# Patient Record
Sex: Female | Born: 1965
Health system: Southern US, Community
[De-identification: ages and names within clinical notes are randomized; demographics above are authoritative.]

## PROBLEM LIST (undated history)

## (undated) DIAGNOSIS — E785 Hyperlipidemia, unspecified: Secondary | ICD-10-CM

## (undated) DIAGNOSIS — M1990 Unspecified osteoarthritis, unspecified site: Secondary | ICD-10-CM

## (undated) DIAGNOSIS — E039 Hypothyroidism, unspecified: Secondary | ICD-10-CM

## (undated) DIAGNOSIS — L409 Psoriasis, unspecified: Secondary | ICD-10-CM

## (undated) DIAGNOSIS — N920 Excessive and frequent menstruation with regular cycle: Secondary | ICD-10-CM

## (undated) DIAGNOSIS — K219 Gastro-esophageal reflux disease without esophagitis: Secondary | ICD-10-CM

## (undated) DIAGNOSIS — K589 Irritable bowel syndrome without diarrhea: Secondary | ICD-10-CM

## (undated) DIAGNOSIS — F329 Major depressive disorder, single episode, unspecified: Secondary | ICD-10-CM

## (undated) DIAGNOSIS — G43909 Migraine, unspecified, not intractable, without status migrainosus: Secondary | ICD-10-CM

## (undated) DIAGNOSIS — E119 Type 2 diabetes mellitus without complications: Secondary | ICD-10-CM

## (undated) DIAGNOSIS — F419 Anxiety disorder, unspecified: Secondary | ICD-10-CM

## (undated) DIAGNOSIS — I10 Essential (primary) hypertension: Secondary | ICD-10-CM

## (undated) DIAGNOSIS — F32A Depression, unspecified: Secondary | ICD-10-CM

## (undated) HISTORY — PX: JOINT REPLACEMENT: SHX530

## (undated) HISTORY — PX: KNEE ARTHROSCOPY: SHX127

## (undated) HISTORY — PX: KNEE ARTHROPLASTY: SHX992

## (undated) HISTORY — DX: Hyperlipidemia, unspecified: E78.5

## (undated) HISTORY — DX: Psoriasis, unspecified: L40.9

## (undated) HISTORY — DX: Essential (primary) hypertension: I10

---
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
## 1998-01-08 ENCOUNTER — Encounter: Admission: RE | Admit: 1998-01-08 | Discharge: 1998-01-08 | Payer: Self-pay | Admitting: Hematology and Oncology

## 1998-02-24 ENCOUNTER — Encounter: Admission: RE | Admit: 1998-02-24 | Discharge: 1998-02-24 | Payer: Self-pay | Admitting: Obstetrics & Gynecology

## 1998-06-16 ENCOUNTER — Encounter: Admission: RE | Admit: 1998-06-16 | Discharge: 1998-06-16 | Payer: Self-pay | Admitting: Hematology and Oncology

## 1998-06-26 ENCOUNTER — Encounter: Admission: RE | Admit: 1998-06-26 | Discharge: 1998-06-26 | Payer: Self-pay | Admitting: Internal Medicine

## 1998-08-25 ENCOUNTER — Encounter: Admission: RE | Admit: 1998-08-25 | Discharge: 1998-08-25 | Payer: Self-pay | Admitting: Internal Medicine

## 1998-11-04 ENCOUNTER — Encounter: Admission: RE | Admit: 1998-11-04 | Discharge: 1998-11-04 | Payer: Self-pay | Admitting: Internal Medicine

## 1998-11-18 ENCOUNTER — Encounter: Admission: RE | Admit: 1998-11-18 | Discharge: 1998-11-18 | Payer: Self-pay | Admitting: Internal Medicine

## 1999-01-27 ENCOUNTER — Encounter: Admission: RE | Admit: 1999-01-27 | Discharge: 1999-01-27 | Payer: Self-pay | Admitting: Obstetrics

## 1999-07-13 ENCOUNTER — Encounter: Admission: RE | Admit: 1999-07-13 | Discharge: 1999-07-13 | Payer: Self-pay | Admitting: Internal Medicine

## 1999-08-19 ENCOUNTER — Encounter: Admission: RE | Admit: 1999-08-19 | Discharge: 1999-08-19 | Payer: Self-pay | Admitting: Internal Medicine

## 1999-08-19 ENCOUNTER — Ambulatory Visit (HOSPITAL_COMMUNITY): Admission: RE | Admit: 1999-08-19 | Discharge: 1999-08-19 | Payer: Self-pay | Admitting: Internal Medicine

## 1999-08-19 ENCOUNTER — Encounter: Payer: Self-pay | Admitting: Internal Medicine

## 1999-11-12 ENCOUNTER — Encounter: Admission: RE | Admit: 1999-11-12 | Discharge: 1999-11-12 | Payer: Self-pay | Admitting: Internal Medicine

## 2000-06-19 ENCOUNTER — Encounter: Admission: RE | Admit: 2000-06-19 | Discharge: 2000-06-19 | Payer: Self-pay | Admitting: Internal Medicine

## 2000-06-26 ENCOUNTER — Inpatient Hospital Stay (HOSPITAL_COMMUNITY): Admission: AD | Admit: 2000-06-26 | Discharge: 2000-06-26 | Payer: Self-pay | Admitting: Obstetrics

## 2001-02-01 ENCOUNTER — Encounter: Admission: RE | Admit: 2001-02-01 | Discharge: 2001-03-13 | Payer: Self-pay | Admitting: Orthopedic Surgery

## 2001-11-26 ENCOUNTER — Other Ambulatory Visit: Admission: RE | Admit: 2001-11-26 | Discharge: 2001-11-26 | Payer: Self-pay | Admitting: Obstetrics and Gynecology

## 2002-12-09 ENCOUNTER — Other Ambulatory Visit: Admission: RE | Admit: 2002-12-09 | Discharge: 2002-12-09 | Payer: Self-pay | Admitting: Obstetrics and Gynecology

## 2002-12-27 ENCOUNTER — Encounter: Admission: RE | Admit: 2002-12-27 | Discharge: 2002-12-27 | Payer: Self-pay | Admitting: Gastroenterology

## 2002-12-27 ENCOUNTER — Encounter: Payer: Self-pay | Admitting: Gastroenterology

## 2005-04-18 ENCOUNTER — Other Ambulatory Visit: Admission: RE | Admit: 2005-04-18 | Discharge: 2005-04-18 | Payer: Self-pay | Admitting: Obstetrics and Gynecology

## 2006-03-24 ENCOUNTER — Ambulatory Visit (HOSPITAL_COMMUNITY): Admission: RE | Admit: 2006-03-24 | Discharge: 2006-03-24 | Payer: Self-pay | Admitting: Gastroenterology

## 2006-05-01 ENCOUNTER — Ambulatory Visit (HOSPITAL_COMMUNITY): Admission: RE | Admit: 2006-05-01 | Discharge: 2006-05-01 | Payer: Self-pay | Admitting: Gastroenterology

## 2006-05-22 ENCOUNTER — Other Ambulatory Visit: Admission: RE | Admit: 2006-05-22 | Discharge: 2006-05-22 | Payer: Self-pay | Admitting: Obstetrics and Gynecology

## 2008-03-03 ENCOUNTER — Encounter: Admission: RE | Admit: 2008-03-03 | Discharge: 2008-03-03 | Payer: Self-pay | Admitting: Internal Medicine

## 2008-08-08 ENCOUNTER — Encounter: Admission: RE | Admit: 2008-08-08 | Discharge: 2008-08-08 | Payer: Self-pay | Admitting: Internal Medicine

## 2009-11-11 ENCOUNTER — Emergency Department (HOSPITAL_COMMUNITY): Admission: EM | Admit: 2009-11-11 | Discharge: 2009-11-11 | Payer: Self-pay | Admitting: Emergency Medicine

## 2010-12-23 LAB — POCT I-STAT, CHEM 8
Calcium, Ion: 1 mmol/L — ABNORMAL LOW (ref 1.12–1.32)
Creatinine, Ser: 0.8 mg/dL (ref 0.4–1.2)
Glucose, Bld: 112 mg/dL — ABNORMAL HIGH (ref 70–99)
HCT: 38 % (ref 36.0–46.0)
Hemoglobin: 12.9 g/dL (ref 12.0–15.0)
TCO2: 24 mmol/L (ref 0–100)

## 2010-12-23 LAB — COMPREHENSIVE METABOLIC PANEL
AST: 36 U/L (ref 0–37)
Albumin: 3.8 g/dL (ref 3.5–5.2)
Alkaline Phosphatase: 79 U/L (ref 39–117)
BUN: 11 mg/dL (ref 6–23)
CO2: 25 mEq/L (ref 19–32)
Chloride: 103 mEq/L (ref 96–112)
Creatinine, Ser: 0.74 mg/dL (ref 0.4–1.2)
GFR calc Af Amer: >60 mL/min (ref >60)
GFR calc non Af Amer: >60 mL/min (ref >60)
Potassium: 3.8 mEq/L (ref 3.5–5.1)
Total Bilirubin: 0.5 mg/dL (ref 0.3–1.2)

## 2010-12-23 LAB — CBC
HCT: 38.1 % (ref 36.0–46.0)
MCV: 92.8 fL (ref 78.0–100.0)
Platelets: 286 10*3/uL (ref 150–400)
RBC: 4.1 MIL/uL (ref 3.87–5.11)
WBC: 6.6 10*3/uL (ref 4.0–10.5)

## 2010-12-23 LAB — POCT CARDIAC MARKERS
CKMB, poc: <1 ng/mL — ABNORMAL LOW (ref 1.0–8.0)
CKMB, poc: <1 ng/mL — ABNORMAL LOW (ref 1.0–8.0)
Myoglobin, poc: 74.5 ng/mL (ref 12–200)
Troponin i, poc: <0.05 ng/mL (ref 0.00–0.09)

## 2010-12-23 LAB — DIFFERENTIAL
Basophils Absolute: 0 10*3/uL (ref 0.0–0.1)
Basophils Relative: 0 % (ref 0–1)
Eosinophils Relative: 2 % (ref 0–5)
Lymphocytes Relative: 27 % (ref 12–46)
Monocytes Absolute: 0.4 10*3/uL (ref 0.1–1.0)

## 2010-12-23 LAB — LIPASE, BLOOD: Lipase: 33 U/L (ref 11–59)

## 2011-02-18 NOTE — Op Note (Signed)
NAMEMANETTE, DOTO            ACCOUNT NO.:  [id number]   MEDICAL RECORD NO.:  [id number]          PATIENT TYPE:  AMB   LOCATION:  ENDO                         FACILITY:  [facility]   PHYSICIAN:  Anselmo Rod, M.D.  DATE OF BIRTH:  1966-01-22   DATE OF PROCEDURE:  05/01/2006  DATE OF DISCHARGE:                                 OPERATIVE REPORT   OPERATIVE REPORT:   PROCEDURE PERFORMED:  Screening colonoscopy.   ENDOSCOPIST:  Anselmo Rod, MD   INSTRUMENTS USED:  Olympus video colonoscope.   INDICATION FOR PROCEDURE:  This is a 45 year old white female with a history  of rectal bleeding, rule out colonic polyps, masses, etc.   PREPROCEDURE PREPARATION:  Informed consent was retrieved from the patient.  The patient fasted for 4 hours prior to procedure after being prepped with  32 pills of OsmoPrep the night of and the morning of the procedure.  Risks  and benefits of the procedure, including a 10% missed rate of cancer and  polyp, were discussed with the patient, as well.   BRIEF HISTORY AND PHYSICAL:  VITAL SIGNS:  The patient has stable vital  signs.  NECK:  Supple.  CHEST:  Clear to auscultation.  S1, S2 regular.  ABDOMEN:  Soft with normal bowel sounds.   DESCRIPTION OF PROCEDURE:  The patient was placed in the left lateral  decubitus position, sedated with 150 mcg of fentanyl and 12.5 mg of Versed  in slow, incremental doses intravenously.  Once the patient was adequately  sedated and maintained on low-flow oxygen and continuous cardiac monitoring,  the Olympus video colonoscope was advanced from the rectum to the cecum.  There was some residual stool in the colon.  Multiple washes were done.  The  appendiceal orifice and ileocecal valve were clearly visualized and  photographed.  No masses, polyps, erosions, ulcerations or diverticula were  seen.  There was no evidence of hemorrhoids seen on retroflexion in the  rectum.  There was no evidence of an anal fissure.  No  definite source of  bleeding could be identified.  The terminal ileum appeared healthy and  without lesion.   IMPRESSION:  1.Normal colonoscopy of the terminal ileum.  No masses, polyps,  erosions, ulcerations or diverticula seen.  2.The patient had some abdominal discomfort with insufflation of air into  the colon indicating a component of viceral hypersensitivity, most  consistent with IBS.   RECOMMENDATIONS:  1.Continue on high-fiber diet, with liberal fluid intake.  2.The patient to follow up in the next 2 weeks for repeat guaiac testing.  Further recommendation will be made thereafter.      Anselmo Rod, M.D.  Electronically Signed     JNM/MEDQ  D:  05/01/2006  T:  05/02/2006  Job:  [id number]   cc:   Gabriel Earing, M.D.

## 2013-02-18 ENCOUNTER — Other Ambulatory Visit: Payer: Self-pay | Admitting: Orthopedic Surgery

## 2013-02-19 ENCOUNTER — Encounter (HOSPITAL_COMMUNITY)
Admission: RE | Admit: 2013-02-19 | Discharge: 2013-02-19 | Disposition: A | Discharge status: Home / Self Care | Payer: 59 | Source: Ambulatory Visit | Attending: Orthopedic Surgery | Admitting: Orthopedic Surgery

## 2013-02-19 ENCOUNTER — Ambulatory Visit (HOSPITAL_COMMUNITY)
Admission: RE | Admit: 2013-02-19 | Discharge: 2013-02-19 | Disposition: A | Discharge status: Home / Self Care | Payer: 59 | Source: Ambulatory Visit | Attending: Orthopedic Surgery | Admitting: Orthopedic Surgery

## 2013-02-19 ENCOUNTER — Encounter (HOSPITAL_COMMUNITY): Payer: Self-pay

## 2013-02-19 ENCOUNTER — Encounter (HOSPITAL_COMMUNITY): Payer: Self-pay | Admitting: Pharmacy Technician

## 2013-02-19 DIAGNOSIS — Z01818 Encounter for other preprocedural examination: Secondary | ICD-10-CM | POA: Insufficient documentation

## 2013-02-19 HISTORY — DX: Gastro-esophageal reflux disease without esophagitis: K21.9

## 2013-02-19 HISTORY — DX: Anxiety disorder, unspecified: F41.9

## 2013-02-19 HISTORY — DX: Irritable bowel syndrome, unspecified: K58.9

## 2013-02-19 HISTORY — DX: Unspecified osteoarthritis, unspecified site: M19.90

## 2013-02-19 HISTORY — DX: Excessive and frequent menstruation with regular cycle: N92.0

## 2013-02-19 LAB — URINALYSIS, ROUTINE W REFLEX MICROSCOPIC
Glucose, UA: NEGATIVE mg/dL
Leukocytes, UA: NEGATIVE
Protein, ur: NEGATIVE mg/dL
Specific Gravity, Urine: 1.022 (ref 1.005–1.030)

## 2013-02-19 LAB — SURGICAL PCR SCREEN
MRSA, PCR: NEGATIVE
Staphylococcus aureus: POSITIVE — AB

## 2013-02-19 LAB — TYPE AND SCREEN: ABO/RH(D): O NEG

## 2013-02-19 LAB — BASIC METABOLIC PANEL
BUN: 14 mg/dL (ref 6–23)
Calcium: 9.6 mg/dL (ref 8.4–10.5)
Chloride: 100 mEq/L (ref 96–112)
Creatinine, Ser: 0.71 mg/dL (ref 0.50–1.10)
GFR calc Af Amer: >90 mL/min (ref >90)

## 2013-02-19 LAB — CBC
HCT: 41.8 % (ref 36.0–46.0)
MCHC: 35.4 g/dL (ref 30.0–36.0)
Platelets: 283 10*3/uL (ref 150–400)
RDW: 13.2 % (ref 11.5–15.5)

## 2013-02-19 NOTE — Pre-Procedure Instructions (Addendum)
Maria Hancock  02/19/2013   Your procedure is scheduled on:  Tuesday, May 27th  Report to [facility] at 9:00 AM.  Call this number if you have problems the morning of surgery: [phone]   Remember:   Do not eat food or drink liquids after midnight.   Take these medicines the morning of surgery with A SIP OF WATER: - Zoloft.  May take Flexeril and Oxycodone if needed.   Do not wear jewelry, make-up or nail polish.  Do not wear lotions, powders, or perfumes. You may wear deodorant.  Do not shave 48 hours prior to surgery.   Do not bring valuables to the hospital.  Contacts, dentures or bridgework may not be worn into surgery.  Leave suitcase in the car. After surgery it may be brought to your room.  For patients admitted to the hospital, checkout time is 11:00 AM the day of discharge.   Special Instructions: Shower using CHG 2 nights before surgery and the night before surgery.  If you shower the day of surgery use CHG.  Use special wash - you have one bottle of CHG for all showers.  You should use approximately 1/3 of the bottle for each shower.   Please read over the following fact sheets that you were given: Pain Booklet, Coughing and Deep Breathing, Blood Transfusion Information and Surgical Site Infection Prevention

## 2013-02-20 LAB — URINE CULTURE: Colony Count: NO GROWTH

## 2013-02-24 NOTE — H&P (Signed)
TOTAL KNEE ADMISSION H&P  Patient is being admitted for left total knee arthroplasty.  Subjective:  Chief Complaint:left knee pain.  HPI: Maria Hancock, 47 y.o. female, has a history of pain and functional disability in the left knee due to arthritis and has failed non-surgical conservative treatments for greater than 12 weeks to includeNSAID's and/or analgesics, corticosteriod injections, flexibility and strengthening excercises, use of assistive devices, weight reduction as appropriate and activity modification.  Onset of symptoms was gradual, starting 8 years ago with gradually worsening course since that time. The patient noted prior procedures on the knee to include  arthroscopy and ACL reconstruction on the left knee(s).  Patient currently rates pain in the left knee(s) at 9 out of 10 with activity. Patient has night pain, worsening of pain with activity and weight bearing, pain that interferes with activities of daily living, pain with passive range of motion, crepitus and joint swelling.  Patient has evidence of subchondral cysts, subchondral sclerosis and joint space narrowing by imaging studies. 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 There is no active infection.  There are no active problems to display for this patient.  Past Medical History  Diagnosis Date  . Anxiety   . GERD (gastroesophageal reflux disease)     with Mobic  . IBS (irritable bowel syndrome)   . Arthritis   . Menorrhagia     Past Surgical History  Procedure Laterality Date  . Joint replacement Left     HJardware placed.  Arhtroscopy x2  . Tubal ligation  1988    No prescriptions prior to admission   No Known Allergies  History  Substance Use Topics  . Smoking status: Former Smoker -- 15 years  . Smokeless tobacco: Not on file     Comment: quit in 1997  . Alcohol Use: No    No family history on file.   Review of Systems   Constitutional: Negative.   HENT: Negative.   Eyes: Negative.   Respiratory: Negative.   Cardiovascular: Negative.   Gastrointestinal: Negative.   Genitourinary: Negative.   Musculoskeletal: Positive for joint pain.  Skin: Negative.   Neurological: Negative.   Endo/Heme/Allergies: Negative.   Psychiatric/Behavioral: Negative.     Objective:  Physical Exam  Constitutional: She appears well-developed.  HENT:  Head: Normocephalic.  Eyes: Pupils are equal, round, and reactive to light.  Neck: Normal range of motion.  Cardiovascular: Normal rate.   Respiratory: Effort normal.  GI: Soft.  Neurological: She is alert.  Skin: Skin is warm.  Psychiatric: She has a normal mood and affect.  left knee shows intact skin with well healed incisions stable collaterals - dp 2/4 - df/pf ok - valgus alignment - rom 0 - 95  Vital signs in last 24 hours:    Labs:   There is no height or weight on file to calculate BMI.   Imaging Review Plain radiographs demonstrate severe degenerative joint disease of the left knee(s). The overall alignment ismild valgus. The bone quality appears to be good for age and reported activity level.  Assessment/Plan:  End stage arthritis, left knee   The patient history, physical examination, clinical judgment of the provider and imaging studies are consistent with end stage degenerative joint disease of the left knee(s) and total knee arthroplasty is deemed medically necessary. The treatment options including medical management, injection therapy arthroscopy and arthroplasty were discussed at length. The risks and benefits of total knee arthroplasty were  presented and reviewed. The risks due to aseptic loosening, infection, stiffness, patella tracking problems, thromboembolic complications and other imponderables were discussed. The patient acknowledged the explanation, agreed to proceed with the plan and consent was signed. Patient is being admitted for  inpatient treatment for surgery, pain control, PT, OT, prophylactic antibiotics, VTE prophylaxis, progressive ambulation and ADL's and discharge planning. The patient is planning to be discharged home with home health services

## 2013-02-25 MED ORDER — DEXTROSE 5 % IV SOLN
3.0000 g | INTRAVENOUS | Status: AC
  Administered 2013-02-26: 3 g via INTRAVENOUS
  Filled 2013-02-25: qty 3000

## 2013-02-26 ENCOUNTER — Encounter (HOSPITAL_COMMUNITY)
Admission: RE | Disposition: A | Discharge status: Home / Self Care | Payer: Self-pay | Source: Ambulatory Visit | Attending: Orthopedic Surgery

## 2013-02-26 ENCOUNTER — Inpatient Hospital Stay (HOSPITAL_COMMUNITY)
Admission: RE | Admit: 2013-02-26 | Discharge: 2013-03-02 | DRG: 470 | Disposition: A | Discharge status: Home / Self Care | Payer: 59 | Source: Ambulatory Visit | Attending: Orthopedic Surgery | Admitting: Orthopedic Surgery

## 2013-02-26 ENCOUNTER — Inpatient Hospital Stay (HOSPITAL_COMMUNITY): Payer: 59 | Admitting: Anesthesiology

## 2013-02-26 ENCOUNTER — Encounter (HOSPITAL_COMMUNITY): Payer: Self-pay | Admitting: *Deleted

## 2013-02-26 ENCOUNTER — Encounter (HOSPITAL_COMMUNITY): Payer: Self-pay | Admitting: Anesthesiology

## 2013-02-26 DIAGNOSIS — K219 Gastro-esophageal reflux disease without esophagitis: Secondary | ICD-10-CM | POA: Diagnosis present

## 2013-02-26 DIAGNOSIS — M1712 Unilateral primary osteoarthritis, left knee: Secondary | ICD-10-CM | POA: Diagnosis present

## 2013-02-26 DIAGNOSIS — T85695A Other mechanical complication of other nervous system device, implant or graft, initial encounter: Secondary | ICD-10-CM | POA: Diagnosis present

## 2013-02-26 DIAGNOSIS — M1710 Unilateral primary osteoarthritis, unspecified knee: Principal | ICD-10-CM | POA: Diagnosis present

## 2013-02-26 DIAGNOSIS — F411 Generalized anxiety disorder: Secondary | ICD-10-CM | POA: Diagnosis present

## 2013-02-26 DIAGNOSIS — IMO0002 Reserved for concepts with insufficient information to code with codable children: Secondary | ICD-10-CM

## 2013-02-26 DIAGNOSIS — N920 Excessive and frequent menstruation with regular cycle: Secondary | ICD-10-CM | POA: Diagnosis present

## 2013-02-26 DIAGNOSIS — Y831 Surgical operation with implant of artificial internal device as the cause of abnormal reaction of the patient, or of later complication, without mention of misadventure at the time of the procedure: Secondary | ICD-10-CM | POA: Diagnosis present

## 2013-02-26 DIAGNOSIS — K589 Irritable bowel syndrome without diarrhea: Secondary | ICD-10-CM | POA: Diagnosis present

## 2013-02-26 DIAGNOSIS — Z87891 Personal history of nicotine dependence: Secondary | ICD-10-CM

## 2013-02-26 DIAGNOSIS — Y929 Unspecified place or not applicable: Secondary | ICD-10-CM

## 2013-02-26 HISTORY — PX: REPLACEMENT TOTAL KNEE: SUR1224

## 2013-02-26 HISTORY — PX: TOTAL KNEE ARTHROPLASTY: SHX125

## 2013-02-26 HISTORY — PX: HARDWARE REMOVAL: SHX979

## 2013-02-26 LAB — HCG, SERUM, QUALITATIVE: Preg, Serum: NEGATIVE

## 2013-02-26 SURGERY — ARTHROPLASTY, KNEE, TOTAL
Anesthesia: General | Site: Knee | Laterality: Left | Wound class: Clean

## 2013-02-26 MED ORDER — SODIUM CHLORIDE 0.9 % IR SOLN
Status: DC | PRN
  Administered 2013-02-26: 3000 mL

## 2013-02-26 MED ORDER — WARFARIN SODIUM 7.5 MG PO TABS
7.5000 mg | ORAL_TABLET | Freq: Once | ORAL | Status: AC
  Administered 2013-02-26: 7.5 mg via ORAL
  Filled 2013-02-26: qty 1

## 2013-02-26 MED ORDER — ONDANSETRON HCL 4 MG/2ML IJ SOLN
4.0000 mg | Freq: Four times a day (QID) | INTRAMUSCULAR | Status: DC | PRN
  Administered 2013-02-26: 4 mg via INTRAVENOUS
  Filled 2013-02-26: qty 2

## 2013-02-26 MED ORDER — METOCLOPRAMIDE HCL 5 MG/ML IJ SOLN: 5.0000 mg | Freq: Three times a day (TID) | INTRAMUSCULAR | Status: DC | PRN

## 2013-02-26 MED ORDER — BUPIVACAINE-EPINEPHRINE PF 0.25-1:200000 % IJ SOLN
INTRAMUSCULAR | Status: AC
  Filled 2013-02-26: qty 30

## 2013-02-26 MED ORDER — 0.9 % SODIUM CHLORIDE (POUR BTL) OPTIME
TOPICAL | Status: DC | PRN
  Administered 2013-02-26: 1000 mL

## 2013-02-26 MED ORDER — ZOLPIDEM TARTRATE 5 MG PO TABS: 5.0000 mg | ORAL_TABLET | Freq: Every evening | ORAL | Status: DC | PRN

## 2013-02-26 MED ORDER — POLYETHYLENE GLYCOL 3350 17 G PO PACK: 17.0000 g | PACK | Freq: Every day | ORAL | Status: DC | PRN

## 2013-02-26 MED ORDER — DIPHENHYDRAMINE HCL 12.5 MG/5ML PO ELIX: 12.5000 mg | ORAL_SOLUTION | ORAL | Status: DC | PRN

## 2013-02-26 MED ORDER — CLONIDINE HCL (ANALGESIA) 100 MCG/ML EP SOLN
EPIDURAL | Status: DC | PRN
  Administered 2013-02-26: 100 ug via INTRA_ARTICULAR

## 2013-02-26 MED ORDER — DEXTROSE 5 % IV SOLN
500.0000 mg | Freq: Four times a day (QID) | INTRAVENOUS | Status: DC | PRN
  Filled 2013-02-26: qty 5

## 2013-02-26 MED ORDER — ONDANSETRON HCL 4 MG/2ML IJ SOLN: 4.0000 mg | Freq: Four times a day (QID) | INTRAMUSCULAR | Status: DC | PRN

## 2013-02-26 MED ORDER — PHENOL 1.4 % MT LIQD: 1.0000 | OROMUCOSAL | Status: DC | PRN

## 2013-02-26 MED ORDER — OXYCODONE HCL 5 MG PO TABS
ORAL_TABLET | ORAL | Status: AC
  Administered 2013-02-26: 5 mg
  Filled 2013-02-26: qty 1

## 2013-02-26 MED ORDER — METHOCARBAMOL 500 MG PO TABS
500.0000 mg | ORAL_TABLET | Freq: Four times a day (QID) | ORAL | Status: DC | PRN
  Administered 2013-02-26 – 2013-03-02 (×12): 500 mg via ORAL
  Filled 2013-02-26 (×11): qty 1

## 2013-02-26 MED ORDER — HYDROMORPHONE HCL PF 1 MG/ML IJ SOLN
0.2500 mg | INTRAMUSCULAR | Status: DC | PRN
  Administered 2013-02-26: 0.5 mg via INTRAVENOUS

## 2013-02-26 MED ORDER — WARFARIN VIDEO: Freq: Once | Status: DC

## 2013-02-26 MED ORDER — WARFARIN - PHARMACIST DOSING INPATIENT: Freq: Every day | Status: DC

## 2013-02-26 MED ORDER — CLONIDINE HCL (ANALGESIA) 100 MCG/ML EP SOLN
150.0000 ug | Freq: Once | EPIDURAL | Status: DC
  Filled 2013-02-26: qty 1.5

## 2013-02-26 MED ORDER — ARTIFICIAL TEARS OP OINT
TOPICAL_OINTMENT | OPHTHALMIC | Status: DC | PRN
  Administered 2013-02-26: 1 via OPHTHALMIC

## 2013-02-26 MED ORDER — ACETAMINOPHEN 325 MG PO TABS: 650.0000 mg | ORAL_TABLET | Freq: Four times a day (QID) | ORAL | Status: DC | PRN

## 2013-02-26 MED ORDER — HYDROMORPHONE HCL PF 1 MG/ML IJ SOLN
INTRAMUSCULAR | Status: AC
  Filled 2013-02-26: qty 1

## 2013-02-26 MED ORDER — SENNOSIDES-DOCUSATE SODIUM 8.6-50 MG PO TABS: 1.0000 | ORAL_TABLET | Freq: Every evening | ORAL | Status: DC | PRN

## 2013-02-26 MED ORDER — NALOXONE HCL 0.4 MG/ML IJ SOLN: 0.4000 mg | INTRAMUSCULAR | Status: DC | PRN

## 2013-02-26 MED ORDER — BUPIVACAINE HCL (PF) 0.25 % IJ SOLN
INTRAMUSCULAR | Status: AC
  Filled 2013-02-26: qty 30

## 2013-02-26 MED ORDER — SODIUM CHLORIDE 0.9 % IJ SOLN: 9.0000 mL | INTRAMUSCULAR | Status: DC | PRN

## 2013-02-26 MED ORDER — COUMADIN BOOK
Freq: Once | Status: AC
  Administered 2013-02-26: 19:00:00
  Filled 2013-02-26: qty 1

## 2013-02-26 MED ORDER — MIDAZOLAM HCL 5 MG/5ML IJ SOLN
INTRAMUSCULAR | Status: DC | PRN
  Administered 2013-02-26: 2 mg via INTRAVENOUS

## 2013-02-26 MED ORDER — ACETAMINOPHEN 650 MG RE SUPP: 650.0000 mg | Freq: Four times a day (QID) | RECTAL | Status: DC | PRN

## 2013-02-26 MED ORDER — MORPHINE SULFATE (PF) 1 MG/ML IV SOLN
INTRAVENOUS | Status: DC
  Administered 2013-02-26: via INTRAVENOUS
  Administered 2013-02-26: 1.5 mg via INTRAVENOUS
  Administered 2013-02-26: 12 mg via INTRAVENOUS
  Administered 2013-02-27: 10.5 mg via INTRAVENOUS
  Administered 2013-02-27: 05:00:00 via INTRAVENOUS
  Administered 2013-02-27: 16.5 mg via INTRAVENOUS
  Filled 2013-02-26 (×2): qty 25

## 2013-02-26 MED ORDER — ONDANSETRON HCL 4 MG/2ML IJ SOLN
INTRAMUSCULAR | Status: DC | PRN
  Administered 2013-02-26: 4 mg via INTRAVENOUS

## 2013-02-26 MED ORDER — ONDANSETRON HCL 4 MG/2ML IJ SOLN: 4.0000 mg | Freq: Once | INTRAMUSCULAR | Status: DC | PRN

## 2013-02-26 MED ORDER — LACTATED RINGERS IV SOLN
INTRAVENOUS | Status: DC
  Administered 2013-02-26 (×2): via INTRAVENOUS

## 2013-02-26 MED ORDER — OXYCODONE HCL 5 MG PO TABS: 5.0000 mg | ORAL_TABLET | Freq: Once | ORAL | Status: DC

## 2013-02-26 MED ORDER — LACTATED RINGERS IV SOLN
INTRAVENOUS | Status: DC | PRN
  Administered 2013-02-26 (×3): via INTRAVENOUS

## 2013-02-26 MED ORDER — METOCLOPRAMIDE HCL 10 MG PO TABS: 5.0000 mg | ORAL_TABLET | Freq: Three times a day (TID) | ORAL | Status: DC | PRN

## 2013-02-26 MED ORDER — GLYCOPYRROLATE 0.2 MG/ML IJ SOLN
INTRAMUSCULAR | Status: DC | PRN
  Administered 2013-02-26: 0.6 mg via INTRAVENOUS

## 2013-02-26 MED ORDER — MORPHINE SULFATE 4 MG/ML IJ SOLN
INTRAMUSCULAR | Status: AC
  Filled 2013-02-26: qty 2

## 2013-02-26 MED ORDER — LIDOCAINE HCL 4 % MT SOLN
OROMUCOSAL | Status: DC | PRN
  Administered 2013-02-26: 4 mL via TOPICAL

## 2013-02-26 MED ORDER — FENTANYL CITRATE 0.05 MG/ML IJ SOLN
INTRAMUSCULAR | Status: DC | PRN
  Administered 2013-02-26: 100 ug via INTRAVENOUS
  Administered 2013-02-26: 50 ug via INTRAVENOUS
  Administered 2013-02-26 (×3): 100 ug via INTRAVENOUS
  Administered 2013-02-26: 50 ug via INTRAVENOUS

## 2013-02-26 MED ORDER — SERTRALINE HCL 100 MG PO TABS
100.0000 mg | ORAL_TABLET | Freq: Every day | ORAL | Status: DC
  Administered 2013-02-26 – 2013-03-02 (×5): 100 mg via ORAL
  Filled 2013-02-26 (×5): qty 1

## 2013-02-26 MED ORDER — LABETALOL HCL 5 MG/ML IV SOLN
INTRAVENOUS | Status: DC | PRN
  Administered 2013-02-26 (×4): 5 mg via INTRAVENOUS

## 2013-02-26 MED ORDER — DIPHENHYDRAMINE HCL 12.5 MG/5ML PO ELIX: 12.5000 mg | ORAL_SOLUTION | Freq: Four times a day (QID) | ORAL | Status: DC | PRN

## 2013-02-26 MED ORDER — CEFAZOLIN SODIUM-DEXTROSE 2-3 GM-% IV SOLR
2.0000 g | Freq: Four times a day (QID) | INTRAVENOUS | Status: AC
  Administered 2013-02-26 (×2): 2 g via INTRAVENOUS
  Filled 2013-02-26 (×2): qty 50

## 2013-02-26 MED ORDER — MORPHINE SULFATE 4 MG/ML IJ SOLN
INTRAMUSCULAR | Status: DC | PRN
  Administered 2013-02-26: 8 mg via INTRAVENOUS

## 2013-02-26 MED ORDER — MENTHOL 3 MG MT LOZG: 1.0000 | LOZENGE | OROMUCOSAL | Status: DC | PRN

## 2013-02-26 MED ORDER — NEOSTIGMINE METHYLSULFATE 1 MG/ML IJ SOLN
INTRAMUSCULAR | Status: DC | PRN
  Administered 2013-02-26: 4 mg via INTRAVENOUS

## 2013-02-26 MED ORDER — ONDANSETRON HCL 4 MG PO TABS: 4.0000 mg | ORAL_TABLET | Freq: Four times a day (QID) | ORAL | Status: DC | PRN

## 2013-02-26 MED ORDER — OXYCODONE HCL 5 MG PO TABS
5.0000 mg | ORAL_TABLET | ORAL | Status: DC | PRN
  Administered 2013-02-27 – 2013-03-02 (×21): 10 mg via ORAL
  Filled 2013-02-26 (×21): qty 2

## 2013-02-26 MED ORDER — FLEET ENEMA 7-19 GM/118ML RE ENEM: 1.0000 | ENEMA | Freq: Once | RECTAL | Status: AC | PRN

## 2013-02-26 MED ORDER — BUPIVACAINE HCL (PF) 0.25 % IJ SOLN
INTRAMUSCULAR | Status: DC | PRN
  Administered 2013-02-26: 30 mL via INTRA_ARTICULAR

## 2013-02-26 MED ORDER — LIDOCAINE HCL (CARDIAC) 20 MG/ML IV SOLN
INTRAVENOUS | Status: DC | PRN
  Administered 2013-02-26 (×2): 50 mg via INTRAVENOUS

## 2013-02-26 MED ORDER — BISACODYL 10 MG RE SUPP: 10.0000 mg | Freq: Every day | RECTAL | Status: DC | PRN

## 2013-02-26 MED ORDER — CHLORHEXIDINE GLUCONATE 4 % EX LIQD: 60.0000 mL | Freq: Once | CUTANEOUS | Status: DC

## 2013-02-26 MED ORDER — DOCUSATE SODIUM 100 MG PO CAPS
100.0000 mg | ORAL_CAPSULE | Freq: Two times a day (BID) | ORAL | Status: DC
  Administered 2013-02-26 – 2013-03-02 (×8): 100 mg via ORAL
  Filled 2013-02-26 (×9): qty 1

## 2013-02-26 MED ORDER — MORPHINE SULFATE (PF) 1 MG/ML IV SOLN
INTRAVENOUS | Status: AC
  Administered 2013-02-26: 15:00:00
  Filled 2013-02-26: qty 25

## 2013-02-26 MED ORDER — METHOCARBAMOL 500 MG PO TABS
ORAL_TABLET | ORAL | Status: AC
  Filled 2013-02-26: qty 1

## 2013-02-26 MED ORDER — ROCURONIUM BROMIDE 100 MG/10ML IV SOLN
INTRAVENOUS | Status: DC | PRN
  Administered 2013-02-26 (×2): 10 mg via INTRAVENOUS
  Administered 2013-02-26: 50 mg via INTRAVENOUS
  Administered 2013-02-26: 10 mg via INTRAVENOUS

## 2013-02-26 MED ORDER — PROPOFOL 10 MG/ML IV BOLUS
INTRAVENOUS | Status: DC | PRN
  Administered 2013-02-26: 200 mg via INTRAVENOUS

## 2013-02-26 MED ORDER — DIPHENHYDRAMINE HCL 50 MG/ML IJ SOLN: 12.5000 mg | Freq: Four times a day (QID) | INTRAMUSCULAR | Status: DC | PRN

## 2013-02-26 SURGICAL SUPPLY — 90 items
BANDAGE ELASTIC 4 VELCRO ST LF (GAUZE/BANDAGES/DRESSINGS) ×2 IMPLANT
BANDAGE ELASTIC 6 VELCRO ST LF (GAUZE/BANDAGES/DRESSINGS) ×2 IMPLANT
BANDAGE ESMARK 6X9 LF (GAUZE/BANDAGES/DRESSINGS) ×1 IMPLANT
BANDAGE GAUZE ELAST BULKY 4 IN (GAUZE/BANDAGES/DRESSINGS) ×2 IMPLANT
BLADE SAG 18X100X1.27 (BLADE) ×2 IMPLANT
BLADE SAW SGTL 13.0X1.19X90.0M (BLADE) ×2 IMPLANT
BNDG CMPR 9X6 STRL LF SNTH (GAUZE/BANDAGES/DRESSINGS) ×1
BNDG CMPR MED 10X6 ELC LF (GAUZE/BANDAGES/DRESSINGS) ×3
BNDG COHESIVE 4X5 TAN STRL (GAUZE/BANDAGES/DRESSINGS) IMPLANT
BNDG COHESIVE 6X5 TAN STRL LF (GAUZE/BANDAGES/DRESSINGS) ×2 IMPLANT
BNDG ELASTIC 6X10 VLCR STRL LF (GAUZE/BANDAGES/DRESSINGS) ×6 IMPLANT
BNDG ESMARK 6X9 LF (GAUZE/BANDAGES/DRESSINGS) ×2
BOWL SMART MIX CTS (DISPOSABLE) ×2 IMPLANT
CEMENT BONE SIMPLEX SPEEDSET (Cement) ×4 IMPLANT
CLOTH BEACON ORANGE TIMEOUT ST (SAFETY) ×2 IMPLANT
COVER SURGICAL LIGHT HANDLE (MISCELLANEOUS) ×2 IMPLANT
CUFF TOURNIQUET SINGLE 34IN LL (TOURNIQUET CUFF) IMPLANT
CUFF TOURNIQUET SINGLE 44IN (TOURNIQUET CUFF) ×2 IMPLANT
DRAPE C-ARM 42X72 X-RAY (DRAPES) IMPLANT
DRAPE EXTREMITY T 121X128X90 (DRAPE) IMPLANT
DRAPE INCISE IOBAN 66X45 STRL (DRAPES) ×2 IMPLANT
DRAPE ORTHO SPLIT 77X108 STRL (DRAPES) ×6
DRAPE PROXIMA HALF (DRAPES) ×6 IMPLANT
DRAPE SURG ORHT 6 SPLT 77X108 (DRAPES) ×3 IMPLANT
DRAPE U-SHAPE 47X51 STRL (DRAPES) ×2 IMPLANT
DRAPE X-RAY CASS 24X20 (DRAPES) IMPLANT
DRSG EMULSION OIL 3X3 NADH (GAUZE/BANDAGES/DRESSINGS) IMPLANT
DRSG PAD ABDOMINAL 8X10 ST (GAUZE/BANDAGES/DRESSINGS) ×2 IMPLANT
DURAPREP 26ML APPLICATOR (WOUND CARE) ×4 IMPLANT
ELECT REM PT RETURN 9FT ADLT (ELECTROSURGICAL) ×2
ELECTRODE REM PT RTRN 9FT ADLT (ELECTROSURGICAL) ×1 IMPLANT
EVACUATOR 1/8 PVC DRAIN (DRAIN) IMPLANT
FACESHIELD LNG OPTICON STERILE (SAFETY) ×2 IMPLANT
GAUZE XEROFORM 1X8 LF (GAUZE/BANDAGES/DRESSINGS) IMPLANT
GAUZE XEROFORM 5X9 LF (GAUZE/BANDAGES/DRESSINGS) ×2 IMPLANT
GLOVE BIO SURGEON STRL SZ7 (GLOVE) ×2 IMPLANT
GLOVE BIOGEL PI IND STRL 7.5 (GLOVE) ×1 IMPLANT
GLOVE BIOGEL PI IND STRL 8 (GLOVE) ×1 IMPLANT
GLOVE BIOGEL PI INDICATOR 7.5 (GLOVE) ×1
GLOVE BIOGEL PI INDICATOR 8 (GLOVE) ×1
GLOVE ECLIPSE 7.0 STRL STRAW (GLOVE) ×4 IMPLANT
GLOVE SURG ORTHO 8.0 STRL STRW (GLOVE) ×4 IMPLANT
GLOVE SURG SS PI 8.0 STRL IVOR (GLOVE) ×2 IMPLANT
GOWN PREVENTION PLUS LG XLONG (DISPOSABLE) ×2 IMPLANT
GOWN PREVENTION PLUS XLARGE (GOWN DISPOSABLE) ×2 IMPLANT
GOWN STRL NON-REIN LRG LVL3 (GOWN DISPOSABLE) ×4 IMPLANT
HANDPIECE INTERPULSE COAX TIP (DISPOSABLE) ×2
HOOD PEEL AWAY FACE SHEILD DIS (HOOD) ×8 IMPLANT
IMMOBILIZER KNEE 20 (SOFTGOODS)
IMMOBILIZER KNEE 20 THIGH 36 (SOFTGOODS) IMPLANT
IMMOBILIZER KNEE 22 UNIV (SOFTGOODS) ×4 IMPLANT
IMMOBILIZER KNEE 24 THIGH 36 (MISCELLANEOUS) IMPLANT
IMMOBILIZER KNEE 24 UNIV (MISCELLANEOUS)
KIT BASIN OR (CUSTOM PROCEDURE TRAY) ×2 IMPLANT
KIT ROOM TURNOVER OR (KITS) ×2 IMPLANT
MANIFOLD NEPTUNE II (INSTRUMENTS) ×2 IMPLANT
MARKER SPHERE PSV REFLC THRD 5 (MARKER) IMPLANT
NEEDLE 18GX1X1/2 (RX/OR ONLY) (NEEDLE) ×2 IMPLANT
NEEDLE SPNL 18GX3.5 QUINCKE PK (NEEDLE) ×2 IMPLANT
NS IRRIG 1000ML POUR BTL (IV SOLUTION) ×2 IMPLANT
PACK GENERAL/GYN (CUSTOM PROCEDURE TRAY) IMPLANT
PACK TOTAL JOINT (CUSTOM PROCEDURE TRAY) ×2 IMPLANT
PAD ARMBOARD 7.5X6 YLW CONV (MISCELLANEOUS) ×4 IMPLANT
PAD CAST 4YDX4 CTTN HI CHSV (CAST SUPPLIES) ×1 IMPLANT
PADDING CAST COTTON 4X4 STRL (CAST SUPPLIES) ×2
PADDING CAST COTTON 6X4 STRL (CAST SUPPLIES) ×2 IMPLANT
PIN SCHANZ 4MM 130MM (PIN) IMPLANT
RUBBERBAND STERILE (MISCELLANEOUS) ×2 IMPLANT
SET HNDPC FAN SPRY TIP SCT (DISPOSABLE) ×1 IMPLANT
SPONGE GAUZE 4X4 12PLY (GAUZE/BANDAGES/DRESSINGS) ×2 IMPLANT
SPONGE LAP 18X18 X RAY DECT (DISPOSABLE) IMPLANT
STAPLER VISISTAT 35W (STAPLE) ×2 IMPLANT
STEM CEMENTED TRIATHLON (Stem) ×2 IMPLANT
STOCKINETTE IMPERVIOUS 9X36 MD (GAUZE/BANDAGES/DRESSINGS) IMPLANT
SUCTION FRAZIER TIP 10 FR DISP (SUCTIONS) ×2 IMPLANT
SUT ETHILON 3 0 PS 1 (SUTURE) IMPLANT
SUT ETHILON 4 0 FS 1 (SUTURE) IMPLANT
SUT VIC AB 0 CT1 27 (SUTURE)
SUT VIC AB 0 CT1 27XBRD ANBCTR (SUTURE) IMPLANT
SUT VIC AB 0 CTB1 27 (SUTURE) ×6 IMPLANT
SUT VIC AB 1 CT1 27 (SUTURE) ×10
SUT VIC AB 1 CT1 27XBRD ANBCTR (SUTURE) ×5 IMPLANT
SUT VIC AB 2-0 CT1 27 (SUTURE) ×4
SUT VIC AB 2-0 CT1 TAPERPNT 27 (SUTURE) ×2 IMPLANT
SYR 30ML SLIP (SYRINGE) ×2 IMPLANT
SYR TB 1ML LUER SLIP (SYRINGE) ×2 IMPLANT
TOWEL OR 17X24 6PK STRL BLUE (TOWEL DISPOSABLE) ×2 IMPLANT
TOWEL OR 17X26 10 PK STRL BLUE (TOWEL DISPOSABLE) ×4 IMPLANT
TRAY FOLEY CATH 14FR (SET/KITS/TRAYS/PACK) ×2 IMPLANT
WATER STERILE IRR 1000ML POUR (IV SOLUTION) ×4 IMPLANT

## 2013-02-26 NOTE — Anesthesia Preprocedure Evaluation (Signed)
Anesthesia Evaluation  Patient identified by MRN, date of birth, ID band Patient awake    Reviewed: Allergy & Precautions, H&P , NPO status , Patient's Chart, lab work & pertinent test results  Airway Mallampati: I TM Distance: >3 FB Neck ROM: full    Dental   Pulmonary former smoker,          Cardiovascular Rhythm:regular Rate:Normal     Neuro/Psych    GI/Hepatic GERD-  ,  Endo/Other    Renal/GU      Musculoskeletal  (+) Arthritis -, Osteoarthritis,    Abdominal   Peds  Hematology   Anesthesia Other Findings   Reproductive/Obstetrics                           Anesthesia Physical Anesthesia Plan  ASA: I  Anesthesia Plan: General   Post-op Pain Management:    Induction: Intravenous  Airway Management Planned: Oral ETT  Additional Equipment:   Intra-op Plan:   Post-operative Plan: Extubation in OR  Informed Consent: I have reviewed the patients History and Physical, chart, labs and discussed the procedure including the risks, benefits and alternatives for the proposed anesthesia with the patient or authorized representative who has indicated his/her understanding and acceptance.     Plan Discussed with: CRNA, Anesthesiologist and Surgeon  Anesthesia Plan Comments:         Anesthesia Quick Evaluation

## 2013-02-26 NOTE — Brief Op Note (Cosign Needed)
02/26/2013  2:20 PM  PATIENT:  Maria Hancock  47 y.o. female  PRE-OPERATIVE DIAGNOSIS:  Left Knee Osteoarthritis, Retained Hardware  POST-OPERATIVE DIAGNOSIS:  Left Knee Osteoarthritis, Retained Hardware  PROCEDURE:  Procedure(s) with comments: TOTAL KNEE ARTHROPLASTY (Left) - Left Total Knee Arthroplasty  HARDWARE REMOVAL (Left) - Left Knee Removal of Hardware  SURGEON:  Surgeon(s) and Role:    * Cammy Copa, MD - Primary  PHYSICIAN ASSISTANT: Maud Deed [facility]  ASSISTANTS: none   ANESTHESIA:   general  EBL:  Total I/O In: 2000 [I.V.:2000] Out: 300 [Urine:100; Blood:200]  BLOOD ADMINISTERED:none  DRAINS: none   LOCAL MEDICATIONS USED:  MARCAINE    and  clonadine and morphine  SPECIMEN:  No Specimen  DISPOSITION OF SPECIMEN:  N/A  COUNTS:  YES  TOURNIQUET:   Total Tourniquet Time Documented: Thigh (Left) - 123 minutes Total: Thigh (Left) - 123 minutes   DICTATION: .Note written in EPIC  PLAN OF CARE: Admit to inpatient   PATIENT DISPOSITION:  PACU - hemodynamically stable.   Delay start of Pharmacological VTE agent (>24hrs) due to surgical blood loss or risk of bleeding: no

## 2013-02-26 NOTE — Interval H&P Note (Signed)
History and Physical Interval Note:  02/26/2013 10:50 AM  Maria Hancock  has presented today for surgery, with the diagnosis of Left Knee Osteoarthritis, Retained Hardware  The various methods of treatment have been discussed with the patient and family. After consideration of risks, benefits and other options for treatment, the patient has consented to  Procedure(s) with comments: TOTAL KNEE ARTHROPLASTY (Left) - Left Total Knee Arthroplasty  HARDWARE REMOVAL (Left) - Left Knee Removal of Hardware as a surgical intervention .  The patient's history has been reviewed, patient examined, no change in status, stable for surgery.  I have reviewed the patient's chart and labs.  Questions were answered to the patient's satisfaction.     [name]

## 2013-02-26 NOTE — Progress Notes (Signed)
ANTICOAGULATION CONSULT NOTE - Initial Consult  Pharmacy Consult for coumadin Indication: VTE prophylaxis  No Known Allergies  Patient Measurements:   Heparin Dosing Weight:   Vital Signs: Temp: 97.7 F (36.5 C) (05/27 1611) BP: 115/65 mmHg (05/27 1611) Pulse Rate: 73 (05/27 1611)  Labs: No results found for this basename: HGB, HCT, PLT, APTT, LABPROT, INR, HEPARINUNFRC, CREATININE, CKTOTAL, CKMB, TROPONINI,  in the last 72 hours  CrCl is unknown because there is no height on file for the current visit.   Medical History: Past Medical History  Diagnosis Date  . Anxiety   . IBS (irritable bowel syndrome)   . Menorrhagia   . GERD (gastroesophageal reflux disease)     with Mobic  . Arthritis     "knees, back, hands" (02/26/2013)    Medications:  Scheduled:  .  ceFAZolin (ANCEF) IV  2 g Intravenous Q6H  . docusate sodium  100 mg Oral BID  . HYDROmorphone      . methocarbamol      . morphine   Intravenous Q4H  . sertraline  100 mg Oral Daily  . warfarin   Does not apply Once  . [START ON 02/27/2013] Warfarin - Pharmacist Dosing Inpatient   Does not apply q1800    Assessment: 47 yr old female presented for left total knee arthroplasty.  Goal of Therapy:  INR 2-3    Plan:  1) Coumadin 7.5 mg  2) Daily INR  3) Ordered coumadin booklet and video for pt.  Eugene Garnet 02/26/2013,10:26 PM

## 2013-02-26 NOTE — Progress Notes (Signed)
Full dose morphine PCA verified by Haze Boyden RN

## 2013-02-26 NOTE — Transfer of Care (Signed)
Immediate Anesthesia Transfer of Care Note  Patient: Maria Hancock  Procedure(s) Performed: Procedure(s) with comments: TOTAL KNEE ARTHROPLASTY (Left) - Left Total Knee Arthroplasty  HARDWARE REMOVAL (Left) - Left Knee Removal of Hardware  Patient Location: PACU  Anesthesia Type:General  Level of Consciousness: awake, alert , oriented and sedated  Airway & Oxygen Therapy: Patient Spontanous Breathing and Patient connected to face mask oxygen  Post-op Assessment: Report given to PACU RN, Post -op Vital signs reviewed and stable and Patient moving all extremities  Post vital signs: Reviewed and stable  Complications: No apparent anesthesia complications

## 2013-02-26 NOTE — Progress Notes (Signed)
Orthopedic Tech Progress Note Patient Details:  Maria Hancock [birth year] [id number]  Ortho Devices Ortho Device/Splint Location: footsie roll Ortho Device/Splint Interventions: Casandra Doffing 02/26/2013, 3:44 PM

## 2013-02-26 NOTE — Anesthesia Postprocedure Evaluation (Signed)
  Anesthesia Post-op Note  Patient: Maria Hancock  Procedure(s) Performed: Procedure(s) with comments: TOTAL KNEE ARTHROPLASTY (Left) - Left Total Knee Arthroplasty  HARDWARE REMOVAL (Left) - Left Knee Removal of Hardware  Patient Location: PACU  Anesthesia Type:General  Level of Consciousness: awake, alert , oriented and patient cooperative  Airway and Oxygen Therapy: Patient Spontanous Breathing  Post-op Pain: mild  Post-op Assessment: Post-op Vital signs reviewed, Patient's Cardiovascular Status Stable, Respiratory Function Stable, Patent Airway, No signs of Nausea or vomiting and Pain level controlled  Post-op Vital Signs: stable  Complications: No apparent anesthesia complications

## 2013-02-26 NOTE — Brief Op Note (Signed)
02/26/2013  2:26 PM  PATIENT:  Marena Witts Perillo  47 y.o. female  PRE-OPERATIVE DIAGNOSIS:  Left Knee Osteoarthritis, Retained Hardware  POST-OPERATIVE DIAGNOSIS:  Left Knee Osteoarthritis, Retained Hardware  PROCEDURE:  Procedure(s): TOTAL KNEE ARTHROPLASTY HARDWARE REMOVAL  SURGEON:  Surgeon(s): Cammy Copa, MD  ASSISTANT: S vernon pa  ANESTHESIA:   general  EBL: 100 ml    Total I/O In: 2000 [I.V.:2000] Out: 300 [Urine:100; Blood:200]  BLOOD ADMINISTERED: none  DRAINS: none   LOCAL MEDICATIONS USED:  none  SPECIMEN:  No Specimen  COUNTS:  YES  TOURNIQUET:   Total Tourniquet Time Documented: Thigh (Left) - 123 minutes Total: Thigh (Left) - 123 minutes   DICTATION: .Other Dictation: Dictation Number [id number]  PLAN OF CARE: Admit to inpatient   PATIENT DISPOSITION:  PACU - hemodynamically stable

## 2013-02-26 NOTE — Preoperative (Signed)
Beta Blockers   Reason not to administer Beta Blockers:Not Applicable 

## 2013-02-26 NOTE — Progress Notes (Signed)
Orthopedic Tech Progress Note Patient Details:  Maria Hancock Feb 15, 1966 [id number]  CPM Left Knee CPM Left Knee: On Left Knee Flexion (Degrees): 40 Left Knee Extension (Degrees): 0   Maria Hancock 02/26/2013, 3:43 PM

## 2013-02-27 ENCOUNTER — Encounter (HOSPITAL_COMMUNITY): Payer: Self-pay | Admitting: Orthopedic Surgery

## 2013-02-27 LAB — CBC
MCH: 31.6 pg (ref 26.0–34.0)
MCV: 91.8 fL (ref 78.0–100.0)
Platelets: 248 10*3/uL (ref 150–400)
RBC: 3.8 MIL/uL — ABNORMAL LOW (ref 3.87–5.11)
RDW: 13.6 % (ref 11.5–15.5)

## 2013-02-27 LAB — BASIC METABOLIC PANEL
Calcium: 8.7 mg/dL (ref 8.4–10.5)
Creatinine, Ser: 0.67 mg/dL (ref 0.50–1.10)
GFR calc Af Amer: >90 mL/min (ref >90)
GFR calc non Af Amer: >90 mL/min (ref >90)
Sodium: 134 mEq/L — ABNORMAL LOW (ref 135–145)

## 2013-02-27 LAB — PROTIME-INR: Prothrombin Time: 13.7 seconds (ref 11.6–15.2)

## 2013-02-27 MED ORDER — WARFARIN SODIUM 7.5 MG PO TABS
7.5000 mg | ORAL_TABLET | Freq: Once | ORAL | Status: AC
  Administered 2013-02-27: 7.5 mg via ORAL
  Filled 2013-02-27: qty 1

## 2013-02-27 MED ORDER — OXYCODONE HCL ER 10 MG PO T12A
10.0000 mg | EXTENDED_RELEASE_TABLET | Freq: Two times a day (BID) | ORAL | Status: DC
  Administered 2013-02-27 – 2013-03-02 (×7): 10 mg via ORAL
  Filled 2013-02-27 (×7): qty 1

## 2013-02-27 NOTE — Care Management Note (Signed)
CARE MANAGEMENT NOTE 02/27/2013  Patient:  Maria Hancock, Maria Hancock   Account Number:  [id number]  Date Initiated:  02/27/2013  Documentation initiated by:  Vance Peper  Subjective/Objective Assessment:   47 yr old female s/p left total knee arthroplasty.     Action/Plan:   Patient preoperatively setup with [facility]. No changes. Patient has roling walker, 3in1. CPM to be delivered.Has family support at discharge.   Anticipated DC Date:  02/28/2013   Anticipated DC Plan:  HOME W HOME HEALTH SERVICES      DC Planning Services  CM consult      PAC Choice  DURABLE MEDICAL EQUIPMENT  HOME HEALTH   Choice offered to / List presented to:  C-1 Patient   DME arranged  CPM      DME agency  [employer]     HH arranged  HH-1 RN  HH-2 PT      HH agency  [facility]   Status of service:  Completed, signed off Medicare Important Message given?   (If response is "NO", the following Medicare IM given date fields will be blank) Date Medicare IM given:   Date Additional Medicare IM given:    Discharge Disposition:  HOME W HOME HEALTH SERVICES  Per UR Regulation:    If discussed at Long Length of Stay Meetings, dates discussed:    Comments:

## 2013-02-27 NOTE — Evaluation (Signed)
Physical Therapy Evaluation Patient Details Name: Maria Hancock MRN: [id number] DOB: 01-22-1966 Today's Date: 02/27/2013 Time: 4098-1191 PT Time Calculation (min): 31 min  PT Assessment / Plan / Recommendation Clinical Impression  Pt is a 47 y.o. female s/p L TKA POD#1. Pt presents with decreased mobility, decreased indepdence with gt/transfers, decreased strength/ROM and is limited by pain. Will benefit from skilled PT to maximize functional mobility and ensure safe transition home with family and HHPT.    PT Assessment  Patient needs continued PT services    Follow Up Recommendations  Home health PT;Supervision/Assistance - 24 hour    Does the patient have the potential to tolerate intense rehabilitation      Barriers to Discharge None      Equipment Recommendations  None recommended by PT    Recommendations for Other Services     Frequency 7X/week    Precautions / Restrictions Precautions Precautions: Knee;Fall Precaution Booklet Issued: Yes (comment) Required Braces or Orthoses: Knee Immobilizer - Left Knee Immobilizer - Left: On except when in CPM;On when out of bed or walking Restrictions Weight Bearing Restrictions: Yes LLE Weight Bearing: Weight bearing as tolerated   Pertinent Vitals/Pain 10/10 pt called RN for pain meds; repositioned in chair in footsie roll       Mobility  Bed Mobility Bed Mobility: Supine to Sit;Sitting - Scoot to Edge of Bed Supine to Sit: 4: Min assist;With rails;HOB elevated Sitting - Scoot to [place] of Bed: 4: Min assist Details for Bed Mobility Assistance: (A) to advance L LE to EOB and to lower to surface with back of knee supported; requires vc's for hand placment and increased time due to pain  Transfers Transfers: Sit to Stand;Stand to Sit Stand to Sit: 4: Min assist Details for Transfer Assistance: verbal cues for hand placement and safety; requires (A) to control descent to shift weight forward to stand; (A) to steady due to  pain and inability to WB through LLE due to pain  Ambulation/Gait Ambulation/Gait Assistance: 4: Min guard Ambulation Distance (Feet): 12 Feet Assistive device: Rolling walker Ambulation/Gait Assistance Details: verbal cues for gt sequencing and RW safety; pt attempts to hop to for gt vs. WB on L LE due to pain; demo decreased ability to WB through L LE Gait Pattern: Step-to pattern;Decreased stance time - left;Decreased step length - right Gait velocity: decreased due to pain  Stairs: No Wheelchair Mobility Wheelchair Mobility: No    Exercises Total Joint Exercises Ankle Circles/Pumps: AROM;Both;10 reps;Supine Quad Sets: AROM;Left;10 reps;Seated Hip ABduction/ADduction: AAROM;Left;10 reps;Seated Long Arc Quad: AAROM;Left;10 reps;Seated Knee Flexion: AAROM;Left;10 reps;Seated Goniometric ROM: 4 to 45 degrees in sitting AROM limited by pain    PT Diagnosis: Difficulty walking;Acute pain  PT Problem List: Decreased strength;Decreased range of motion;Decreased activity tolerance;Decreased balance;Decreased mobility;Decreased knowledge of use of DME;Decreased safety awareness;Pain PT Treatment Interventions: DME instruction;Gait training;Functional mobility training;Stair training;Therapeutic activities;Therapeutic exercise;Balance training;Neuromuscular re-education;Patient/family education   PT Goals Acute Rehab PT Goals PT Goal Formulation: With patient Time For Goal Achievement: 03/03/13 Potential to Achieve Goals: Good Pt will go Supine/Side to Sit: with modified independence PT Goal: Supine/Side to Sit - Progress: Goal set today Pt will go Sit to Supine/Side: with modified independence PT Goal: Sit to Supine/Side - Progress: Goal set today Pt will go Sit to Stand: with modified independence PT Goal: Sit to Stand - Progress: Goal set today Pt will go Stand to Sit: with modified independence PT Goal: Stand to Sit - Progress: Goal set today Pt will Ambulate: >150  feet;with  modified independence;with rolling walker PT Goal: Ambulate - Progress: Goal set today Pt will Go Up / Down Stairs: 3-5 stairs;with rail(s);with rolling walker PT Goal: Up/Down Stairs - Progress: Goal set today Pt will Perform Home Exercise Program: with supervision, verbal cues required/provided PT Goal: Perform Home Exercise Program - Progress: Goal set today  Visit Information  Last PT Received On: 02/27/13 Assistance Needed: +1 PT/OT Co-Evaluation/Treatment: Yes    Subjective Data  Subjective: Pt lying supine; agreeable to therapy Patient Stated Goal: home with husband and son   Prior Functioning  Home Living Lives With: Significant other;Son Available Help at Discharge: Family;Available 24 hours/day Type of Home: House Home Access: Stairs to enter [employer] of Steps: 2 Entrance Stairs-Rails: Can reach both Home Layout: Two level;Able to live on main level with bedroom/bathroom;Other (Comment) (doesnt go to 2nd level) Bathroom Shower/Tub: Engineer, manufacturing systems: Standard Bathroom Accessibility: Yes How Accessible: Accessible via walker Home Adaptive Equipment: Walker - rolling;Straight cane;Bedside commode/3-in-1 Prior Function Level of Independence: Independent Able to Take Stairs?: Yes Driving: Yes Vocation: Full time employment Communication Communication: No difficulties Dominant Hand: Right    Cognition  Cognition Arousal/Alertness: Awake/alert Behavior During Therapy: WFL for tasks assessed/performed Overall Cognitive Status: Within Functional Limits for tasks assessed    Extremity/Trunk Assessment Right Lower Extremity Assessment RLE ROM/Strength/Tone: WFL for tasks assessed RLE Sensation: WFL - Light Touch Left Lower Extremity Assessment LLE ROM/Strength/Tone: Deficits;Unable to fully assess LLE ROM/Strength/Tone Deficits: decreased due to pain and surgery; unable to perform SLR or LAQ; DF/PF WFL  LLE Sensation: Deficits LLE  Sensation Deficits: diminshed to light touch on foot vs. R LE  Trunk Assessment Trunk Assessment: Kyphotic   Balance Balance Balance Assessed: No  End of Session PT - End of Session Equipment Utilized During Treatment: Gait belt;Left knee immobilizer Activity Tolerance: Patient limited by pain Patient left: in chair;with call bell/phone within reach;with family/visitor present Nurse Communication: Mobility status CPM Left Knee CPM Left Knee: [address]  GP     Donnamarie Poag Solomon, [place] [phone] 02/27/2013, 11:05 AM

## 2013-02-27 NOTE — Progress Notes (Signed)
UR COMPLETED  

## 2013-02-27 NOTE — Op Note (Signed)
NAMEFREYA, Hancock            ACCOUNT NO.:  [id number]  MEDICAL RECORD NO.:  [id number]  LOCATION:  5N12C                        FACILITY:  [facility]  PHYSICIAN:  Burnard Bunting, M.D.    DATE OF BIRTH:  Mar 10, 1966  DATE OF PROCEDURE:  02/26/2013 DATE OF DISCHARGE:                              OPERATIVE REPORT   PREOPERATIVE DIAGNOSIS:  Left knee arthritis.  POSTOPERATIVE DIAGNOSIS:  Left knee arthritis.  PROCEDURE:  Left total knee replacement with hardware removal utilizing Smith [employer]hew components 4 femur, 5 tibia, 9 mm polyethylene insert, posterior cruciate sacrificing, 32 mm 3 PEG patella.  SURGEON:  Burnard Bunting, M.D.  ASSISTANT:  Maria Hancock, P.A.  ANESTHESIA:  General endotracheal.  ESTIMATED BLOOD LOSS:  100 mL.  DRAINS:  None.  TOURNIQUET TIME:  120 minutes at 325 mmHg.  INDICATION:  Maria Hancock is a patient with left knee arthritis end- stage refractory to nonoperative management.  He presents now for operative management after explanation of risks and benefits.  PROCEDURE IN DETAIL:  The patient was brought to the operating room where general endotracheal anesthesia was induced.  Preop antibiotics administered.  Time-out was called.  Left leg was prescrubbed with alcohol and Betadine, allowed to air dry, prepped with DuraPrep solution and draped in a sterile manner.  Maria Hancock was used to cover the operative field.  Time-out was called.  Left leg was then elevated and exsanguinated with Esmarch wrap.  Tourniquet was inflated.  Anterior approach to the knee was made.  Skin and subcutaneous tissues were sharply divided.  Median parapatellar approach was made to the knee. Precise location marked with #1 Vicryl suture, hardware removed distal medial tibia because of the anticipated use of a stem in this patient with increased body mass index.  Lateral patellofemoral ligament released, soft tissue removed anterior distal femur.  Minimal soft tissue  dissection performed on the medial side because of the preop varus alignment.  Intramedullary alignment used to cut the tibia perpendicular mechanical axis.  This was 4 mm off the most affected lateral side, 12 mm cut was then made after sequential measurements of extension gap off the distal femur.  At this time, box cut and chamfer cuts were made.  Tibia was keel punched to accept a 50 mm stem.  Patella was then prepared by cutting from 26-15 and applying a 3 PEG patella. With trial components in position, the patient achieved about 3 degrees of hyperextension, full flexion, excellent stability to varus-valgus stress at 0, 30, and 90 degrees with excellent patellar tracking.  I was going to try an 11 mm spacer, however, it could not even be seated and thus the 9 mm spacer was chosen.  At this time, trial components were removed.  Thorough irrigation with 3 L of pulsatile solution performed. True components cemented into position with same stability parameters maintained.  At this time, tourniquet was released.  Bleeding points were encountered and controlled with electrocautery.  Excess cement removed.  Incision was then closed over bolster using interrupted inverted #1 Vicryl suture, 0-Vicryl suture, 2-0 Vicryl suture, and skin staples.  Solution of Marcaine, morphine, clonidine injected to the knee.  Bulky dressing and knee immobilizer  were applied.  Maria Hancock's assistance was required at all times during the case for opening, closing, retraction, limb positioning.  Her assistance was a medical necessity.     Burnard Bunting, M.D.     GSD/MEDQ  D:  02/26/2013  T:  02/27/2013  Job:  [id number]

## 2013-02-27 NOTE — Progress Notes (Signed)
Subjective: Pt stable - pain controlled   Objective: Vital signs in last 24 hours: Temp:  [97 F (36.1 C)-99.1 F (37.3 C)] 98.6 F (37 C) (05/27 2235) Pulse Rate:  [73-109] 104 (05/27 2235) Resp:  [12-22] 15 (05/28 0524) BP: (92-130)/(49-79) 120/75 mmHg (05/27 2235) SpO2:  [92 %-100 %] 98 % (05/28 0524)  Intake/Output from previous day: 05/27 0701 - 05/28 0700 In: 3340 [P.O.:290; I.V.:3000; IV Piggyback:50] Out: 500 [Urine:200; Blood:300] Intake/Output this shift: Total I/O In: 50 [IV Piggyback:50] Out: -   Exam:  Intact pulses distally Dorsiflexion/Plantar flexion intact Compartment soft  Labs:  Recent Labs  02/27/13 0510  HGB 12.0    Recent Labs  02/27/13 0510  WBC 9.4  RBC 3.80*  HCT 34.9*  PLT 248   No results found for this basename: NA, K, CL, CO2, BUN, CREATININE, GLUCOSE, CALCIUM,  in the last 72 hours  Recent Labs  02/27/13 0510  INR 1.06    Assessment/Plan: Plan PT and CPM today - oral pain meds - oob to chair   Marquett Bertoli SCOTT 02/27/2013, 6:25 AM

## 2013-02-27 NOTE — Progress Notes (Signed)
ANTICOAGULATION CONSULT NOTE - Follow Up Consult  Pharmacy Consult for Warfarin Indication: VTE prophylaxis s/p L-TKR on 5/27  No Known Allergies  Patient Measurements: Height: 5' 4.96" (165 cm) Weight: 276 lb 3.8 oz (125.3 kg) IBW/kg (Calculated) : 56.91  Vital Signs: Temp: 99 F (37.2 C) (05/28 0626) BP: 130/74 mmHg (05/28 0626) Pulse Rate: 84 (05/28 0626)  Labs:  Recent Labs  02/27/13 0510  HGB 12.0  HCT 34.9*  PLT 248  LABPROT 13.7  INR 1.06  CREATININE 0.67    Estimated Creatinine Clearance: 119.4 ml/min (by C-G formula based on Cr of 0.67).   Assessment: 47 y.o. F who continues on warfarin + SCDs for VTE prophylaxis s/p L-TKR on 5/27. INR this morning is SUBtherapeutic (INR 1.06, goal of 2-3). Hgb/Hct/Plt slight drop from baseline labs -- no overt s/sx of bleeding noted. The patient was educated on warfarin today.   Goal of Therapy:  INR 2-3   Plan:  1. Warfarin 7.5 mg x 1 dose at 1800 today] 2. Will continue to monitor for any signs/symptoms of bleeding and will follow up with PT/INR in the a.m.   Georgina Pillion, PharmD, BCPS Clinical Pharmacist Pager: [phone] 02/27/2013 1:58 PM

## 2013-02-27 NOTE — Progress Notes (Signed)
Physical Therapy Treatment Patient Details Name: Maria Hancock MRN: [id number] DOB: [birth year] Today's Date: 02/27/2013 Time: 4098-1191 PT Time Calculation (min): 28 min  PT Assessment / Plan / Recommendation Comments on Treatment Session  pt progressing slowly. limited greatly by pain. will cont to f/u with pt to maximize functional mobility and ensure safe transition home with family and HHPT     Follow Up Recommendations  Home health PT;Supervision/Assistance - 24 hour     Does the patient have the potential to tolerate intense rehabilitation     Barriers to Discharge        Equipment Recommendations  None recommended by PT    Recommendations for Other Services    Frequency 7X/week   Plan Discharge plan remains appropriate;Frequency remains appropriate    Precautions / Restrictions Precautions Precautions: Knee;Fall Precaution Booklet Issued: Yes (comment) Required Braces or Orthoses: Knee Immobilizer - Left Knee Immobilizer - Left: On except when in CPM;On when out of bed or walking Restrictions Weight Bearing Restrictions: Yes LLE Weight Bearing: Weight bearing as tolerated   Pertinent Vitals/Pain 8/10; pt in CPM set to 50degrees flex; tolerable.     Mobility  Bed Mobility Bed Mobility: Sit to Supine Details for Bed Mobility Assistance: (A) to advance L LE into bed  Transfers Transfers: Sit to Stand;Stand to Sit Sit to Stand: From chair/3-in-1;4: Min assist;With armrests Stand to Sit: 4: Min guard;To elevated surface;To bed Details for Transfer Assistance: verbal cues for hand placement and safety; requires (A) to steady; pt unable to fully WB through L LE, contributing to balance deficits Ambulation/Gait Ambulation/Gait Assistance: 4: Min guard Ambulation Distance (Feet): 20 Feet Assistive device: Rolling walker Ambulation/Gait Assistance Details: verbal cues for gt sequencing and to increase stride length on R side; pt unable to equalize strides due to  decreased ability to WB on L LE; requires multiple standing rest breaks due to fatigue in UEs and pain in L LE Gait Pattern: Step-to pattern;Decreased stance time - left;Decreased step length - right Gait velocity: decreased due to pain  Stairs: No Wheelchair Mobility Wheelchair Mobility: No    Exercises Total Joint Exercises Ankle Circles/Pumps: AROM;Both;10 reps;Seated Quad Sets: AROM;Left;10 reps;Supine Heel Slides: AAROM;Left;10 reps;Supine (limited greatly by pain )   PT Diagnosis:    PT Problem List:   PT Treatment Interventions:     PT Goals Acute Rehab PT Goals PT Goal Formulation: With patient Time For Goal Achievement: 03/03/13 Potential to Achieve Goals: Good PT Goal: Sit to Supine/Side - Progress: Progressing toward goal PT Goal: Sit to Stand - Progress: Progressing toward goal PT Goal: Stand to Sit - Progress: Progressing toward goal PT Goal: Ambulate - Progress: Progressing toward goal PT Goal: Perform Home Exercise Program - Progress: Progressing toward goal  Visit Information  Last PT Received On: 02/27/13 Assistance Needed: +1    Subjective Data  Subjective: Pt sitting in chair; eager to get back in bed Patient Stated Goal: home with husband and son   Cognition  Cognition Arousal/Alertness: Awake/alert Behavior During Therapy: WFL for tasks assessed/performed Overall Cognitive Status: Within Functional Limits for tasks assessed    Balance  Balance Balance Assessed: No  End of Session PT - End of Session Equipment Utilized During Treatment: Gait belt;Left knee immobilizer Activity Tolerance: Patient limited by pain Patient left: with call bell/phone within reach;with family/visitor present;in bed;in CPM Nurse Communication: Mobility status CPM Left Knee CPM Left Knee: On Left Knee Flexion (Degrees): 40 Left Knee Extension (Degrees): 0   GP  Donnamarie Poag [place], [place] [phone] 02/27/2013, 4:05 PM

## 2013-02-27 NOTE — Progress Notes (Signed)
Chaplain Note: Chaplain visited with pt an pt's family.  Pt was resting in a recliner next to bed.  Pt's family was seated nearby in support of pt.  Chaplain provided spiritual comfort, support, and prayer for pt and pt's family.  Pt and family expressed appreciation for chaplain support.  Chaplain will follow up as needed.  02/27/13 1300  Clinical Encounter Type  Visited With Patient and family together  Visit Type Follow-up;Spiritual support  Referral From Nurse  Spiritual Encounters  Spiritual Needs Prayer;Emotional  Stress Factors  Patient Stress Factors Health changes;Major life changes  Family Stress Factors Major life changes  Verdie Shire, [place] [phone]

## 2013-02-27 NOTE — Evaluation (Signed)
Occupational Therapy Evaluation Patient Details Name: Maria Hancock MRN: [id number] DOB: 12-13-1965 Today's Date: 02/27/2013 Time: 1478-2956 OT Time Calculation (min): 21 min  OT Assessment / Plan / Recommendation Clinical Impression    Pt is a 47 y.o. female s/p L TKA POD#1. Pt presents with below problem list and is limited by pain. Will benefit from skilled OT to maximize independence and ensure safe transition home with family. Recommend HHOT upon d/c.      OT Assessment  Patient needs continued OT Services    Follow Up Recommendations  Home health OT;Supervision/Assistance - 24 hour    Barriers to Discharge      Equipment Recommendations  None recommended by OT    Recommendations for Other Services    Frequency  Min 2X/week    Precautions / Restrictions Precautions Precautions: Knee;Fall Precaution Booklet Issued: Yes (comment) Required Braces or Orthoses: Knee Immobilizer - Left Knee Immobilizer - Left: On except when in CPM;On when out of bed or walking Restrictions Weight Bearing Restrictions: Yes LLE Weight Bearing: Weight bearing as tolerated   Pertinent Vitals/Pain Pain 8/10. Repositioned.    ADL  Eating/Feeding: Independent Where Assessed - Eating/Feeding: Chair Grooming: Set up Where Assessed - Grooming: Unsupported sitting Upper Body Bathing: Set up Where Assessed - Upper Body Bathing: Unsupported sitting Lower Body Bathing: Moderate assistance Where Assessed - Lower Body Bathing: Supported sit to stand Upper Body Dressing: Set up Where Assessed - Upper Body Dressing: Unsupported sitting Lower Body Dressing: Moderate assistance Where Assessed - Lower Body Dressing: Supported sit to Pharmacist, hospital: Mining engineer Method: Sit to Barista: Other (comment) (from recliner chair) Tub/Shower Transfer Method: Not assessed Equipment Used: Gait belt;Knee Immobilizer;Rolling  walker Transfers/Ambulation Related to ADLs: Min A for transfers and minguard for ambulation. ADL Comments: Pt required Mod A to don underwear with sit to stand transfer. Pt limited today due to pain.    OT Diagnosis: Acute pain  OT Problem List: Decreased strength;Decreased range of motion;Decreased activity tolerance;Impaired balance (sitting and/or standing);Decreased knowledge of use of DME or AE;Decreased knowledge of precautions;Pain;Decreased safety awareness OT Treatment Interventions: Self-care/ADL training;DME and/or AE instruction;Therapeutic activities;Patient/family education;Balance training   OT Goals Acute Rehab OT Goals OT Goal Formulation: With patient Time For Goal Achievement: 03/13/13 Potential to Achieve Goals: Good ADL Goals Pt Will Perform Lower Body Bathing: with modified independence;Sit to stand from chair ADL Goal: Lower Body Bathing - Progress: Goal set today Pt Will Perform Lower Body Dressing: with modified independence;Sit to stand from bed;Sit to stand from chair ADL Goal: Lower Body Dressing - Progress: Goal set today Pt Will Transfer to Toilet: with modified independence;Ambulation;with DME ADL Goal: Toilet Transfer - Progress: Goal set today Pt Will Perform Toileting - Clothing Manipulation: with modified independence;Standing ADL Goal: Toileting - Clothing Manipulation - Progress: Goal set today Pt Will Perform Toileting - Hygiene: with modified independence;Sit to stand from 3-in-1/toilet;Sitting on 3-in-1 or toilet ADL Goal: Toileting - Hygiene - Progress: Goal set today Pt Will Perform Tub/Shower Transfer: Tub transfer;with supervision;Ambulation;with DME ADL Goal: Tub/Shower Transfer - Progress: Goal set today  Visit Information  Last OT Received On: 02/27/13 Assistance Needed: +1 PT/OT Co-Evaluation/Treatment: Yes    Subjective Data      Prior Functioning     Home Living Lives With: Significant other;Son Available Help at Discharge:  Family;Available 24 hours/day Type of Home: House Home Access: Stairs to enter [employer] of Steps: 2 Entrance Stairs-Rails: Can reach both Home Layout: Two  level;Able to live on main level with bedroom/bathroom;Other (Comment) (doesn't go to 2nd level) Bathroom Shower/Tub: Engineer, manufacturing systems: Standard Bathroom Accessibility: Yes How Accessible: Accessible via walker Home Adaptive Equipment: Walker - rolling;Straight cane;Bedside commode/3-in-1 Prior Function Level of Independence: Independent Able to Take Stairs?: Yes Driving: Yes Vocation: Full time employment Communication Communication: No difficulties Dominant Hand: Right         Vision/Perception     Cognition  Cognition Arousal/Alertness: Awake/alert Behavior During Therapy: WFL for tasks assessed/performed Overall Cognitive Status: Within Functional Limits for tasks assessed    Extremity/Trunk Assessment Right Upper Extremity Assessment RUE ROM/Strength/Tone: [facility] for tasks assessed Left Upper Extremity Assessment LUE ROM/Strength/Tone: WFL for tasks assessed      Mobility Bed Mobility Bed Mobility: Supine to Sit;Sitting - Scoot to Edge of Bed Supine to Sit: 4: Min assist;With rails;HOB elevated Sitting - Scoot to [place] of Bed: 4: Min assist Details for Bed Mobility Assistance: (A) to advance L LE to EOB and to lower to surface with back of knee supported; requires vc's for hand placment and increased time due to pain  Transfers Transfers: Sit to Stand;Stand to Sit Sit to Stand: 4: Min assist;With upper extremity assist;From bed;From chair/3-in-1 Stand to Sit: 4: Min assist;To chair/3-in-1 Details for Transfer Assistance: verbal cues for hand placement and safety; requires (A) to control descent to shift weight forward to stand; (A) to steady due to pain and inability to WB through LLE due to pain         Balance   End of Session OT - End of Session Equipment Utilized During  Treatment: Gait belt;Left knee immobilizer Activity Tolerance: Patient limited by pain Patient left: Other (comment);with family/visitor present (with PT) CPM Left Knee CPM Left Knee: Off  GO     Earlie Raveling OTR/L [phone] 02/27/2013, 11:55 AM

## 2013-02-28 LAB — PROTIME-INR
INR: 1.46 (ref 0.00–1.49)
Prothrombin Time: 17.3 seconds — ABNORMAL HIGH (ref 11.6–15.2)

## 2013-02-28 LAB — CBC
Hemoglobin: 12 g/dL (ref 12.0–15.0)
MCH: 31.7 pg (ref 26.0–34.0)
MCHC: 35 g/dL (ref 30.0–36.0)
Platelets: 232 10*3/uL (ref 150–400)
RDW: 13.4 % (ref 11.5–15.5)

## 2013-02-28 MED ORDER — WARFARIN SODIUM 7.5 MG PO TABS
7.5000 mg | ORAL_TABLET | Freq: Once | ORAL | Status: AC
  Administered 2013-02-28: 7.5 mg via ORAL
  Filled 2013-02-28: qty 1

## 2013-02-28 NOTE — Progress Notes (Signed)
Occupational Therapy Treatment Patient Details Name: Maria Hancock MRN: [id number] DOB: 06-03-1966 Today's Date: 02/28/2013 Time: 4098-1191 OT Time Calculation (min): 23 min  OT Assessment / Plan / Recommendation Comments on Treatment Session  Pt making slow, but steady progress with increasing independence with BADLs.  Pt continues to be limited by pain 8/10 lt. knee    Follow Up Recommendations  Home health OT;Supervision/Assistance - 24 hour    Barriers to Discharge       Equipment Recommendations  None recommended by OT    Recommendations for Other Services    Frequency Min 2X/week   Plan Discharge plan remains appropriate    Precautions / Restrictions Precautions Precautions: Knee;Fall Precaution Booklet Issued: Yes (comment) Required Braces or Orthoses: Knee Immobilizer - Left Knee Immobilizer - Left: On except when in CPM;On when out of bed or walking Restrictions Weight Bearing Restrictions: Yes LLE Weight Bearing: Weight bearing as tolerated   Pertinent Vitals/Pain     ADL  Grooming: Min guard Where Assessed - Grooming: Supported standing Lower Body Dressing: Minimal assistance Where Assessed - Lower Body Dressing: Supported sit to Pharmacist, hospital: Hydrographic surveyor Method: Sit to stand;Stand Wellsite geologist: Raised toilet seat with arms (or 3-in-1 over toilet) Toileting - Clothing Manipulation and Hygiene: Min guard Where Assessed - Glass blower/designer Manipulation and Hygiene: Standing Equipment Used: Knee Immobilizer;Rolling walker Transfers/Ambulation Related to ADLs: min guard assist ADL Comments: Requires min A to don sock and thread Lt. pantleg over lt. foot    OT Diagnosis:    OT Problem List:   OT Treatment Interventions:     OT Goals Acute Rehab OT Goals Time For Goal Achievement: 03/13/13 ADL Goals Pt Will Perform Lower Body Bathing: with modified independence;Sit to stand from chair Pt Will Perform Lower  Body Dressing: with modified independence;Sit to stand from bed;Sit to stand from chair ADL Goal: Lower Body Dressing - Progress: Progressing toward goals Pt Will Transfer to Toilet: with modified independence;Ambulation;with DME ADL Goal: Toilet Transfer - Progress: Progressing toward goals Pt Will Perform Toileting - Clothing Manipulation: with modified independence;Standing ADL Goal: Toileting - Clothing Manipulation - Progress: Progressing toward goals Pt Will Perform Toileting - Hygiene: with modified independence;Sit to stand from 3-in-1/toilet;Sitting on 3-in-1 or toilet ADL Goal: Toileting - Hygiene - Progress: Progressing toward goals  Visit Information  Last OT Received On: 02/28/13 Assistance Needed: +1    Subjective Data      Prior Functioning       Cognition  Cognition Arousal/Alertness: Awake/alert Behavior During Therapy: WFL for tasks assessed/performed Overall Cognitive Status: Within Functional Limits for tasks assessed    Mobility  Bed Mobility Bed Mobility: Supine to Sit;Sitting - Scoot to Edge of Bed Supine to Sit: 4: Min assist;HOB flat Sitting - Scoot to [place] of Bed: 4: Min assist Details for Bed Mobility Assistance: assist to for Lt. LE Transfers Transfers: Sit to Stand;Stand to Sit Sit to Stand: 4: Min guard;With upper extremity assist;From bed;From chair/3-in-1 Stand to Sit: 4: Min guard;With upper extremity assist;To chair/3-in-1 Details for Transfer Assistance: min guard for safety     Exercises  Total Joint Exercises Ankle Circles/Pumps: AROM;Both;10 reps;Seated Quad Sets: AROM;Left;10 reps;Supine Heel Slides: AAROM;Left;10 reps;Supine Hip ABduction/ADduction: AAROM;Left;10 reps;Seated Knee Flexion: AAROM;Left;Seated;5 reps   Balance     End of Session OT - End of Session Equipment Utilized During Treatment: Left knee immobilizer Activity Tolerance: Patient tolerated treatment well Patient left: in chair;with call bell/phone within  reach;with family/visitor present  GO     Jeani Hawking M 02/28/2013, 12:48 PM

## 2013-02-28 NOTE — Progress Notes (Signed)
Physical Therapy Treatment Patient Details Name: Maria Hancock MRN: [id number] DOB: 09/29/1966 Today's Date: 02/28/2013 Time: 1191-4782 PT Time Calculation (min): 28 min  PT Assessment / Plan / Recommendation Comments on Treatment Session  Patient continues to progress slowly. Encouragement required. Will continue to progress ambulation this afternoon as tolerated    Follow Up Recommendations  Home health PT;Supervision/Assistance - 24 hour     Does the patient have the potential to tolerate intense rehabilitation     Barriers to Discharge        Equipment Recommendations  None recommended by PT    Recommendations for Other Services    Frequency 7X/week   Plan Discharge plan remains appropriate;Frequency remains appropriate    Precautions / Restrictions Precautions Precautions: Knee;Fall Required Braces or Orthoses: Knee Immobilizer - Left Knee Immobilizer - Left: On except when in CPM;On when out of bed or walking Restrictions Weight Bearing Restrictions: Yes LLE Weight Bearing: Weight bearing as tolerated   Pertinent Vitals/Pain     Mobility  Bed Mobility Bed Mobility: Sit to Supine Supine to Sit: 4: Min assist;With rails;HOB elevated Sitting - Scoot to Edge of Bed: 4: Min assist Sit to Supine: 4: Min assist Details for Bed Mobility Assistance: (A) to advance L LE into bed Cues for best positioning Transfers Sit to Stand: From bed;4: Min guard Stand to Sit: 4: Min guard;To elevated surface;To bed Details for Transfer Assistance: MinGuard for safety. Cues for best technique Ambulation/Gait Ambulation/Gait Assistance: 4: Min guard Ambulation Distance (Feet): 50 Feet Assistive device: Rolling walker Ambulation/Gait Assistance Details:  Cues for posture. Patient with several pauses during ambulation. Patient with good L heel strike Gait Pattern: Step-to pattern;Decreased stance time - left;Decreased step length - right Gait velocity: decreased due to pain      Exercises Total Joint Exercises Ankle Circles/Pumps: AROM;Both;10 reps;Seated Quad Sets: AROM;Left;10 reps;Supine Heel Slides: AAROM;Left;10 reps;Supine Hip ABduction/ADduction: AAROM;Left;10 reps;Seated Knee Flexion: AAROM;Left;Seated;5 reps   PT Diagnosis:    PT Problem List:   PT Treatment Interventions:     PT Goals Acute Rehab PT Goals PT Goal: Supine/Side to Sit - Progress: Progressing toward goal PT Goal: Sit to Supine/Side - Progress: Progressing toward goal PT Goal: Sit to Stand - Progress: Progressing toward goal PT Goal: Stand to Sit - Progress: Progressing toward goal PT Goal: Ambulate - Progress: Progressing toward goal PT Goal: Perform Home Exercise Program - Progress: Progressing toward goal  Visit Information  Last PT Received On: 02/28/13 Assistance Needed: +1    Subjective Data      Cognition  Cognition Arousal/Alertness: Awake/alert Behavior During Therapy: WFL for tasks assessed/performed Overall Cognitive Status: Within Functional Limits for tasks assessed    Balance     End of Session PT - End of Session Equipment Utilized During Treatment: Gait belt;Left knee immobilizer Activity Tolerance: Patient limited by pain Patient left: with call bell/phone within reach;with family/visitor present;in bed;in CPM Nurse Communication: Mobility status CPM Left Knee CPM Left Knee: Off   GP     Fredrich Birks 02/28/2013, 8:54 AM 02/28/2013 Fredrich Birks PTA [phone] pager [phone] office

## 2013-02-28 NOTE — Progress Notes (Signed)
Physical Therapy Treatment Patient Details Name: Maria Hancock MRN: [id number] DOB: 06-14-1966 Today's Date: 02/28/2013 Time: 6644-0347 PT Time Calculation (min): 26 min  PT Assessment / Plan / Recommendation Comments on Treatment Session  pt slowly progressing. unable to progress amb this afternoon due to pain. Will cont to f/u with pt to maxmize functional mobility to ensure safe transition home.    Follow Up Recommendations  Home health PT;Supervision/Assistance - 24 hour     Does the patient have the potential to tolerate intense rehabilitation     Barriers to Discharge        Equipment Recommendations  None recommended by PT    Recommendations for Other Services    Frequency 7X/week   Plan Discharge plan remains appropriate;Frequency remains appropriate    Precautions / Restrictions Precautions Precautions: Knee;Fall Precaution Booklet Issued: Yes (comment) Required Braces or Orthoses: Knee Immobilizer - Left Knee Immobilizer - Left: On except when in CPM;On when out of bed or walking Restrictions Weight Bearing Restrictions: Yes LLE Weight Bearing: Weight bearing as tolerated   Pertinent Vitals/Pain 8/10 prior to treatment; 7/10 post treatment; pt premedicated.     Mobility  Bed Mobility Bed Mobility: Sit to Supine Supine to Sit: 4: Min assist;HOB flat Sitting - Scoot to Edge of Bed: 4: Min assist Sit to Supine: 4: Min assist;HOB flat;With rail Details for Bed Mobility Assistance: (A) to advance L LE into bed Transfers Transfers: Sit to Stand;Stand to Sit Sit to Stand: 4: Min guard;From chair/3-in-1;With armrests;With upper extremity assist Stand to Sit: 4: Min guard;To bed;To elevated surface Details for Transfer Assistance: min gaurd for safety; requires cues for hand placement and safety  Ambulation/Gait Ambulation/Gait Assistance: 4: Min guard Ambulation Distance (Feet): 20 Feet Assistive device: Rolling walker Ambulation/Gait Assistance Details:  verbal cues for gt sequencing and correct upright posture; required  multiple rest breaks to rest  Gait Pattern: Step-to pattern;Decreased stance time - left;Decreased step length - right Gait velocity: decreased due to pain  Stairs: No Wheelchair Mobility Wheelchair Mobility: No    Exercises Total Joint Exercises Ankle Circles/Pumps: AROM;Both;10 reps;Seated Heel Slides: AAROM;Left;10 reps;Seated Hip ABduction/ADduction: AAROM;Left;10 reps;Supine Straight Leg Raises: AAROM;Left;5 reps;Supine   PT Diagnosis:    PT Problem List:   PT Treatment Interventions:     PT Goals Acute Rehab PT Goals PT Goal Formulation: With patient Time For Goal Achievement: 03/03/13 Potential to Achieve Goals: Good PT Goal: Sit to Supine/Side - Progress: Progressing toward goal PT Goal: Sit to Stand - Progress: Progressing toward goal PT Goal: Stand to Sit - Progress: Progressing toward goal PT Goal: Ambulate - Progress: Progressing toward goal PT Goal: Perform Home Exercise Program - Progress: Progressing toward goal  Visit Information  Last PT Received On: 02/28/13 Assistance Needed: +1    Subjective Data  Subjective: pt sitting in chair; agreeable to therapy; states " i may not make it far but ill try" Patient Stated Goal: home with husband and son   Cognition  Cognition Arousal/Alertness: Awake/alert Behavior During Therapy: WFL for tasks assessed/performed Overall Cognitive Status: Within Functional Limits for tasks assessed    Balance  Balance Balance Assessed: No  End of Session PT - End of Session Equipment Utilized During Treatment: Gait belt;Left knee immobilizer Activity Tolerance: Patient limited by pain Patient left: in bed;with call bell/phone within reach Nurse Communication: Mobility status CPM Left Knee CPM Left Knee: On Left Knee Flexion (Degrees): 50 Left Knee Extension (Degrees): 0   GP     [place], [place]  N, PT [id number] 02/28/2013, 2:00 PM

## 2013-02-28 NOTE — Progress Notes (Addendum)
ANTICOAGULATION CONSULT NOTE - Follow Up Consult  Pharmacy Consult for Warfarin Indication: VTE prophylaxis s/p L-TKR on 5/27  No Known Allergies  Patient Measurements: Height: 5' 4.96" (165 cm) Weight: 276 lb 3.8 oz (125.3 kg) IBW/kg (Calculated) : 56.91  Vital Signs: Temp: 98.7 F (37.1 C) (05/29 0500) Temp src: Oral (05/29 0500) BP: 137/68 mmHg (05/29 0500)  Labs:  Recent Labs  02/27/13 0510 02/28/13 0510  HGB 12.0 12.0  HCT 34.9* 34.3*  PLT 248 232  LABPROT 13.7 17.3*  INR 1.06 1.46  CREATININE 0.67  --     Estimated Creatinine Clearance: 119.4 ml/min (by C-G formula based on Cr of 0.67).   Assessment: 47 y.o. F who continues on warfarin + SCDs for VTE prophylaxis s/p L-TKR on 5/27. INR this morning is 1.46 with trend up on day 3 of coumadin.  Goal of Therapy:  INR 2-3   Plan:  1. Warfarin 7.5 mg x 1 dose at 1800 today 2. Will continue to monitor for any signs/symptoms of bleeding and will follow up with PT/INR in the a.m.   Harland German, Pharm D 02/28/2013 11:12 AM   e

## 2013-02-28 NOTE — Progress Notes (Signed)
Subjective: Pt stable - amb in room   Objective: Vital signs in last 24 hours: Temp:  [98.7 F (37.1 C)-98.9 F (37.2 C)] 98.7 F (37.1 C) (05/29 0500) Pulse Rate:  [109-115] 109 (05/28 2030) Resp:  [17-18] 18 (05/29 0500) BP: (134-143)/(68-87) 137/68 mmHg (05/29 0500) SpO2:  [97 %-98 %] 97 % (05/29 0500) Weight:  [125.3 kg (276 lb 3.8 oz)] 125.3 kg (276 lb 3.8 oz) (05/28 0800)  Intake/Output from previous day: 05/28 0701 - 05/29 0700 In: 240 [P.O.:240] Out: 450 [Urine:450] Intake/Output this shift:    Exam:  Neurovascular intact Sensation intact distally Intact pulses distally  Labs:  Recent Labs  02/27/13 0510 02/28/13 0510  HGB 12.0 12.0    Recent Labs  02/27/13 0510 02/28/13 0510  WBC 9.4 11.2*  RBC 3.80* 3.78*  HCT 34.9* 34.3*  PLT 248 232    Recent Labs  02/27/13 0510  NA 134*  K 3.8  CL 97  CO2 27  BUN 15  CREATININE 0.67  GLUCOSE 151*  CALCIUM 8.7    Recent Labs  02/27/13 0510 02/28/13 0510  INR 1.06 1.46    Assessment/Plan: Pt stable - tol CPM - amb in room - inr increasing - possible dc fri vs sat   Maria Hancock 02/28/2013, 7:08 AM

## 2013-02-28 NOTE — Progress Notes (Signed)
Orthopedic Tech Progress Note Patient Details:  Maria Hancock August 23, 1966 [id number] Patient in CPM CPM Left Knee CPM Left Knee: On Left Knee Flexion (Degrees): 50 Left Knee Extension (Degrees): 0   Asia R Thompson 02/28/2013, 4:01 PM

## 2013-03-01 LAB — CBC
Hemoglobin: 11.2 g/dL — ABNORMAL LOW (ref 12.0–15.0)
MCH: 31.9 pg (ref 26.0–34.0)
MCHC: 34.8 g/dL (ref 30.0–36.0)
RDW: 13.7 % (ref 11.5–15.5)

## 2013-03-01 LAB — PROTIME-INR
INR: 1.95 — ABNORMAL HIGH (ref 0.00–1.49)
Prothrombin Time: 21.5 seconds — ABNORMAL HIGH (ref 11.6–15.2)

## 2013-03-01 MED ORDER — WARFARIN SODIUM 5 MG PO TABS
5.0000 mg | ORAL_TABLET | Freq: Once | ORAL | Status: AC
  Administered 2013-03-01: 5 mg via ORAL
  Filled 2013-03-01: qty 1

## 2013-03-01 MED ORDER — METHOCARBAMOL 500 MG PO TABS: 500.0000 mg | ORAL_TABLET | Freq: Four times a day (QID) | ORAL | Status: DC | PRN

## 2013-03-01 MED ORDER — DOXYCYCLINE HYCLATE 100 MG PO TABS: 100.0000 mg | ORAL_TABLET | Freq: Two times a day (BID) | ORAL | Status: DC

## 2013-03-01 MED ORDER — OXYCODONE HCL ER 10 MG PO T12A: 10.0000 mg | EXTENDED_RELEASE_TABLET | Freq: Two times a day (BID) | ORAL | Status: DC

## 2013-03-01 MED ORDER — WARFARIN SODIUM 5 MG PO TABS: 5.0000 mg | ORAL_TABLET | Freq: Every day | ORAL | Status: DC

## 2013-03-01 MED ORDER — DOXYCYCLINE HYCLATE 50 MG PO CAPS: 100.0000 mg | ORAL_CAPSULE | Freq: Two times a day (BID) | ORAL | Status: DC

## 2013-03-01 MED ORDER — DOXYCYCLINE HYCLATE 100 MG PO TABS
100.0000 mg | ORAL_TABLET | Freq: Two times a day (BID) | ORAL | Status: DC
  Administered 2013-03-01 – 2013-03-02 (×2): 100 mg via ORAL
  Filled 2013-03-01 (×3): qty 1

## 2013-03-01 MED ORDER — OXYCODONE HCL 5 MG PO TABS: 5.0000 mg | ORAL_TABLET | ORAL | Status: DC | PRN

## 2013-03-01 NOTE — Progress Notes (Signed)
Subjective: Pt stable - mobilizing well - incision slight proximal redness   Objective: Vital signs in last 24 hours: Temp:  [97.1 F (36.2 C)-98.7 F (37.1 C)] 98.7 F (37.1 C) (05/30 1429) Pulse Rate:  [102-118] 118 (05/30 1429) Resp:  [18] 18 (05/30 1429) BP: (137-143)/(71-92) 143/92 mmHg (05/30 1429) SpO2:  [97 %-99 %] 97 % (05/30 1429)  Intake/Output from previous day:   Intake/Output this shift:    Exam:  Neurovascular intact Sensation intact distally Intact pulses distally  Labs:  Recent Labs  02/27/13 0510 02/28/13 0510 03/01/13 0514  HGB 12.0 12.0 11.2*    Recent Labs  02/28/13 0510 03/01/13 0514  WBC 11.2* 10.1  RBC 3.78* 3.51*  HCT 34.3* 32.2*  PLT 232 225    Recent Labs  02/27/13 0510  NA 134*  K 3.8  CL 97  CO2 27  BUN 15  CREATININE 0.67  GLUCOSE 151*  CALCIUM 8.7    Recent Labs  02/28/13 0510 03/01/13 0514  INR 1.46 1.95*    Assessment/Plan: Plan dc for am - will check incision then - will place on doxy for 7 days   DEAN,GREGORY SCOTT 03/01/2013, 8:02 PM

## 2013-03-01 NOTE — Progress Notes (Signed)
Physical Therapy Treatment Patient Details Name: Maria Hancock MRN: [id number] DOB: 05-06-1966 Today's Date: 03/01/2013 Time: 6962-9528 PT Time Calculation (min): 25 min  PT Assessment / Plan / Recommendation Comments on Treatment Session  Patient progressing better today. Able to tolerate stair training and increased ambulation. Patient is safe to DC home today based on goals set on eval. Awaiting MD to round on patient today    Follow Up Recommendations  Home health PT;Supervision/Assistance - 24 hour     Does the patient have the potential to tolerate intense rehabilitation     Barriers to Discharge        Equipment Recommendations  None recommended by PT    Recommendations for Other Services    Frequency 7X/week   Plan Discharge plan remains appropriate;Frequency remains appropriate    Precautions / Restrictions Precautions Precautions: Knee;Fall Required Braces or Orthoses: Knee Immobilizer - Left Knee Immobilizer - Left: On except when in CPM;On when out of bed or walking Restrictions Weight Bearing Restrictions: Yes LLE Weight Bearing: Weight bearing as tolerated   Pertinent Vitals/Pain     Mobility  Bed Mobility Bed Mobility: Not assessed Transfers Sit to Stand: 5: Supervision;With upper extremity assist;With armrests;From chair/3-in-1 Stand to Sit: 5: Supervision;With upper extremity assist;With armrests;To chair/3-in-1 Details for Transfer Assistance: S for safety. No cues required for technique Ambulation/Gait Ambulation/Gait Assistance: 4: Min guard Ambulation Distance (Feet): 70 Feet Assistive device: Rolling walker Ambulation/Gait Assistance Details: Cues for posture.  Gait Pattern: Step-to pattern;Decreased stance time - left;Decreased step length - right Gait velocity: increasing Stairs: Yes Stairs Assistance: 4: Min assist Stairs Assistance Details (indicate cue type and reason): A for safety and balance. Cues for technique and sequency Stair  Management Technique: Two rails;Step to pattern;Forwards Number of Stairs: 2    Exercises Total Joint Exercises Quad Sets: AROM;Left;10 reps Heel Slides: AAROM;Left;10 reps Hip ABduction/ADduction: AAROM;Left;10 reps Straight Leg Raises: AAROM;Left;10 reps   PT Diagnosis:    PT Problem List:   PT Treatment Interventions:     PT Goals Acute Rehab PT Goals PT Goal: Sit to Stand - Progress: Progressing toward goal PT Goal: Stand to Sit - Progress: Progressing toward goal PT Goal: Ambulate - Progress: Progressing toward goal PT Goal: Up/Down Stairs - Progress: Progressing toward goal PT Goal: Perform Home Exercise Program - Progress: Progressing toward goal  Visit Information  Last PT Received On: 03/01/13 Assistance Needed: +1    Subjective Data      Cognition  Cognition Arousal/Alertness: Awake/alert Behavior During Therapy: WFL for tasks assessed/performed Overall Cognitive Status: Within Functional Limits for tasks assessed    Balance     End of Session PT - End of Session Equipment Utilized During Treatment: Gait belt;Left knee immobilizer Activity Tolerance: Patient tolerated treatment well Patient left: in bed;with call bell/phone within reach CPM Left Knee CPM Left Knee: Off   GP     Fredrich Birks 03/01/2013, 11:41 AM  03/01/2013 Fredrich Birks PTA [phone] pager [phone] office

## 2013-03-01 NOTE — Progress Notes (Signed)
Physical Therapy Treatment Patient Details Name: Maria Hancock MRN: [id number] DOB: October 07, 1965 Today's Date: 03/01/2013 Time: 7829-5621 PT Time Calculation (min): 29 min  PT Assessment / Plan / Recommendation Comments on Treatment Session  Patient continuing to make good progress. If patient does not discharge today, she will need to review stairs prior to DC home    Follow Up Recommendations  Home health PT;Supervision/Assistance - 24 hour     Does the patient have the potential to tolerate intense rehabilitation     Barriers to Discharge        Equipment Recommendations  None recommended by PT    Recommendations for Other Services    Frequency 7X/week   Plan Discharge plan remains appropriate;Frequency remains appropriate    Precautions / Restrictions Precautions Precautions: Knee;Fall Required Braces or Orthoses: Knee Immobilizer - Left Knee Immobilizer - Left: On except when in CPM;On when out of bed or walking Restrictions LLE Weight Bearing: Weight bearing as tolerated   Pertinent Vitals/Pain     Mobility  Bed Mobility Bed Mobility: Not assessed Supine to Sit: 4: Min assist;HOB flat Sit to Supine: 4: Min assist;HOB flat;With rail Details for Bed Mobility Assistance: A for R LE in and out of bed to decrease pain Transfers Sit to Stand: 5: Supervision;With upper extremity assist;With armrests;From chair/3-in-1;From bed Stand to Sit: 5: Supervision;With upper extremity assist;With armrests;To chair/3-in-1;To bed Details for Transfer Assistance: S for safety. No cues required for technique Ambulation/Gait Ambulation/Gait Assistance: 5: Supervision Ambulation Distance (Feet): 80 Feet Assistive device: Rolling walker Ambulation/Gait Assistance Details: Cues for posture.  Gait Pattern: Decreased stride length;Step-through pattern Gait velocity: increasing Stairs: Yes Stairs Assistance: 4: Min assist Stairs Assistance Details (indicate cue type and reason): A  for safety and balance. Cues for technique and sequency Stair Management Technique: Two rails;Step to pattern;Forwards Number of Stairs: 2    Exercises Total Joint Exercises Quad Sets: AROM;Left;10 reps Heel Slides: AAROM;Left;10 reps Hip ABduction/ADduction: AAROM;Left;10 reps Straight Leg Raises: AAROM;Left;10 reps   PT Diagnosis:    PT Problem List:   PT Treatment Interventions:     PT Goals Acute Rehab PT Goals PT Goal: Supine/Side to Sit - Progress: Progressing toward goal PT Goal: Sit to Supine/Side - Progress: Progressing toward goal PT Goal: Sit to Stand - Progress: Progressing toward goal PT Goal: Stand to Sit - Progress: Progressing toward goal PT Goal: Ambulate - Progress: Progressing toward goal PT Goal: Up/Down Stairs - Progress: Progressing toward goal PT Goal: Perform Home Exercise Program - Progress: Progressing toward goal  Visit Information  Last PT Received On: 03/01/13 Assistance Needed: +1    Subjective Data      Cognition  Cognition Arousal/Alertness: Awake/alert Behavior During Therapy: WFL for tasks assessed/performed Overall Cognitive Status: Within Functional Limits for tasks assessed    Balance     End of Session PT - End of Session Equipment Utilized During Treatment: Gait belt;Left knee immobilizer Activity Tolerance: Patient tolerated treatment well Patient left: in bed;with call bell/phone within reach;in CPM Nurse Communication: Mobility status CPM Left Knee CPM Left Knee: On Left Knee Flexion (Degrees): 60 Left Knee Extension (Degrees): 0   GP     Robinette, Adline Potter 03/01/2013, 2:01 PM 03/01/2013 Fredrich Birks PTA [phone] pager [phone] office

## 2013-03-01 NOTE — Progress Notes (Signed)
ANTICOAGULATION CONSULT NOTE - Follow Up Consult  Pharmacy Consult for Warfarin Indication: VTE prophylaxis s/p L-TKR on 5/27  No Known Allergies  Patient Measurements: Height: 5' 4.96" (165 cm) Weight: 276 lb 3.8 oz (125.3 kg) IBW/kg (Calculated) : 56.91  Vital Signs: Temp: 97.1 F (36.2 C) (05/30 0644) BP: 140/76 mmHg (05/30 0644) Pulse Rate: 102 (05/30 0644)  Labs:  Recent Labs  02/27/13 0510 02/28/13 0510 03/01/13 0514  HGB 12.0 12.0 11.2*  HCT 34.9* 34.3* 32.2*  PLT 248 232 225  LABPROT 13.7 17.3* 21.5*  INR 1.06 1.46 1.95*  CREATININE 0.67  --   --     Estimated Creatinine Clearance: 119.4 ml/min (by C-G formula based on Cr of 0.67).   Assessment: 47 y.o. F who continues on warfarin + SCDs for VTE prophylaxis s/p L-TKR on 5/27. INR this morning is 1.95 with trend up on day 4 of coumadin.  Goal of Therapy:  INR 2-3   Plan:  1. Warfarin 5 mg x 1 dose at 1800 today 2. Will continue to monitor for any signs/symptoms of bleeding and will follow up with PT/INR in the a.m.  3.  If discharged, recommend Coumadin 5mg  daily with a PT/INR check on Monday 6/2.  Estella Husk, Pharm.D., BCPS Clinical Pharmacist  Phone [phone] or [phone] Pager [phone] 03/01/2013, 10:46 AM

## 2013-03-02 LAB — PROTIME-INR
INR: 1.92 — ABNORMAL HIGH (ref 0.00–1.49)
Prothrombin Time: 21.2 seconds — ABNORMAL HIGH (ref 11.6–15.2)

## 2013-03-02 NOTE — Discharge Summary (Signed)
Physician Discharge Summary  Patient ID: Maria Hancock MRN: [id number] DOB/AGE: December 24, 1965 47 y.o.  Admit date: 02/26/2013 Discharge date: 03/02/2013  Admission Diagnoses:  Principal Problem:   Osteoarthritis of left knee   Discharge Diagnoses:  Same  Surgeries: Procedure(s): TOTAL KNEE ARTHROPLASTY HARDWARE REMOVAL on 02/26/2013   Consultants:    Discharged Condition: Stable  Hospital Course: Maria Hancock is an 47 y.o. female who was admitted 02/26/2013 with a chief complaint of left knee pain, and found to have a diagnosis of Osteoarthritis of left knee.  They were brought to the operating room on 02/26/2013 and underwent the above named procedures. She tolerated PT well and was ambulating in hall at time of dc. Slight amount of redness proximal incision to be rxed prophylactically with doxy for 10 days   Antibiotics given:  Anti-infectives   Start     Dose/Rate Route Frequency Ordered Stop   03/01/13 2200  doxycycline (VIBRA-TABS) tablet 100 mg     100 mg Oral Every 12 hours 03/01/13 2009     03/01/13 0000  doxycycline (VIBRAMYCIN) 50 MG capsule     100 mg Oral 2 times daily 03/01/13 2008     03/01/13 0000  doxycycline (VIBRA-TABS) 100 MG tablet     100 mg Oral Every 12 hours 03/01/13 2010     02/26/13 1700  ceFAZolin (ANCEF) IVPB 2 g/50 mL premix     2 g 100 mL/hr over 30 Minutes Intravenous Every 6 hours 02/26/13 1609 02/27/13 0006   02/26/13 0600  ceFAZolin (ANCEF) 3 g in dextrose 5 % 50 mL IVPB     3 g 160 mL/hr over 30 Minutes Intravenous On call to O.R. 02/25/13 1313 02/26/13 1105    .  Recent vital signs:  Filed Vitals:   03/02/13 0652  BP: 130/87  Pulse: 106  Temp: 98.9 F (37.2 C)  Resp: 18    Recent laboratory studies:  Results for orders placed during the hospital encounter of 02/26/13  HCG, SERUM, QUALITATIVE      Result Value Range   Preg, Serum NEGATIVE  NEGATIVE  PROTIME-INR      Result Value Range   Prothrombin Time 13.7  11.6 -  15.2 seconds   INR 1.06  0.00 - 1.49  CBC      Result Value Range   WBC 9.4  4.0 - 10.5 K/uL   RBC 3.80 (*) 3.87 - 5.11 MIL/uL   Hemoglobin 12.0  12.0 - 15.0 g/dL   HCT 40.9 (*) 81.1 - 91.4 %   MCV 91.8  78.0 - 100.0 fL   MCH 31.6  26.0 - 34.0 pg   MCHC 34.4  30.0 - 36.0 g/dL   RDW 78.2  95.6 - 21.3 %   Platelets 248  150 - 400 K/uL  BASIC METABOLIC PANEL      Result Value Range   Sodium 134 (*) 135 - 145 mEq/L   Potassium 3.8  3.5 - 5.1 mEq/L   Chloride 97  96 - 112 mEq/L   CO2 27  19 - 32 mEq/L   Glucose, Bld 151 (*) 70 - 99 mg/dL   BUN 15  6 - 23 mg/dL   Creatinine, Ser 0.86  0.50 - 1.10 mg/dL   Calcium 8.7  8.4 - 57.8 mg/dL   GFR calc non Af Amer >90  >90 mL/min   GFR calc Af Amer >90  >90 mL/min  PROTIME-INR      Result Value Range   Prothrombin  Time 17.3 (*) 11.6 - 15.2 seconds   INR 1.46  0.00 - 1.49  CBC      Result Value Range   WBC 11.2 (*) 4.0 - 10.5 K/uL   RBC 3.78 (*) 3.87 - 5.11 MIL/uL   Hemoglobin 12.0  12.0 - 15.0 g/dL   HCT 16.1 (*) 09.6 - 04.5 %   MCV 90.7  78.0 - 100.0 fL   MCH 31.7  26.0 - 34.0 pg   MCHC 35.0  30.0 - 36.0 g/dL   RDW 40.9  81.1 - 91.4 %   Platelets 232  150 - 400 K/uL  PROTIME-INR      Result Value Range   Prothrombin Time 21.5 (*) 11.6 - 15.2 seconds   INR 1.95 (*) 0.00 - 1.49  CBC      Result Value Range   WBC 10.1  4.0 - 10.5 K/uL   RBC 3.51 (*) 3.87 - 5.11 MIL/uL   Hemoglobin 11.2 (*) 12.0 - 15.0 g/dL   HCT 78.2 (*) 95.6 - 21.3 %   MCV 91.7  78.0 - 100.0 fL   MCH 31.9  26.0 - 34.0 pg   MCHC 34.8  30.0 - 36.0 g/dL   RDW 08.6  57.8 - 46.9 %   Platelets 225  150 - 400 K/uL  PROTIME-INR      Result Value Range   Prothrombin Time 21.2 (*) 11.6 - 15.2 seconds   INR 1.92 (*) 0.00 - 1.49    Discharge Medications:     Medication List    STOP taking these medications       cyclobenzaprine 5 MG tablet  Commonly known as:  FLEXERIL     meloxicam 15 MG tablet  Commonly known as:  MOBIC     oxyCODONE-acetaminophen  5-325 MG per tablet  Commonly known as:  PERCOCET/ROXICET      TAKE these medications       doxycycline 50 MG capsule  Commonly known as:  VIBRAMYCIN  Take 2 capsules (100 mg total) by mouth 2 (two) times daily.     doxycycline 100 MG tablet  Commonly known as:  VIBRA-TABS  Take 1 tablet (100 mg total) by mouth every 12 (twelve) hours.     levonorgestrel 20 MCG/24HR IUD  Commonly known as:  MIRENA  1 each by Intrauterine route once.     methocarbamol 500 MG tablet  Commonly known as:  ROBAXIN  Take 1 tablet (500 mg total) by mouth every 6 (six) hours as needed.     oxyCODONE 5 MG immediate release tablet  Commonly known as:  Oxy IR/ROXICODONE  Take 1-2 tablets (5-10 mg total) by mouth every 3 (three) hours as needed.     OxyCODONE 10 mg T12a  Commonly known as:  OXYCONTIN  Take 1 tablet (10 mg total) by mouth every 12 (twelve) hours.     polyethylene glycol packet  Commonly known as:  MIRALAX / GLYCOLAX  Take 17 g by mouth daily as needed (constipation).     sertraline 100 MG tablet  Commonly known as:  ZOLOFT  Take 100 mg by mouth daily.     warfarin 5 MG tablet  Commonly known as:  COUMADIN  Take 1 tablet (5 mg total) by mouth daily.        Diagnostic Studies: Dg Chest 2 View  02/19/2013   *RADIOLOGY REPORT*  Clinical Data: Preoperative for total knee replacement  CHEST - 2 VIEW  Comparison: November 11, 2009  Findings: Lungs  clear.  The heart size and pulmonary vascularity are normal.  No adenopathy.  No bone lesions.  IMPRESSION: No edema or consolidation.   Original Report Authenticated By: Bretta Bang, M.D.    Disposition: Final discharge disposition not confirmed      Discharge Orders   Future Orders Complete By Expires     Call MD / Call 911  As directed     Comments:      If you experience chest pain or shortness of breath, CALL 911 and be transported to the hospital emergency room.  If you develope a fever above 101 F, pus (white drainage) or  increased drainage or redness at the wound, or calf pain, call your surgeon's office.    Call MD / Call 911  As directed     Comments:      If you experience chest pain or shortness of breath, CALL 911 and be transported to the hospital emergency room.  If you develope a fever above 101 F, pus (white drainage) or increased drainage or redness at the wound, or calf pain, call your surgeon's office.    Constipation Prevention  As directed     Comments:      Drink plenty of fluids.  Prune juice may be helpful.  You may use a stool softener, such as Colace (over the counter) 100 mg twice a day.  Use MiraLax (over the counter) for constipation as needed.    Constipation Prevention  As directed     Comments:      Drink plenty of fluids.  Prune juice may be helpful.  You may use a stool softener, such as Colace (over the counter) 100 mg twice a day.  Use MiraLax (over the counter) for constipation as needed.    Diet - low sodium heart healthy  As directed     Diet - low sodium heart healthy  As directed     Discharge instructions  As directed     Comments:      Keep incision dry CPM 2 hours per 8 Home health PT    Increase activity slowly as tolerated  As directed     Increase activity slowly as tolerated  As directed           Signed: DEAN,GREGORY SCOTT 03/02/2013, 8:00 AM

## 2013-03-02 NOTE — Progress Notes (Signed)
Physical Therapy Treatment Patient Details Name: Maria Hancock MRN: [id number] DOB: [birth year] Today's Date: 03/02/2013 Time: 4098-1191 PT Time Calculation (min): 23 min  PT Assessment / Plan / Recommendation Comments on Treatment Session  Patient making great progress with ambulation. Eager to DC home today. Declined further stair training    Follow Up Recommendations  Home health PT;Supervision/Assistance - 24 hour     Does the patient have the potential to tolerate intense rehabilitation     Barriers to Discharge        Equipment Recommendations  None recommended by PT    Recommendations for Other Services    Frequency 7X/week   Plan Discharge plan remains appropriate;Frequency remains appropriate    Precautions / Restrictions Precautions Precautions: Knee;Fall Precaution Booklet Issued: Yes (comment) Required Braces or Orthoses: Knee Immobilizer - Left Knee Immobilizer - Left: On except when in CPM;On when out of bed or walking Restrictions LLE Weight Bearing: Weight bearing as tolerated   Pertinent Vitals/Pain     Mobility  Bed Mobility Supine to Sit: 5: Supervision Sitting - Scoot to Edge of Bed: 5: Supervision Details for Bed Mobility Assistance: Cues for R LE positioning Transfers Sit to Stand: 6: Modified independent (Device/Increase time) Stand to Sit: 6: Modified independent (Device/Increase time) Ambulation/Gait Ambulation/Gait Assistance: 5: Supervision Ambulation Distance (Feet): 120 Feet Assistive device: Rolling walker Gait Pattern: Step-through pattern    Exercises Total Joint Exercises Quad Sets: AROM;Left;10 reps Heel Slides: Left;10 reps;AROM Hip ABduction/ADduction: Left;10 reps;AROM Straight Leg Raises: Left;10 reps;AROM Long Arc Quad: Left;10 reps;Seated;AROM   PT Diagnosis:    PT Problem List:   PT Treatment Interventions:     PT Goals Acute Rehab PT Goals PT Goal: Supine/Side to Sit - Progress: Progressing toward goal PT  Goal: Sit to Stand - Progress: Met PT Goal: Stand to Sit - Progress: Met PT Goal: Ambulate - Progress: Progressing toward goal PT Goal: Perform Home Exercise Program - Progress: Met  Visit Information  Last PT Received On: 03/02/13 Assistance Needed: +1    Subjective Data      Cognition  Cognition Arousal/Alertness: Awake/alert Behavior During Therapy: WFL for tasks assessed/performed Overall Cognitive Status: Within Functional Limits for tasks assessed    Balance     End of Session PT - End of Session Equipment Utilized During Treatment: Gait belt;Left knee immobilizer Activity Tolerance: Patient tolerated treatment well Patient left: in chair;with call bell/phone within reach   GP     Fredrich Birks 03/02/2013, 9:55 AM 03/02/2013 Fredrich Birks PTA [phone] pager [phone] office

## 2013-03-02 NOTE — Progress Notes (Signed)
Subjective: Pt stable - ready for dc   Objective: Vital signs in last 24 hours: Temp:  [98.6 F (37 C)-98.9 F (37.2 C)] 98.9 F (37.2 C) (05/31 0652) Pulse Rate:  [105-118] 106 (05/31 0652) Resp:  [18-20] 18 (05/31 0652) BP: (129-143)/(74-92) 130/87 mmHg (05/31 0652) SpO2:  [95 %-97 %] 96 % (05/31 0652)  Intake/Output from previous day:   Intake/Output this shift:    Exam:  Neurovascular intact Sensation intact distally Intact pulses distally  Labs:  Recent Labs  02/28/13 0510 03/01/13 0514  HGB 12.0 11.2*    Recent Labs  02/28/13 0510 03/01/13 0514  WBC 11.2* 10.1  RBC 3.78* 3.51*  HCT 34.3* 32.2*  PLT 232 225   No results found for this basename: NA, K, CL, CO2, BUN, CREATININE, GLUCOSE, CALCIUM,  in the last 72 hours  Recent Labs  03/01/13 0514 03/02/13 0555  INR 1.95* 1.92*    Assessment/Plan: Dc home   Henrique Parekh SCOTT 03/02/2013, 7:59 AM

## 2013-03-02 NOTE — Progress Notes (Signed)
Occupational Therapy Treatment Patient Details Name: Maria Hancock MRN: [id number] DOB: 24-Mar-1966 Today's Date: 03/02/2013 Time: 4098-1191 OT Time Calculation (min): 13 min  OT Assessment / Plan / Recommendation Comments on Treatment Session Pt performed toilet transfer. Pt did not feel the need to practice LB ADLs. Pt did attempt to practice simulated tub transfer, but did not complete due to pt feeling unsteady stepping over, so she decided that backing up to 3 in 1 in tub will be safer. Pt planning to d/c today.    Follow Up Recommendations  Home health OT;Supervision/Assistance - 24 hour    Barriers to Discharge       Equipment Recommendations  None recommended by OT    Recommendations for Other Services    Frequency Min 2X/week   Plan Discharge plan remains appropriate    Precautions / Restrictions Precautions Precautions: Knee;Fall Precaution Booklet Issued: Yes (comment) Required Braces or Orthoses: Knee Immobilizer - Left Knee Immobilizer - Left: On except when in CPM;On when out of bed or walking Restrictions Weight Bearing Restrictions: Yes LLE Weight Bearing: Weight bearing as tolerated   Pertinent Vitals/Pain Pt reported pain with mobilization, but did not rate. Repositioned.     ADL  Grooming: Wash/dry hands;Supervision/safety Where Assessed - Grooming: Supported standing Toilet Transfer: Performed Statistician Method: Sit to Barista: Comfort height toilet;Grab bars Toileting - Architect and Hygiene: Performed;Supervision/safety Where Assessed - Engineer, mining and Hygiene: Sit on 3-in-1 or toilet;Sit to stand from 3-in-1 or toilet (clothing-sit to stand and hygiene-sitting) Equipment Used: Gait belt;Knee Immobilizer;Rolling walker ADL Comments: Pt performed toilet transfer. Pt did not feel the need to practice LB ADLs. Pt did attempt to practice simulated tub transfer, but did not complete due to pt  feeling unsteady stepping over, so she decided that backing up to 3 in1 in tub will be safer.     OT Diagnosis:    OT Problem List:   OT Treatment Interventions:     OT Goals Acute Rehab OT Goals OT Goal Formulation: With patient Time For Goal Achievement: 03/13/13 Potential to Achieve Goals: Good ADL Goals Pt Will Perform Lower Body Bathing: with modified independence;Sit to stand from chair Pt Will Perform Lower Body Dressing: with modified independence;Sit to stand from bed;Sit to stand from chair Pt Will Transfer to Toilet: with modified independence;Ambulation;with DME ADL Goal: Toilet Transfer - Progress: Progressing toward goals Pt Will Perform Toileting - Clothing Manipulation: with modified independence;Standing ADL Goal: Toileting - Clothing Manipulation - Progress: Progressing toward goals Pt Will Perform Toileting - Hygiene: with modified independence;Sit to stand from 3-in-1/toilet;Sitting on 3-in-1 or toilet ADL Goal: Toileting - Hygiene - Progress: Progressing toward goals Pt Will Perform Tub/Shower Transfer: Tub transfer;with supervision;Ambulation;with DME  Visit Information  Last OT Received On: 03/02/13 Assistance Needed: +1    Subjective Data      Prior Functioning       Cognition  Cognition Arousal/Alertness: Awake/alert Behavior During Therapy: WFL for tasks assessed/performed Overall Cognitive Status: Within Functional Limits for tasks assessed    Mobility  Bed Mobility Bed Mobility: Not assessed Transfers Transfers: Sit to Stand;Stand to Sit Sit to Stand: 5: Supervision;With upper extremity assist;From chair/3-in-1 Stand to Sit: 5: Supervision;With upper extremity assist;To chair/3-in-1 Details for Transfer Assistance: supervision for safety    Exercises    Balance     End of Session OT - End of Session Equipment Utilized During Treatment: Gait belt;Left knee immobilizer Activity Tolerance: Patient tolerated treatment well Patient left:  in  chair;with family/visitor present Nurse Communication: Mobility status CPM Left Knee CPM Left Knee: Off  GO     Earlie Raveling OTR/L [phone] 03/02/2013, 12:41 PM

## 2014-01-24 ENCOUNTER — Encounter: Payer: Self-pay | Admitting: Nurse Practitioner

## 2014-01-31 ENCOUNTER — Encounter: Payer: Self-pay | Admitting: Nurse Practitioner

## 2014-11-04 ENCOUNTER — Other Ambulatory Visit: Payer: Self-pay | Admitting: Internal Medicine

## 2014-11-04 DIAGNOSIS — N63 Unspecified lump in unspecified breast: Secondary | ICD-10-CM

## 2014-11-12 ENCOUNTER — Ambulatory Visit
Admission: RE | Admit: 2014-11-12 | Discharge: 2014-11-12 | Disposition: A | Discharge status: Home / Self Care | Payer: 59 | Source: Ambulatory Visit | Attending: Internal Medicine | Admitting: Internal Medicine

## 2014-11-12 ENCOUNTER — Other Ambulatory Visit: Payer: Self-pay | Admitting: Internal Medicine

## 2014-11-12 DIAGNOSIS — N63 Unspecified lump in unspecified breast: Secondary | ICD-10-CM

## 2015-03-31 ENCOUNTER — Other Ambulatory Visit: Payer: Self-pay | Admitting: Orthopedic Surgery

## 2015-03-31 DIAGNOSIS — M545 Low back pain: Secondary | ICD-10-CM

## 2015-04-05 ENCOUNTER — Ambulatory Visit
Admission: RE | Admit: 2015-04-05 | Discharge: 2015-04-05 | Disposition: A | Discharge status: Home / Self Care | Payer: 59 | Source: Ambulatory Visit | Attending: Orthopedic Surgery | Admitting: Orthopedic Surgery

## 2015-04-05 DIAGNOSIS — M545 Low back pain: Secondary | ICD-10-CM

## 2015-04-17 ENCOUNTER — Emergency Department (HOSPITAL_COMMUNITY)
Admission: EM | Admit: 2015-04-17 | Discharge: 2015-04-17 | Disposition: A | Discharge status: Home / Self Care | Payer: 59 | Attending: Emergency Medicine | Admitting: Emergency Medicine

## 2015-04-17 ENCOUNTER — Encounter (HOSPITAL_COMMUNITY): Payer: Self-pay | Admitting: Emergency Medicine

## 2015-04-17 DIAGNOSIS — Z8679 Personal history of other diseases of the circulatory system: Secondary | ICD-10-CM | POA: Diagnosis not present

## 2015-04-17 DIAGNOSIS — R51 Headache: Secondary | ICD-10-CM | POA: Insufficient documentation

## 2015-04-17 DIAGNOSIS — Z8659 Personal history of other mental and behavioral disorders: Secondary | ICD-10-CM | POA: Insufficient documentation

## 2015-04-17 DIAGNOSIS — Z8719 Personal history of other diseases of the digestive system: Secondary | ICD-10-CM | POA: Insufficient documentation

## 2015-04-17 DIAGNOSIS — Z79899 Other long term (current) drug therapy: Secondary | ICD-10-CM | POA: Diagnosis not present

## 2015-04-17 DIAGNOSIS — M1990 Unspecified osteoarthritis, unspecified site: Secondary | ICD-10-CM | POA: Diagnosis not present

## 2015-04-17 DIAGNOSIS — Z87891 Personal history of nicotine dependence: Secondary | ICD-10-CM | POA: Diagnosis not present

## 2015-04-17 DIAGNOSIS — R519 Headache, unspecified: Secondary | ICD-10-CM

## 2015-04-17 HISTORY — DX: Migraine, unspecified, not intractable, without status migrainosus: G43.909

## 2015-04-17 MED ORDER — HALOPERIDOL LACTATE 5 MG/ML IJ SOLN
5.0000 mg | Freq: Once | INTRAMUSCULAR | Status: AC
  Administered 2015-04-17: 5 mg via INTRAVENOUS
  Filled 2015-04-17: qty 1

## 2015-04-17 MED ORDER — DIPHENHYDRAMINE HCL 50 MG/ML IJ SOLN
25.0000 mg | Freq: Once | INTRAMUSCULAR | Status: AC
  Administered 2015-04-17: 25 mg via INTRAVENOUS
  Filled 2015-04-17: qty 1

## 2015-04-17 MED ORDER — SODIUM CHLORIDE 0.9 % IV BOLUS (SEPSIS)
1000.0000 mL | Freq: Once | INTRAVENOUS | Status: AC
  Administered 2015-04-17: 1000 mL via INTRAVENOUS

## 2015-04-17 MED ORDER — PROCHLORPERAZINE EDISYLATE 5 MG/ML IJ SOLN
10.0000 mg | Freq: Once | INTRAMUSCULAR | Status: AC
  Administered 2015-04-17: 10 mg via INTRAVENOUS
  Filled 2015-04-17: qty 2

## 2015-04-17 MED ORDER — METOCLOPRAMIDE HCL 5 MG/ML IJ SOLN
10.0000 mg | Freq: Once | INTRAMUSCULAR | Status: AC
  Administered 2015-04-17: 10 mg via INTRAVENOUS
  Filled 2015-04-17: qty 2

## 2015-04-17 MED ORDER — TIZANIDINE HCL 4 MG PO CAPS: 4.0000 mg | ORAL_CAPSULE | Freq: Three times a day (TID) | ORAL | Status: DC | PRN

## 2015-04-17 NOTE — ED Notes (Signed)
Pt. woke up this morning with occipital migraine headache with emesis unrelieved by prescription pain medications . Denies fever or chills.

## 2015-04-17 NOTE — ED Provider Notes (Signed)
CSN: [id number]     Arrival date & time 04/17/15  0255 History   First MD Initiated Contact with Patient 04/17/15 [phone]     Chief Complaint  Patient presents with  . Migraine     (Consider location/radiation/quality/duration/timing/severity/associated sxs/prior Treatment) HPI Comments: Patient is a 49 year old female past medical history significant for migraine headaches, GERD, IBS, anxiety presenting to the emergency department for evaluation of posterior throbbing pulsing headache that began Monday and has continued. She states she saw her PCP on Wednesday was given a shot of Toradol and Phenergan and had some improvement but headache returned. She states she's been trying Imitrex at home prescribed at that visit with little to no improvement. Endorses nausea with many episodes of nonbloody nonbilious emesis. Denies any photophobia. No modifying factors identified. Patient denies any falls or trauma. Denies fevers, rashes, tick bites.   Past Medical History  Diagnosis Date  . Anxiety   . IBS (irritable bowel syndrome)   . Menorrhagia   . GERD (gastroesophageal reflux disease)     with Mobic  . Arthritis     "knees, back, hands" (02/26/2013)  . Migraine headache    Past Surgical History  Procedure Laterality Date  . Tubal ligation  1988  . Replacement total knee Left 02/26/2013    "w/hardware removal" (02/26/2013)  . Knee arthroplasty Left ?2001    "put hardware in" (02/26/2013)  . Knee arthroscopy Left     "I've had 2; 2nd one was 2013" (02/26/2013)  . Joint replacement    . Total knee arthroplasty Left 02/26/2013    Procedure: TOTAL KNEE ARTHROPLASTY;  Surgeon: Cammy Copa, MD;  Location: [facility] OR;  Service: Orthopedics;  Laterality: Left;  Left Total Knee Arthroplasty   . Hardware removal Left 02/26/2013    Procedure: HARDWARE REMOVAL;  Surgeon: Cammy Copa, MD;  Location: [facility] OR;  Service: Orthopedics;  Laterality: Left;  Left Knee Removal of Hardware   No family  history on file. History  Substance Use Topics  . Smoking status: Former Smoker -- 0.50 packs/day for 16 years    Types: Cigarettes    Quit date: 10/04/1995  . Smokeless tobacco: Never Used  . Alcohol Use: No   OB History    No data available     Review of Systems  Constitutional: Negative for fever.  Eyes: Negative for photophobia and visual disturbance.  Respiratory: Negative for shortness of breath.   Cardiovascular: Negative for chest pain.  Gastrointestinal: Positive for nausea and vomiting.  Skin: Negative for rash.  Neurological: Positive for headaches. Negative for syncope, weakness and numbness.  All other systems reviewed and are negative.     Allergies  Review of patient's allergies indicates no known allergies.  Home Medications   Prior to Admission medications   Medication Sig Start Date End Date Taking? Authorizing Provider  VICTOZA 18 MG/3ML SOPN Inject 1.8 mg into the skin daily. 01/08/15  Yes Historical Provider, MD  doxycycline (VIBRA-TABS) 100 MG tablet Take 1 tablet (100 mg total) by mouth every 12 (twelve) hours. Patient not taking: Reported on 04/17/2015 03/01/13   Cammy Copa, MD  doxycycline (VIBRAMYCIN) 50 MG capsule Take 2 capsules (100 mg total) by mouth 2 (two) times daily. Patient not taking: Reported on 04/17/2015 03/01/13   Cammy Copa, MD  methocarbamol (ROBAXIN) 500 MG tablet Take 1 tablet (500 mg total) by mouth every 6 (six) hours as needed. Patient not taking: Reported on 04/17/2015 03/01/13   Cammy Copa,  MD  oxyCODONE (OXY IR/ROXICODONE) 5 MG immediate release tablet Take 1-2 tablets (5-10 mg total) by mouth every 3 (three) hours as needed. Patient not taking: Reported on 04/17/2015 03/01/13   Cammy CopaScott Gregory Dean, MD  OxyCODONE (OXYCONTIN) 10 mg T12A Take 1 tablet (10 mg total) by mouth every 12 (twelve) hours. Patient not taking: Reported on 04/17/2015 03/01/13   Cammy CopaScott Gregory Dean, MD  tiZANidine (ZANAFLEX) 4 MG capsule Take  1 capsule (4 mg total) by mouth 3 (three) times daily as needed (headaches). 04/17/15   Francee PiccoloJennifer Congetta Odriscoll, PA-C  warfarin (COUMADIN) 5 MG tablet Take 1 tablet (5 mg total) by mouth daily. Patient not taking: Reported on 04/17/2015 03/01/13   Cammy CopaScott Gregory Dean, MD   BP 130/62 mmHg  Pulse 95  Temp(Src) 97.7 F (36.5 C) (Oral)  Resp 14  SpO2 94% Physical Exam  Constitutional: She is oriented to person, place, and time. She appears well-developed and well-nourished. No distress.  HENT:  Head: Normocephalic and atraumatic.  Right Ear: External ear normal.  Left Ear: External ear normal.  Nose: Nose normal.  Mouth/Throat: Oropharynx is clear and moist. No oropharyngeal exudate.  Eyes: Conjunctivae and EOM are normal. Pupils are equal, round, and reactive to light.  Neck: Normal range of motion. Neck supple.  Cardiovascular: Normal rate, regular rhythm, normal heart sounds and intact distal pulses.   Pulmonary/Chest: Effort normal and breath sounds normal. No respiratory distress.  Abdominal: Soft. There is no tenderness.  Neurological: She is alert and oriented to person, place, and time. She has normal strength. No cranial nerve deficit. Gait normal. GCS eye subscore is 4. GCS verbal subscore is 5. GCS motor subscore is 6.  Sensation grossly intact.  No pronator drift.  Bilateral heel-knee-shin intact.  Skin: Skin is warm and dry. She is not diaphoretic.  Nursing note and vitals reviewed.   ED Course  Procedures (including critical care time) Medications  sodium chloride 0.9 % bolus 1,000 mL (0 mLs Intravenous Stopped 04/17/15 0643)  diphenhydrAMINE (BENADRYL) injection 25 mg (25 mg Intravenous Given 04/17/15 0506)  metoCLOPramide (REGLAN) injection 10 mg (10 mg Intravenous Given 04/17/15 0506)  prochlorperazine (COMPAZINE) injection 10 mg (10 mg Intravenous Given 04/17/15 0506)  haloperidol lactate (HALDOL) injection 5 mg (5 mg Intravenous Given 04/17/15 0612)    Labs Review Labs  Reviewed - No data to display  Imaging Review No results found.   EKG Interpretation None       MDM   Final diagnoses:  Acute nonintractable headache, unspecified headache type    Filed Vitals:   04/17/15 0647  BP: 130/62  Pulse: 95  Temp:   Resp: 14   Afebrile, NAD, non-toxic appearing, AAOx4.   Pt HA treated and improved while in ED.  Presentation is non concerning for [facility], ICH, Meningitis, or temporal arteritis. Pt is afebrile with no focal neuro deficits, nuchal rigidity, or change in vision. Pt is to follow up with PCP to discuss prophylactic medication. Pt verbalizes understanding and is agreeable with plan to dc.  Patient is stable at time of discharge     Francee PiccoloJennifer Madelena Maturin, PA-C 04/17/15 [id number]  Devoria AlbeIva Knapp, MD 04/17/15 [phone]

## 2015-04-17 NOTE — Discharge Instructions (Signed)
Please follow up with your primary care physician in 1-2 days. If you do not have one [facility]. Please follow up with Neurology office to schedule a follow up appointment.  Please read all discharge instructions and return precautions.   Migraine Headache A migraine headache is an intense, throbbing pain on one or both sides of your head. A migraine can last for 30 minutes to several hours. CAUSES  The exact cause of a migraine headache is not always known. However, a migraine may be caused when nerves in the brain become irritated and release chemicals that cause inflammation. This causes pain. Certain things may also trigger migraines, such as:  Alcohol.  Smoking.  Stress.  Menstruation.  Aged cheeses.  Foods or drinks that contain nitrates, glutamate, aspartame, or tyramine.  Lack of sleep.  Chocolate.  Caffeine.  Hunger.  Physical exertion.  Fatigue.  Medicines used to treat chest pain (nitroglycerine), birth control pills, estrogen, and some blood pressure medicines. SIGNS AND SYMPTOMS  Pain on one or both sides of your head.  Pulsating or throbbing pain.  Severe pain that prevents daily activities.  Pain that is aggravated by any physical activity.  Nausea, vomiting, or both.  Dizziness.  Pain with exposure to bright lights, loud noises, or activity.  General sensitivity to bright lights, loud noises, or smells. Before you get a migraine, you may get warning signs that a migraine is coming (aura). An aura may include:  Seeing flashing lights.  Seeing bright spots, halos, or zigzag lines.  Having tunnel vision or blurred vision.  Having feelings of numbness or tingling.  Having trouble talking.  Having muscle weakness. DIAGNOSIS  A migraine headache is often diagnosed based on:  Symptoms.  Physical exam.  A CT scan or MRI of your head. These imaging tests cannot diagnose migraines,  but they can help rule out other causes of headaches. TREATMENT Medicines may be given for pain and nausea. Medicines can also be given to help prevent recurrent migraines.  HOME CARE INSTRUCTIONS  Only take over-the-counter or prescription medicines for pain or discomfort as directed by your health care provider. The use of long-term narcotics is not recommended.  Lie down in a dark, quiet room when you have a migraine.  Keep a journal to find out what may trigger your migraine headaches. For example, write down:  What you eat and drink.  How much sleep you get.  Any change to your diet or medicines.  Limit alcohol consumption.  Quit smoking if you smoke.  Get 7-9 hours of sleep, or as recommended by your health care provider.  Limit stress.  Keep lights dim if bright lights bother you and make your migraines worse. SEEK IMMEDIATE MEDICAL CARE IF:   Your migraine becomes severe.  You have a fever.  You have a stiff neck.  You have vision loss.  You have muscular weakness or loss of muscle control.  You start losing your balance or have trouble walking.  You feel faint or pass out.  You have severe symptoms that are different from your first symptoms. MAKE SURE YOU:   Understand these instructions.  Will watch your condition.  Will get help right away if you are not doing well or get worse. Document Released: 09/19/2005 Document Revised: 02/03/2014 Document Reviewed: 05/27/2013 [facility] Patient Information 2015 [place]. This information is not intended to replace advice given to you by your health care provider. Make sure  you discuss any questions you have with your health care provider.

## 2015-04-17 NOTE — ED Notes (Signed)
Pt rates pain in head 8/10. Pt keeps falling asleep while this RN is in exam room.

## 2015-11-19 ENCOUNTER — Other Ambulatory Visit: Payer: Self-pay | Admitting: Orthopedic Surgery

## 2015-11-19 DIAGNOSIS — M25511 Pain in right shoulder: Secondary | ICD-10-CM

## 2015-11-30 ENCOUNTER — Emergency Department (HOSPITAL_COMMUNITY)
Admission: EM | Admit: 2015-11-30 | Discharge: 2015-12-01 | Disposition: A | Discharge status: Other Acute Inpatient Hospital | Payer: 59 | Attending: Emergency Medicine | Admitting: Emergency Medicine

## 2015-11-30 ENCOUNTER — Encounter (HOSPITAL_COMMUNITY): Payer: Self-pay | Admitting: Emergency Medicine

## 2015-11-30 DIAGNOSIS — Z3202 Encounter for pregnancy test, result negative: Secondary | ICD-10-CM | POA: Insufficient documentation

## 2015-11-30 DIAGNOSIS — Z87891 Personal history of nicotine dependence: Secondary | ICD-10-CM | POA: Insufficient documentation

## 2015-11-30 DIAGNOSIS — Z8719 Personal history of other diseases of the digestive system: Secondary | ICD-10-CM | POA: Diagnosis not present

## 2015-11-30 DIAGNOSIS — Z8679 Personal history of other diseases of the circulatory system: Secondary | ICD-10-CM | POA: Diagnosis not present

## 2015-11-30 DIAGNOSIS — F329 Major depressive disorder, single episode, unspecified: Secondary | ICD-10-CM | POA: Diagnosis not present

## 2015-11-30 DIAGNOSIS — Z8742 Personal history of other diseases of the female genital tract: Secondary | ICD-10-CM | POA: Insufficient documentation

## 2015-11-30 DIAGNOSIS — M1990 Unspecified osteoarthritis, unspecified site: Secondary | ICD-10-CM | POA: Diagnosis not present

## 2015-11-30 DIAGNOSIS — Z79899 Other long term (current) drug therapy: Secondary | ICD-10-CM | POA: Insufficient documentation

## 2015-11-30 DIAGNOSIS — R45851 Suicidal ideations: Secondary | ICD-10-CM

## 2015-11-30 DIAGNOSIS — Z7901 Long term (current) use of anticoagulants: Secondary | ICD-10-CM | POA: Diagnosis not present

## 2015-11-30 DIAGNOSIS — F32A Depression, unspecified: Secondary | ICD-10-CM

## 2015-11-30 HISTORY — DX: Major depressive disorder, single episode, unspecified: F32.9

## 2015-11-30 HISTORY — DX: Depression, unspecified: F32.A

## 2015-11-30 LAB — COMPREHENSIVE METABOLIC PANEL
ALT: 53 U/L (ref 14–54)
AST: 45 U/L — AB (ref 15–41)
Albumin: 3.8 g/dL (ref 3.5–5.0)
Alkaline Phosphatase: 106 U/L (ref 38–126)
Anion gap: 12 (ref 5–15)
BUN: 15 mg/dL (ref 6–20)
CHLORIDE: 100 mmol/L — AB (ref 101–111)
CO2: 26 mmol/L (ref 22–32)
CREATININE: 0.83 mg/dL (ref 0.44–1.00)
Calcium: 9.5 mg/dL (ref 8.9–10.3)
GFR calc non Af Amer: >60 mL/min (ref >60)
GLUCOSE: 190 mg/dL — AB (ref 65–99)
Potassium: 4.1 mmol/L (ref 3.5–5.1)
SODIUM: 138 mmol/L (ref 135–145)
Total Bilirubin: 0.8 mg/dL (ref 0.3–1.2)
Total Protein: 7.2 g/dL (ref 6.5–8.1)

## 2015-11-30 LAB — CBC
HCT: 43.4 % (ref 36.0–46.0)
Hemoglobin: 15.2 g/dL — ABNORMAL HIGH (ref 12.0–15.0)
MCH: 33.3 pg (ref 26.0–34.0)
MCHC: 35 g/dL (ref 30.0–36.0)
MCV: 95.2 fL (ref 78.0–100.0)
Platelets: 310 10*3/uL (ref 150–400)
RBC: 4.56 MIL/uL (ref 3.87–5.11)
RDW: 13.7 % (ref 11.5–15.5)
WBC: 8 10*3/uL (ref 4.0–10.5)

## 2015-11-30 LAB — RAPID URINE DRUG SCREEN, HOSP PERFORMED
AMPHETAMINES: NOT DETECTED
Barbiturates: NOT DETECTED
Benzodiazepines: NOT DETECTED
Cocaine: NOT DETECTED
Opiates: POSITIVE — AB
TETRAHYDROCANNABINOL: NOT DETECTED

## 2015-11-30 LAB — I-STAT BETA HCG BLOOD, ED (MC, WL, AP ONLY): I-stat hCG, quantitative: <5 m[IU]/mL (ref <5)

## 2015-11-30 NOTE — ED Notes (Signed)
TTS in progress at this time.  

## 2015-11-30 NOTE — [facility] Counselor (Signed)
Change of counselor due to walk-in pre-empting this assessment for previous counselor. This Clinical research associate will proceed with assessment.   Beryle Flock, MS, CRC, [facility] Triage Specialist [facility]

## 2015-11-30 NOTE — ED Provider Notes (Signed)
CSN: [id number]     Arrival date & time 11/30/15  1722 History   First MD Initiated Contact with Patient 11/30/15 2213     Chief Complaint  Patient presents with  . Suicidal  . Depression     (Consider location/radiation/quality/duration/timing/severity/associated sxs/prior Treatment) HPI  The patient is a 50 year old female, she has a history of several medical problems including chronic depression and anxiety, migraine headaches, acid reflux and irritable bowel syndrome.  She has taken Coumadin in the past as well as Victoza. She presents today from her doctor's office where she was sent from because she was complaining of depression and now having some suicidal thoughts. 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 She thought about taking the dog grooming leash, attention 1 into the door and hanging herself on the other end. She denies trying this, she has not tried to kill herself, she is here because she wants help, she has never been in this situation in the past.  Past Medical History  Diagnosis Date  . Anxiety   . IBS (irritable bowel syndrome)   . Menorrhagia   . GERD (gastroesophageal reflux disease)     with Mobic  . Arthritis     "knees, back, hands" (02/26/2013)  . Migraine headache   . Depression    Past Surgical History  Procedure Laterality Date  . Tubal ligation  1988  . Replacement total knee Left 02/26/2013    "w/hardware removal" (02/26/2013)  . Knee arthroplasty Left ?2001    "put hardware in" (02/26/2013)  . Knee arthroscopy Left     "I've had 2; 2nd one was 2013" (02/26/2013)  . Joint replacement    . Total knee arthroplasty Left 02/26/2013    Procedure: TOTAL KNEE ARTHROPLASTY;  Surgeon: Cammy Copa, MD;  Location: [facility] OR;  Service: Orthopedics;  Laterality: Left;  Left Total Knee Arthroplasty    . Hardware removal Left 02/26/2013    Procedure: HARDWARE REMOVAL;  Surgeon: Cammy Copa, MD;  Location: [facility] OR;  Service: Orthopedics;  Laterality: Left;  Left Knee Removal of Hardware   History reviewed. No pertinent family history. Social History  Substance Use Topics  . Smoking status: Former Smoker -- 0.50 packs/day for 16 years    Types: Cigarettes    Quit date: 10/04/1995  . Smokeless tobacco: Never Used  . Alcohol Use: No   OB History    No data available     Review of Systems  All other systems reviewed and are negative.     Allergies  Review of patient's allergies indicates no known allergies.  Home Medications   Prior to Admission medications   Medication Sig Start Date End Date Taking? Authorizing Provider  doxycycline (VIBRA-TABS) 100 MG tablet Take 1 tablet (100 mg total) by mouth every 12 (twelve) hours. Patient not taking: Reported on 04/17/2015 03/01/13   Cammy Copa, MD  doxycycline (VIBRAMYCIN) 50 MG capsule Take 2 capsules (100 mg total) by mouth 2 (two) times daily. Patient not taking: Reported on 04/17/2015 03/01/13   Cammy Copa, MD  methocarbamol (ROBAXIN) 500 MG tablet Take 1 tablet (500 mg total) by mouth every 6 (six) hours as needed. Patient not taking: Reported on 04/17/2015 03/01/13   Cammy Copa, MD  oxyCODONE (OXY IR/ROXICODONE) 5 MG immediate release tablet Take 1-2  tablets (5-10 mg total) by mouth every 3 (three) hours as needed. Patient not taking: Reported on 04/17/2015 03/01/13   Cammy Copa, MD  OxyCODONE (OXYCONTIN) 10 mg T12A Take 1 tablet (10 mg total) by mouth every 12 (twelve) hours. Patient not taking: Reported on 04/17/2015 03/01/13   Cammy Copa, MD  tiZANidine (ZANAFLEX) 4 MG capsule Take 1 capsule (4 mg total) by mouth 3 (three) times daily as needed (headaches). 04/17/15   Jennifer Piepenbrink, PA-C  VICTOZA 18 MG/3ML SOPN Inject 1.8 mg into the skin daily. 01/08/15   Historical Provider, MD   warfarin (COUMADIN) 5 MG tablet Take 1 tablet (5 mg total) by mouth daily. Patient not taking: Reported on 04/17/2015 03/01/13   Cammy Copa, MD   BP 153/99 mmHg  Pulse 116  Temp(Src) 99.1 F (37.3 C) (Oral)  Resp 20  SpO2 97% Physical Exam  Constitutional: She appears well-developed and well-nourished. No distress.  HENT:  Head: Normocephalic and atraumatic.  Mouth/Throat: Oropharynx is clear and moist. No oropharyngeal exudate.  Eyes: Conjunctivae and EOM are normal. Pupils are equal, round, and reactive to light. Right eye exhibits no discharge. Left eye exhibits no discharge. No scleral icterus.  Neck: Normal range of motion. Neck supple. No JVD present. No thyromegaly present.  Cardiovascular: Normal rate, regular rhythm, normal heart sounds and intact distal pulses.  Exam reveals no gallop and no friction rub.   No murmur heard. Pulmonary/Chest: Effort normal and breath sounds normal. No respiratory distress. She has no wheezes. She has no rales.  Abdominal: Soft. Bowel sounds are normal. She exhibits no distension and no mass. There is no tenderness.  Musculoskeletal: Normal range of motion. She exhibits no edema or tenderness.  Lymphadenopathy:    She has no cervical adenopathy.  Neurological: She is alert. Coordination normal.  Skin: Skin is warm and dry. No rash noted. No erythema.  Psychiatric:  Depressed, sad affect, suicidal ideations present, no hallucinations, no internal stimuli  Nursing note and vitals reviewed.   ED Course  Procedures (including critical care time) Labs Review Labs Reviewed  COMPREHENSIVE METABOLIC PANEL - Abnormal; Notable for the following:    Chloride 100 (*)    Glucose, Bld 190 (*)    AST 45 (*)    All other components within normal limits  CBC - Abnormal; Notable for the following:    Hemoglobin 15.2 (*)    All other components within normal limits  URINE RAPID DRUG SCREEN, HOSP PERFORMED - Abnormal; Notable for the following:     Opiates POSITIVE (*)    All other components within normal limits  ETHANOL  SALICYLATE LEVEL  ACETAMINOPHEN LEVEL  I-STAT BETA HCG BLOOD, ED ([facility], [facility], [facility] ONLY)    Imaging Review No results found. I have personally reviewed and evaluated these images and lab results as part of my medical decision-making.   EKG Interpretation None      MDM   Final diagnoses:  None    The patient will definitely need psychiatric consultation for severe worsening depression and now having suicidal thoughts.    Eber Hong, MD 12/01/15 [phone]

## 2015-11-30 NOTE — ED Notes (Signed)
Pt sts increased depression recently with SI without plan

## 2015-11-30 NOTE — BH Assessment (Signed)
Writer contacted pt RN Kendal Hymen) and requested cart placement for TTS Consult.

## 2015-12-01 ENCOUNTER — Encounter (HOSPITAL_COMMUNITY): Payer: Self-pay | Admitting: *Deleted

## 2015-12-01 ENCOUNTER — Inpatient Hospital Stay (HOSPITAL_COMMUNITY)
Admission: AD | Admit: 2015-12-01 | Discharge: 2015-12-04 | DRG: 885 | Disposition: A | Discharge status: Home / Self Care | Payer: 59 | Source: Intra-hospital | Attending: Psychiatry | Admitting: Psychiatry

## 2015-12-01 DIAGNOSIS — Z87891 Personal history of nicotine dependence: Secondary | ICD-10-CM | POA: Diagnosis not present

## 2015-12-01 DIAGNOSIS — F339 Major depressive disorder, recurrent, unspecified: Principal | ICD-10-CM | POA: Diagnosis present

## 2015-12-01 DIAGNOSIS — R45851 Suicidal ideations: Secondary | ICD-10-CM | POA: Diagnosis present

## 2015-12-01 LAB — SALICYLATE LEVEL: Salicylate Lvl: <4 mg/dL (ref 2.8–30.0)

## 2015-12-01 LAB — ACETAMINOPHEN LEVEL

## 2015-12-01 MED ORDER — MAGNESIUM HYDROXIDE 400 MG/5ML PO SUSP: 30.0000 mL | Freq: Every day | ORAL | Status: DC | PRN

## 2015-12-01 MED ORDER — HYDROXYZINE HCL 25 MG PO TABS
25.0000 mg | ORAL_TABLET | Freq: Four times a day (QID) | ORAL | Status: DC | PRN
  Administered 2015-12-01: 25 mg via ORAL
  Filled 2015-12-01 (×2): qty 1

## 2015-12-01 MED ORDER — IBUPROFEN 600 MG PO TABS
600.0000 mg | ORAL_TABLET | Freq: Four times a day (QID) | ORAL | Status: DC | PRN
  Administered 2015-12-01 – 2015-12-04 (×3): 600 mg via ORAL
  Filled 2015-12-01 (×3): qty 1

## 2015-12-01 MED ORDER — ALUM & MAG HYDROXIDE-SIMETH 200-200-20 MG/5ML PO SUSP
30.0000 mL | ORAL | Status: DC | PRN
  Administered 2015-12-02 – 2015-12-03 (×2): 30 mL via ORAL
  Filled 2015-12-01 (×2): qty 30

## 2015-12-01 MED ORDER — ACETAMINOPHEN 325 MG PO TABS
650.0000 mg | ORAL_TABLET | Freq: Four times a day (QID) | ORAL | Status: DC | PRN
  Administered 2015-12-01: 650 mg via ORAL
  Filled 2015-12-01: qty 2

## 2015-12-01 MED ORDER — TRAZODONE HCL 50 MG PO TABS
50.0000 mg | ORAL_TABLET | Freq: Every evening | ORAL | Status: DC | PRN
  Filled 2015-12-01: qty 1
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
## 2015-12-01 NOTE — ED Notes (Addendum)
Patient requested call to husband, Minerva Areola [phone]). Husband is now home. Provided phone number to call in the morning to check on patient.

## 2015-12-01 NOTE — Progress Notes (Signed)
Pt accepted to [facility] bed 406-1 by Melvenia Beam, NP, attending will be Dr Jama Flavors. Report can be called at [phone]. Admission is voluntary.  Ilean Skill, MSW, LCSW Clinical Social Work, Disposition  12/01/2015 [phone]

## 2015-12-01 NOTE — Progress Notes (Signed)
D:Patient in her room in bed on approach.  Patient appears sad and depressed.  Patient states, "I am ready to go home."  Patient states she came to get medications adjusted.  Patient states, "I am sad being in here."  Patient states she misses her family.  Patient states her husband is supportive.  Patient rates anxiety 1/10 and depression 1/10.  Patient denies SI/HI and denies AVH. A: Staff to monitor Q 15 mins for safety.  Encouragement and support offered.  No scheduled medications administered per orders.    R: Patient remains safe on the unit.  Patient did not attend group tonight.  Patient not visible on the unit.  Patient taking administered medications.

## 2015-12-01 NOTE — ED Notes (Signed)
Pt ambulatory to Pod C w/sitter. 2 labeled belongings bags placed at nurses' desk for inventory.

## 2015-12-01 NOTE — ED Notes (Signed)
Patient was given a snack and drink. A regular diet order taken for lunch. 

## 2015-12-01 NOTE — ED Notes (Signed)
Pt called [facility] and left message to cancel appt this pm.

## 2015-12-01 NOTE — Tx Team (Signed)
Initial Interdisciplinary Treatment Plan   PATIENT STRESSORS: Financial difficulties Health problems Loss of father  Occupational concerns   PATIENT STRENGTHS: Ability for insight Average or above average intelligence Capable of independent living Communication skills General fund of knowledge Motivation for treatment/growth Physical Health Supportive family/friends Work skills   PROBLEM LIST: Problem List/Patient Goals Date to be addressed Date deferred Reason deferred Estimated date of resolution  "depression" 12/01/2015   D/c  "anxiety" 12/01/2015   D/c  "panic attack" 12/01/2015   D/c  "suicidal thoughts" 12/01/2015   D/c                                 DISCHARGE CRITERIA:  Ability to meet basic life and health needs Adequate post-discharge living arrangements Improved stabilization in mood, thinking, and/or behavior Medical problems require only outpatient monitoring Motivation to continue treatment in a less acute level of care Need for constant or close observation no longer present Reduction of life-threatening or endangering symptoms to within safe limits Safe-care adequate arrangements made Verbal commitment to aftercare and medication compliance  PRELIMINARY DISCHARGE PLAN: Attend aftercare/continuing care group Attend PHP/IOP Outpatient therapy Return to previous living arrangement Return to previous work or school arrangements  PATIENT/FAMIILY INVOLVEMENT: This treatment plan has been presented to and reviewed with the patient, Jaylin Roundy Amerman.  The patient and family have been given the opportunity to ask questions and make suggestions.  Earline Mayotte 12/01/2015, 4:25 PM

## 2015-12-01 NOTE — Progress Notes (Signed)
Patient stated she takes vybrid 40 mg daily, abilify 2 mg daily, imitrex for migraines, phenegran, naprosyn 100 mg bid for arthritis.  [facility] [employer].

## 2015-12-01 NOTE — ED Notes (Signed)
Patient was moved too a big bed.

## 2015-12-01 NOTE — ED Notes (Signed)
Pt signed consent forms - faxed copy to [facility], copy sent to med records and original to [facility].
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
## 2015-12-02 ENCOUNTER — Encounter (HOSPITAL_COMMUNITY): Payer: Self-pay | Admitting: Psychiatry

## 2015-12-02 DIAGNOSIS — F339 Major depressive disorder, recurrent, unspecified: Principal | ICD-10-CM

## 2015-12-02 MED ORDER — ARIPIPRAZOLE 2 MG PO TABS
2.0000 mg | ORAL_TABLET | Freq: Every day | ORAL | Status: DC
  Administered 2015-12-02 – 2015-12-04 (×3): 2 mg via ORAL
  Filled 2015-12-02 (×5): qty 1

## 2015-12-02 MED ORDER — DULOXETINE HCL 30 MG PO CPEP
30.0000 mg | ORAL_CAPSULE | Freq: Every day | ORAL | Status: DC
  Administered 2015-12-02 – 2015-12-03 (×2): 30 mg via ORAL
  Filled 2015-12-02 (×5): qty 1

## 2015-12-02 MED ORDER — SIMETHICONE 80 MG PO CHEW
80.0000 mg | CHEWABLE_TABLET | Freq: Four times a day (QID) | ORAL | Status: DC | PRN
  Administered 2015-12-02: 80 mg via ORAL

## 2015-12-02 NOTE — Progress Notes (Signed)
Recreation Therapy Notes  Date: 03.01.2017 Time: 9:30am Location: [address] Group Room   Group Topic: Stress Management  Goal Area(s) Addresses:  Patient will actively participate in stress management techniques presented during session.   Behavioral Response: Appropriate   Intervention: Stress management techniques  Activity :  Diaphragmatic Breathing and Mindfulness. LRT provided education, instruction and demonstration on practice of Diaphragmatic Breathing and Mindfulness. Patient was asked to participate in technique introduced during session.   Education:  Stress Management, Discharge Planning.   Education Outcome: Acknowledges education/In group clarification offered/Needs additional education  Clinical Observations/Feedback: Patient actively engaged in technique introduced, expressed no concerns and demonstrated ability to practice independently post d/c.   Marykay Lex Sheddrick Lattanzio, LRT/CTRS        Palmer Fahrner L 12/02/2015 11:53 AM

## 2015-12-02 NOTE — H&P (Signed)
Psychiatric Admission Assessment Adult  Patient Identification: Maria Hancock MRN:  [id number] Date of Evaluation:  12/03/2015 Chief Complaint:  UNSPECIFIED DEPRESSIVE DISORDER UNSPECIFIED ANXIETY DISORDER Principal Diagnosis: Major depression, recurrent, chronic ([place]) Diagnosis:   Patient Active Problem List   Diagnosis Date Noted  . Major depression, recurrent, chronic ([place]) [F33.9] 12/02/2015    Priority: High  . Osteoarthritis of left knee [M17.9] 02/26/2013    Class: Diagnosis of   History of Present Illness:  Haskell Flirt Dorsey, 50 y.o.female initially presented to [facility] after a PCP visit wherein patient told her their MD that she was having suicidal ideations.  Patient denied HI, SHI and AVH.  Patient stated that she has been depressed in the last month due to declining health and problems with her employer regarding her declining health and work issues.  She then developed passive suicidal ideations.  She reports history of depression, anxiety, mainly excessive worrying, denies history of mania, denies history of psychosis, denies prior suicide attempts.  Patient had been recently had been on Abilify and Viibryd, however she states she had not been on the medication for a long time.  Associated Signs/Symptoms: Depression Symptoms:  depressed mood, (Hypo) Manic Symptoms:  Labiality of Mood, Anxiety Symptoms:  Excessive Worry, Psychotic Symptoms:  NA PTSD Symptoms: NA Total Time spent with patient: 45 minutes  Past Psychiatric History: see above noted  Is the patient at risk to self? Yes.    Has the patient been a risk to self in the past 6 months? Yes.    Has the patient been a risk to self within the distant past? Yes.    Is the patient a risk to others? No.  Has the patient been a risk to others in the past 6 months? No.  Has the patient been a risk to others within the distant past? No.   Prior Inpatient Therapy:   Prior Outpatient Therapy:    Alcohol Screening: 1.  How often do you have a drink containing alcohol?: Never 9. Have you or someone else been injured as a result of your drinking?: No 10. Has a relative or friend or a doctor or another health worker been concerned about your drinking or suggested you cut down?: No Alcohol Use Disorder Identification Test Final Score (AUDIT): 0 Brief Intervention: AUDIT score less than 7 or less-screening does not suggest unhealthy drinking-brief intervention not indicated Substance Abuse History in the last 12 months:  Yes.   Consequences of Substance Abuse: NA Previous Psychotropic Medications: Yes  Psychological Evaluations: Yes  Past Medical History:  Past Medical History  Diagnosis Date  . Anxiety   . IBS (irritable bowel syndrome)   . Menorrhagia   . GERD (gastroesophageal reflux disease)     with Mobic  . Arthritis     "knees, back, hands" (02/26/2013)  . Migraine headache   . Depression     Past Surgical History  Procedure Laterality Date  . Tubal ligation  1988  . Replacement total knee Left 02/26/2013    "w/hardware removal" (02/26/2013)  . Knee arthroplasty Left ?2001    "put hardware in" (02/26/2013)  . Knee arthroscopy Left     "I've had 2; 2nd one was 2013" (02/26/2013)  . Joint replacement    . Total knee arthroplasty Left 02/26/2013    Procedure: TOTAL KNEE ARTHROPLASTY;  Surgeon: Meredith Pel, MD;  Location: [place];  Service: Orthopedics;  Laterality: Left;  Left Total Knee Arthroplasty   . Hardware removal Left 02/26/2013  Procedure: HARDWARE REMOVAL;  Surgeon: Meredith Pel, MD;  Location: [place];  Service: Orthopedics;  Laterality: Left;  Left Knee Removal of Hardware   Family History: History reviewed. No pertinent family history. Family Psychiatric  History:  Denied Tobacco Screening:  Social History:  History  Alcohol Use No    Comment: denied all alcohol use     History  Drug Use No    Comment: denied all drug/alcohol/tobacco use    Additional Social  History: Marital status: Married Number of Years Married: 5 What types of issues is patient dealing with in the relationship?: Husband is supportive Does patient have children?: Yes How many children?: 3 How is patient's relationship with their children?: 20 year old disabiled son lives at home, good relationship with him and her other 2 adult children    Pain Medications: See med record Over the Counter: see med record History of alcohol / drug use?: No history of alcohol / drug abuse Longest period of sobriety (when/how long): denied all drug/alcohol use Negative Consequences of Use: Personal relationships, Work / Youth worker Withdrawal Symptoms: Other (Comment) (denied all withdrawals) Name of Substance 1: tramadol prescribed, not abused 1 - Age of First Use: does not remember 1 - Frequency: daily      Allergies:  No Known Allergies Lab Results:  No results found for this or any previous visit (from the past 73 hour(s)).  Blood Alcohol level:  No results found for: [facility]  Metabolic Disorder Labs:  No results found for: HGBA1C, MPG No results found for: PROLACTIN No results found for: CHOL, TRIG, HDL, CHOLHDL, VLDL, LDLCALC  Current Medications: Current Facility-Administered Medications  Medication Dose Route Frequency Provider Last Rate Last Dose  . acetaminophen (TYLENOL) tablet 650 mg  650 mg Oral Q6H PRN Kerrie Buffalo, NP   650 mg at 12/01/15 1632  . alum & mag hydroxide-simeth (MAALOX/MYLANTA) 200-200-20 MG/5ML suspension 30 mL  30 mL Oral Q4H PRN Kerrie Buffalo, NP   30 mL at 12/02/15 1938  . ARIPiprazole (ABILIFY) tablet 2 mg  2 mg Oral Daily Jenne Campus, MD   2 mg at 12/03/15 0748  . DULoxetine (CYMBALTA) DR capsule 30 mg  30 mg Oral Daily Jenne Campus, MD   30 mg at 12/03/15 0748  . hydrOXYzine (ATARAX/VISTARIL) tablet 25 mg  25 mg Oral Q6H PRN Nicholaus Bloom, MD   25 mg at 12/01/15 1840  . ibuprofen (ADVIL,MOTRIN) tablet 600 mg  600 mg Oral Q6H PRN Nicholaus Bloom, MD   600 mg at 12/02/15 0348  . magnesium hydroxide (MILK OF MAGNESIA) suspension 30 mL  30 mL Oral Daily PRN Kerrie Buffalo, NP      . simethicone [facility]) chewable tablet 80 mg  80 mg Oral Q6H PRN Kerrie Buffalo, NP   80 mg at 12/02/15 1619  . traZODone (DESYREL) tablet 50 mg  50 mg Oral QHS PRN Kerrie Buffalo, NP       PTA Medications: Prescriptions prior to admission  Medication Sig Dispense Refill Last Dose  . ARIPiprazole (ABILIFY) 2 MG tablet Take 2 mg by mouth daily with supper.   Past Week at Unknown time  . cyclobenzaprine (FLEXERIL) 10 MG tablet Take 10 mg by mouth 2 (two) times daily as needed for muscle spasms.   Past Week at Unknown time  . naproxen (NAPROSYN) 500 MG tablet Take 500 mg by mouth 2 (two) times daily with a meal.   Past Week at Unknown time  .  Vilazodone HCl (VIIBRYD) 40 MG TABS Take 40 mg by mouth daily with supper.   Past Week at Unknown time   Musculoskeletal: Strength & Muscle Tone: within normal limits Gait & Station: normal Patient leans: N/A  Psychiatric Specialty Exam: Physical Exam  Vitals reviewed.   Review of Systems  Psychiatric/Behavioral: Positive for depression. The patient is nervous/anxious.     Blood pressure 159/103, pulse 102, temperature 98.2 F (36.8 C), temperature source Oral, resp. rate 18, height 5' 5"  (1.651 m), weight 140.161 kg (309 lb), SpO2 97 %.Body mass index is 51.42 kg/(m^2).   General Appearance: Fairly Groomed  Engineer, water:: Good  Speech: Normal Rate  Volume: Normal  Mood: less depressed  Affect: reactive, smiles at times appropriately  Thought Process: Linear  Orientation: Full (Time, Place, and Person)  Thought Content: denies AVH, ruminative about stressors as above   Suicidal Thoughts: Not at this time.  denies any suicidal ideations or any self contracts for safety on unit   Homicidal Thoughts: No  Memory: recent and remote grossly intact   Judgement: Fair  Insight: Fair   Psychomotor Activity: Normal  Concentration: Good  Recall: Good  Fund of Knowledge:Good  Language: Good  Akathisia: Negative  Handed: Right  AIMS (if indicated):    Assets: Desire for Improvement Resilience Social Support  Sleep: Number of Hours: 6  Cognition: WNL  ADL's: Intact       Treatment Plan Summary: Admit for crisis management and mood stabilization. Medication management to re-stabilize current mood symptoms.  Abilify 2 mgrs QDAY , agrees to start Cymbalta 30 mgrs QDAY initially, which may help address depression, anxiety, and pain.  Group counseling sessions for coping skills Medical consults as needed Review and reinstate any pertinent home medications for other health problems   Observation Level/Precautions:  15 minute checks  Laboratory:  per ED  Psychotherapy:  Group milieu  Medications:  As per medlist  Consultations:  As needed  Discharge Concerns:  safety  Estimated LOS:  2-7 days  Other:     I certify that inpatient services furnished can reasonably be expected to improve the patient's condition.    Janett Labella, NP  [facility]  3/2/20179:24 AM I have reviewed case with NP and have met with patient Agree with NP  Note and assessment  50 year old married female, lives with husband and adult son.  Reports worsening depression , with some neuro-vegetative symptoms such as decreased energy level , mild anhedonia. Developed recent suicidal ideations, mainly passive thoughts of death, dying, but also fleeting thoughts of choking self- states she went to see her PCP, who recommended she come to ED . Attributes depression partially to being called by her employment's HR department expressing concern that due to restriction she has to lift more than 20 lbs, they might not be able to continue her employment . She reports history of depression, anxiety, mainly excessive worrying, denies history of mania, denies history of psychosis, denies  prior suicide attempts  Had been on Abilify and Viibryd Recently , but had not been taking for long. Denies alcohol or drug abuse . Describes history of chronic pain , mainly osteoarthritis and chronic knee pain. Dx- Major Depression, Recurrent , no Psychotic Features Plan- inpatient admission, Abilify 2 mgrs QDAY , agrees to start Cymbalta 30 mgrs QDAY initially, which may help address depression, anxiety, and pain. Side effects discussed .

## 2015-12-02 NOTE — Progress Notes (Signed)
D: Patient in the hallway on approach.  Patient states, "I had a good day."  Patient states she hopes to be discharged by Friday.  Patient is worried about missing her MRI appointment that she has had it scheduled for weeks.  Patient states her family visited today and states it was a good visit.  Patient denies denies SI/HI and denies AVH. A: Staff to monitor Q 15 mins for safety.  Encouragement and support offered. No scheduled medications administered per orders. R: Patient remains safe on the unit.  Patient did not attend group tonight.  Patient visible on the unit tonight.  Patient taking administered medications.

## 2015-12-02 NOTE — [facility] Group Notes (Signed)
[facility] LCSW Aftercare Discharge Planning Group Note  12/02/2015  8:45 AM  Participation Quality: Did Not Attend. Patient invited to participate but declined.  Roslind Michaux, MSW, LCSW Clinical Social Worker [facility] [phone]   

## 2015-12-02 NOTE — Tx Team (Addendum)
Interdisciplinary Treatment Plan Update (Adult) Date: 12/03/15    Time Reviewed: 9:30 AM  Progress in Treatment: Attending groups: Yes Participating in groups: Yes Taking medication as prescribed: Yes Tolerating medication: Yes Family/Significant other contact made: Yes, CSW has spoken with patient's husband Patient understands diagnosis: Yes Discussing patient identified problems/goals with staff: Yes Medical problems stabilized or resolved: Yes Denies suicidal/homicidal ideation: Yes Issues/concerns per patient self-inventory: Yes Other:  New problem(s) identified: N/A  Discharge Plan or Barriers: Patient will return home with outpatient services.   Reason for Continuation of Hospitalization:  Depression Anxiety Medication Stabilization   Comments: N/A  Estimated length of stay: 1-2 days    Patient is a 50 year old female with a diagnosis of Major Depressive Disorder. Pt presented to the hospital with suicidal ideations and increasing depression. Pt reports primary trigger(s) for admission were medical issues and anxiety related to possible employment loss. Patient will benefit from crisis stabilization, medication evaluation, group therapy and psycho education in addition to case management for discharge planning. At discharge, it is recommended that Pt remain compliant with established discharge plan and continued treatment.   Review of initial/current patient goals per problem list:  1. Goal(s): Patient will participate in aftercare plan   Met: Yes   Target date: 3-5 days post admission date   As evidenced by: Patient will participate within aftercare plan AEB aftercare provider and housing plan at discharge being identified.  3/2: Goal met. Patient plans to return home to follow up with outpatient services.   2. Goal (s): Patient will exhibit decreased depressive symptoms and suicidal ideations.   Met: Yes   Target date: 3-5 days post admission date    As evidenced by: Patient will utilize self rating of depression at 3 or below and demonstrate decreased signs of depression or be deemed stable for discharge by MD.   3/2: Goal progressing.   3/3: Goal met. Patient denies depression or SI today. She reports feeling safe to return home at this time.   3. Goal(s): Patient will demonstrate decreased signs and symptoms of anxiety.   Met: Adequate for discharge per MD   Target date: 3-5 days post admission date   As evidenced by: Patient will utilize self rating of anxiety at 3 or below and demonstrated decreased signs of anxiety, or be deemed stable for discharge by MD   3/2: Goal progressing.   3/3: Adequate for discharge. Patient reports mild anxiety related to wanting to return home today.    Attendees:  Patient:    Family:    Physician: Dr. Parke Poisson, MD  12/03/2015 9:30 AM  Nursing: Loletta Specter, Saunders Glance., RN 12/03/2015 9:30 AM  Clinical Social Worker: Tilden Fossa, LCSW 12/03/2015 9:30 AM  Other:  12/03/2015 9:30 AM  Clinical: Agustina Caroli, May Augustin, NP 12/03/2015 9:30 AM  Other: Lars Pinks, RN Case manager  12/03/2015 9:30 AM                   Scribe for Treatment Team:  Tilden Fossa, [place] [phone]

## 2015-12-02 NOTE — Progress Notes (Signed)
Pt did not attend wrap-up group   
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
## 2015-12-02 NOTE — Plan of Care (Signed)
Problem: Alteration in mood Goal: STG-Patient reports thoughts of self-harm to staff Outcome: Progressing Pt. Denies SI

## 2015-12-02 NOTE — [facility] Group Notes (Signed)
[facility] LCSW Group Therapy 12/02/2015  1:15 pm   Type of Therapy: Group Therapy Participation Level: Active  Participation Quality: Attentive, Sharing and Supportive  Affect: Depressed and Flat  Cognitive: Alert and Oriented  Insight: Developing/Improving and Engaged  Engagement in Therapy: Developing/Improving and Engaged  Modes of Intervention: Clarification, Confrontation, Discussion, Education, Exploration, Limit-setting, Orientation, Problem-solving, Rapport Building, Dance movement psychotherapist, Socialization and Support  Summary of Progress/Problems: The topic for group was balance in life. Today's group focused on defining balance in one's own words, identifying things that can knock one off balance, and exploring healthy ways to maintain balance in life. Group members were asked to provide an example of a time when they felt off balance, describe how they handled that situation,and process healthier ways to regain balance in the future. Group members were asked to share the most important tool for maintaining balance that they learned while at [facility] and how they plan to apply this method after discharge. Patient identified medical issues as her trigger for being out of balance. CSW and other group members provided patient with emotional support and encouragement.   Samuella Bruin, MSW, LCSW Clinical Social Worker [facility] [phone]

## 2015-12-02 NOTE — Progress Notes (Addendum)
Patient ID: Maria Hancock, female   DOB: 08-24-1966, 50 y.o.   MRN: [id number]  DAR: Pt. Denies SI/HI and A/V Hallucinations.  She reports sleep is fair, appetite is good, and energy level is low. She rates depression 2/10, hopelessness 0/10, and anxiety 1/10. Patient does report pain in right knee and right shoulder, she was given ice packs to help relieve pain. Support and encouragement provided to the patient. Scheduled medications administered to patient per physician's orders. Patient is receptive and cooperative however is minimal and remains to herself in the milieu. She reported this morning, "I am ready to go home." However, when writer inquired about her medications she stated, "I think they weren't working from all the stressors at home." Patient's insight seems to be limited at this time due to worrying about getting back to work. She reported some gas and received PRN Simethicone to decrease this. She reports her goal is to go home. Q15 minute checks are maintained for safety.

## 2015-12-03 MED ORDER — DULOXETINE HCL 20 MG PO CPEP
40.0000 mg | ORAL_CAPSULE | Freq: Every day | ORAL | Status: DC
  Administered 2015-12-04: 40 mg via ORAL
  Filled 2015-12-03 (×3): qty 2

## 2015-12-03 NOTE — Progress Notes (Signed)
Adult Psychoeducational Group Note  Date:  12/03/2015 Time:  8:15 PM  Group Topic/Focus:  Wrap-Up Group:   The focus of this group is to help patients review their daily goal of treatment and discuss progress on daily workbooks.  Participation Level:  Did Not Attend  Pt did not attend wrap-up group.    Maria Hancock 12/03/2015, 8:44 PM
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
## 2015-12-03 NOTE — BHH Group Notes (Signed)
BHH Group Notes:  (Nursing/MHT/Case Management/Adjunct)  Date:  12/03/2015  Time:  9:21 AM  Type of Therapy:  Nurse Education  Participation Level:  Active  Participation Quality:  Appropriate  Affect:  Appropriate  Cognitive:  Appropriate  Insight:  Appropriate  Engagement in Group:  Engaged  Modes of Intervention:  Discussion, Education and Support  Summary of Progress/Problems: Maria Hancock said her goals include managing her emotions, take her meds, and try to socialize more.   Maurine Simmering 12/03/2015, 9:21 AM

## 2015-12-03 NOTE — Progress Notes (Signed)
D: Pt denies SI/HI/AVH but complains of some pain r/t torn meniscus. She declines an offer of a PRN for this, hoping instead that scheduled Cymbalta will help. She says she is looking forward to possible discharge tomorrow. On her self inventory, she rated depression, hopelessness, and anxiety all zero. A: Meds given as ordered. Q15 safety checks maintained. Support/encouragement offered. R: Pt remains free from harm and continues with treatment. Will continue to monitor for needs/safety.

## 2015-12-03 NOTE — Progress Notes (Addendum)
[facility] MD Progress Note  12/03/2015 3:23 PM Maria Hancock  MRN:  [id number] Subjective:  Patient reports that she is feeling better, less depressed, and is currently feeling " all right". Her focus is to be discharged soon, in particular, states she does not want to miss an appointment for ortho MRI that is scheduled for tomorrow afternoon and that would be hard to reschedule. States that her family came to visit yesterday, which also helped her feel better- describes husband as very supportive.  Objective : I have discussed case with treatment team and have met with patient. Patient presents with improved mood and range of affect, at this time smiles often and appropriately. Denies medication side effects- thus far tolerating Cymbalta well . No disruptive or agitated behaviors on unit, going to groups. 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   Principal Problem: Major depression, recurrent, chronic (HCC) Diagnosis:   Patient Active Problem List   Diagnosis Date Noted  . Major depression, recurrent, chronic ([place]) [F33.9] 12/02/2015  . Osteoarthritis of left knee [M17.9] 02/26/2013    Class: Diagnosis of   Total Time spent with patient: 25 minutes     Past Medical History:  Past Medical History  Diagnosis Date  . Anxiety   . IBS (irritable bowel syndrome)   . Menorrhagia   . GERD (gastroesophageal reflux disease)     with Mobic  . Arthritis     "knees, back, hands" (02/26/2013)  . Migraine headache   . Depression     Past Surgical History  Procedure Laterality Date  . Tubal ligation  1988  . Replacement total knee Left 02/26/2013    "w/hardware removal" (02/26/2013)  . Knee arthroplasty Left ?2001    "put hardware in" (02/26/2013)  . Knee arthroscopy Left     "I've had 2; 2nd one  was 2013" (02/26/2013)  . Joint replacement    . Total knee arthroplasty Left 02/26/2013    Procedure: TOTAL KNEE ARTHROPLASTY;  Surgeon: Meredith Pel, MD;  Location: [facility];  Service: Orthopedics;  Laterality: Left;  Left Total Knee Arthroplasty   . Hardware removal Left 02/26/2013    Procedure: HARDWARE REMOVAL;  Surgeon: Meredith Pel, MD;  Location: [facility];  Service: Orthopedics;  Laterality: Left;  Left Knee Removal of Hardware   Family History: History reviewed. No pertinent family history.  Social History:  History  Alcohol Use No    Comment: denied all alcohol use     History  Drug Use No    Comment: denied all drug/alcohol/tobacco use    Social History   Social History  . Marital Status: Married    Spouse Name: N/A  . Number of Children: N/A  . Years of Education: N/A   Social History Main Topics  . Smoking status: Former Smoker -- 0.50 packs/day for 16 years    Types: Cigarettes    Quit date: 10/04/1995  . Smokeless tobacco: Never Used  . Alcohol Use: No     Comment: denied all alcohol use  . Drug Use: No     Comment: denied all drug/alcohol/tobacco use  . Sexual Activity: Yes    Birth Control/ Protection: IUD     Comment: IUD in placed   Other Topics Concern  .  None   Social History Narrative   Additional Social History:    Pain Medications: See med record Over the Counter: see med record History of alcohol / drug use?: No history of alcohol / drug abuse Longest period of sobriety (when/how long): denied all drug/alcohol use Negative Consequences of Use: Personal relationships, Work / Youth worker Withdrawal Symptoms: Other (Comment) (denied all withdrawals) Name of Substance 1: tramadol prescribed, not abused 1 - Age of First Use: does not remember 1 - Frequency: daily  Sleep: Good  Appetite:  Good  Current Medications: Current Facility-Administered Medications  Medication Dose Route Frequency Provider Last Rate Last Dose  . acetaminophen  (TYLENOL) tablet 650 mg  650 mg Oral Q6H PRN Kerrie Buffalo, NP   650 mg at 12/01/15 1632  . alum & mag hydroxide-simeth (MAALOX/MYLANTA) 200-200-20 MG/5ML suspension 30 mL  30 mL Oral Q4H PRN Kerrie Buffalo, NP   30 mL at 12/02/15 1938  . ARIPiprazole (ABILIFY) tablet 2 mg  2 mg Oral Daily Jenne Campus, MD   2 mg at 12/03/15 0748  . DULoxetine (CYMBALTA) DR capsule 30 mg  30 mg Oral Daily Jenne Campus, MD   30 mg at 12/03/15 0748  . hydrOXYzine (ATARAX/VISTARIL) tablet 25 mg  25 mg Oral Q6H PRN Nicholaus Bloom, MD   25 mg at 12/01/15 1840  . ibuprofen (ADVIL,MOTRIN) tablet 600 mg  600 mg Oral Q6H PRN Nicholaus Bloom, MD   600 mg at 12/02/15 0348  . magnesium hydroxide (MILK OF MAGNESIA) suspension 30 mL  30 mL Oral Daily PRN Kerrie Buffalo, NP      . simethicone [facility]) chewable tablet 80 mg  80 mg Oral Q6H PRN Kerrie Buffalo, NP   80 mg at 12/02/15 1619  . traZODone (DESYREL) tablet 50 mg  50 mg Oral QHS PRN Kerrie Buffalo, NP        Lab Results: No results found for this or any previous visit (from the past 48 hour(s)).  Blood Alcohol level:  No results found for: [facility]  Physical Findings: AIMS: Facial and Oral Movements Muscles of Facial Expression: None, normal Lips and Perioral Area: None, normal Jaw: None, normal Tongue: None, normal,Extremity Movements Upper (arms, wrists, hands, fingers): None, normal Lower (legs, knees, ankles, toes): None, normal, Trunk Movements Neck, shoulders, hips: None, normal, Overall Severity Severity of abnormal movements (highest score from questions above): None, normal Incapacitation due to abnormal movements: None, normal Patient's awareness of abnormal movements (rate only patient's report): No Awareness, Dental Status Current problems with teeth and/or dentures?: No Does patient usually wear dentures?: No  CIWA:  CIWA-Ar Total: 2 COWS:  COWS Total Score: 4  Musculoskeletal: Strength & Muscle Tone: within normal limits Gait & Station:  normal Patient leans: N/A  Psychiatric Specialty Exam: ROS no nausea,no vomiting,  Denies chest pain, no SOB, knee pain  Blood pressure 159/103, pulse 102, temperature 98.2 F (36.8 C), temperature source Oral, resp. rate 18, height 5' 5"  (1.651 m), weight 309 lb (140.161 kg), SpO2 97 %.Body mass index is 51.42 kg/(m^2).  General Appearance: improved grooming   Eye Contact::  Good  Speech:  Normal Rate  Volume:  Normal  Mood:  improved and today denies feeling significantly depressed   Affect:  Appropriate and reactive   Thought Process:  Linear  Orientation:  Full (Time, Place, and Person)  Thought Content:  less ruminative about stressors, denies any hallucinations, no delusions   Suicidal Thoughts:  No at this time denies any  suicidal or self injurious ideations, no homicidal ideations  Homicidal Thoughts:  No  Memory:  recent and remote grossly intact   Judgement:  Other:  improving   Insight:  improving  Psychomotor Activity:  Normal  Concentration:  Good  Recall:  Good  Fund of Knowledge:Good  Language: Good  Akathisia:  Negative  Handed:  Right  AIMS (if indicated):     Assets:  Communication Skills Desire for Improvement Resilience  ADL's:  Intact  Cognition: WNL  Sleep:  Number of Hours: 6  Assessment- patient reports overall improvement in mood and today presents with brighter affect, no SI, more future oriented . Tolerating medications well, denies side effects. Focusing on discharge soon Treatment Plan Summary: Daily contact with patient to assess and evaluate symptoms and progress in treatment, Medication management, Plan inpatient treatment  and medications as below  Encourage ongoing group participation to work on coping skills and symptom reduction Continue Cymbalta at 40 mgrs QDAY for depression, pain Continue Abilify 2 mgrs QDAY for antidepressant augmentation Continue Vistaril 25 mgrs Q 6 hours PRN for anxiety as needed  Continue Trazodone 50 mgrs QHS PRN  for insomnia as needed  Treatment team working on disposition planning  Check TSH and Hgb A 1 C COBOS, Felicita Gage, MD 12/03/2015, 3:23 PM

## 2015-12-03 NOTE — BHH Group Notes (Signed)
BHH LCSW Group Therapy 12/03/2015 1:15 PM Type of Therapy: Group Therapy Participation Level: Active  Participation Quality: Attentive, Sharing and Supportive  Affect: Blunted  Cognitive: Alert and Oriented  Insight: Developing/Improving and Engaged  Engagement in Therapy: Developing/Improving and Engaged  Modes of Intervention: Activity, Clarification, Confrontation, Discussion, Education, Exploration, Limit-setting, Orientation, Problem-solving, Rapport Building, Reality Testing, Socialization and Support  Summary of Progress/Problems: Patient was attentive and engaged with speaker from [employer]. Patient was attentive to speaker while they shared their story of dealing with mental health and overcoming it. Patient expressed interest in their programs and services and received information on their agency. Patient processed ways they can relate to the speaker.   Arliss Frisina, LCSW Clinical Social Worker [facility] [phone]   

## 2015-12-04 ENCOUNTER — Ambulatory Visit
Admission: RE | Admit: 2015-12-04 | Discharge: 2015-12-04 | Disposition: A | Discharge status: Home / Self Care | Payer: 59 | Source: Ambulatory Visit | Attending: Orthopedic Surgery | Admitting: Orthopedic Surgery

## 2015-12-04 DIAGNOSIS — M25511 Pain in right shoulder: Secondary | ICD-10-CM

## 2015-12-04 LAB — TSH: TSH: 7.285 u[IU]/mL — AB (ref 0.350–4.500)

## 2015-12-04 MED ORDER — DULOXETINE HCL 40 MG PO CPEP: 40.0000 mg | ORAL_CAPSULE | Freq: Every day | ORAL | Status: DC

## 2015-12-04 MED ORDER — IOHEXOL 180 MG/ML  SOLN
13.0000 mL | Freq: Once | INTRAMUSCULAR | Status: AC | PRN
  Administered 2015-12-04: 13 mL via INTRA_ARTICULAR

## 2015-12-04 MED ORDER — ARIPIPRAZOLE 2 MG PO TABS: 2.0000 mg | ORAL_TABLET | Freq: Every day | ORAL | Status: DC

## 2015-12-04 MED ORDER — TRAZODONE HCL 50 MG PO TABS: 50.0000 mg | ORAL_TABLET | Freq: Every evening | ORAL | Status: DC | PRN

## 2015-12-04 NOTE — Plan of Care (Signed)
Problem: Ineffective individual coping Goal: STG: Patient will remain free from self harm Outcome: Progressing Pt safe on the unit at this time     

## 2015-12-04 NOTE — BHH Suicide Risk Assessment (Addendum)
[facility] Discharge Suicide Risk Assessment   Principal Problem: Major depression, recurrent, chronic [facility]) Discharge Diagnoses:  Patient Active Problem List   Diagnosis Date Noted  . Major depression, recurrent, chronic (HCC) [F33.9] 12/02/2015  . Osteoarthritis of left knee [M17.9] 02/26/2013    Class: Diagnosis of    Total Time spent with patient: 30 minutes  Musculoskeletal: Strength & Muscle Tone: within normal limits Gait & Station: normal Patient leans: N/A  Psychiatric Specialty Exam: ROS  Blood pressure 132/85, pulse 103, temperature 98.6 F (37 C), temperature source Oral, resp. rate 16, height  (1.651 m), weight 309 lb (140.161 kg), SpO2 97 %.Body mass index is 51.42 kg/(m^2).  General Appearance: Well Groomed  Eye Contact::  Good  Speech:  Normal Rate409  Volume:  Normal  Mood:  improved, presents euthymic   Affect:  Appropriate and Full Range  Thought Process:  Linear  Orientation:  Full (Time, Place, and Person)  Thought Content:  no hallucinations, no delusions  Suicidal Thoughts:  No denies any suicidal ideations, denies any self injurious ideations   Homicidal Thoughts:  No  Memory:  recent and remote grossly intact   Judgement:   Improved   Insight:  improved   Psychomotor Activity:  Normal  Concentration:  Good  Recall:  Good  Fund of Knowledge:Good  Language: Good  Akathisia:  Negative  Handed:  Right  AIMS (if indicated):     Assets:  Desire for Improvement Resilience Social Support  Sleep:  Number of Hours: 6  Cognition: WNL  ADL's:  Intact   Mental Status Per Nursing Assessment::   On Admission:  NA  Demographic Factors:  50 year old married female, three children  Loss Factors: 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 Denies medication side effects  We reviewed medications, side effects, and also slightly elevated TSH - patient expressed understanding and plans to follow up with her PCP regarding further workup for TSH elevation  Cognitive Features That Contribute To Risk:  No gross cognitive deficits noted upon discharge. Is alert , attentive, and oriented x 3   Suicide Risk:  Mild:  Suicidal ideation of limited frequency, intensity, duration, and specificity.  There are no identifiable plans, no associated intent, mild dysphoria and related symptoms, good self-control (both objective and subjective assessment), few other risk factors, and identifiable protective factors, including available and accessible social support.  Follow-up Information    Follow up with [facility]  On 12/22/2015.   Why:  hospital follow up appointment with Clinton Sawyer on Tuesday March 21st at 10:30am. Call office if you need to reschedule.   Contact information:   [address] [place], [place] [address] [phone]      Plan Of Care/Follow-up recommendations:  Activity:  as tolerated  Diet:  heart healthy  Tests:  NA Other:  see below  Patient is leaving unit in good spirits  States that her husband is going to pick her up  later today Follow up as above  Nehemiah MassedOBOS, Sacheen Arrasmith, MD 12/04/2015, 11:05 AM

## 2015-12-04 NOTE — Progress Notes (Signed)
Patient ID: Maria Hancock A [place], female   DOB: 05/06/1966, 50 y.o.   MRN: [id number]  Pt. Denies SI/HI and A/V hallucinations. Belongings returned to patient at time of discharge. Patient denies any new onset of pain or discomfort. Discharge instructions and medications were reviewed with patient. Patient verbalized understanding of both medications and discharge instructions. Q15 minute safety checks maintained until discharge. No distress upon discharge. Patient was discharged to lobby where her husband was waiting to pick her up.

## 2015-12-04 NOTE — Progress Notes (Addendum)
D: Pt denies SI/HI/AVH. Pt is pleasant and cooperative. Pt forwards little information at this time. Pt seen interacting with visitor earlier in the night. Pt stated she was ready to leave and go spend time with her husband.   A: Pt was offered support and encouragement. Pt was given scheduled medications. Pt was encourage to attend groups. Q 15 minute checks were done for safety.   R: Pt is taking medication. Pt has no complaints at this time.Pt receptive to treatment and safety maintained on unit.

## 2015-12-04 NOTE — Progress Notes (Signed)
  [facility] :  Will you be returning to the same living situation after discharge:  Yes,  patient plans to return home with her husband At discharge, do you have transportation home?: Yes,  patient's husband will transport Do you have the ability to pay for your medications: Yes,  patient will be provided with prescriptions at discharge  Release of information consent forms completed and in the chart;  Patient's signature needed at discharge.  Patient to Follow up at: Follow-up Information    Follow up with [facility]  On 12/22/2015.   Why:  hospital follow up appointment with Clinton SawyerJenise Moore on Tuesday March 21st at 10:30am. Call office if you need to reschedule.   Contact information:   [address] [place], [place] [id number] [phone]      Next level of care provider [facility]:yes  Safety Planning and Suicide Prevention discussed: Yes,  with patient and husband  Have you used any form of tobacco in the last 30 days? (Cigarettes, Smokeless Tobacco, Cigars, and/or Pipes): No  Has patient been referred to the Quitline?: N/A patient is not a smoker  Patient has been referred for addiction treatment: N/A  Daielle Melcher, [place] L 12/04/2015, 10:54 AM

## 2015-12-04 NOTE — Discharge Summary (Signed)
Physician Discharge Summary Note  Patient:  Maria Hancock is an 50 y.o., female MRN:  [id number] DOB:  1966/07/13 Patient phone:  [phone] (home)  Patient address:   [address] [place] [address],  Total Time spent with patient: 45 minutes  Date of Admission:  12/01/2015 Date of Discharge: 12/04/2015  Reason for Admission: Maria Hancock, 50 y.o.female initially presented to [facility] after a PCP visit wherein patient told her their MD that she was having suicidal ideations. Patient denied HI, SHI and AVH. Patient stated that she has been depressed in the last month due to declining health and problems with her employer regarding her declining health and work issues. She then developed passive suicidal ideations. She reports history of depression, anxiety, mainly excessive worrying, denies history of mania, denies history of psychosis, denies prior suicide attempts. Patient had been recently had been on Abilify and Viibryd, however she states she had not been on the medication for a long time.  Principal Problem: Major depression, recurrent, chronic [facility]) Discharge Diagnoses: Patient Active Problem List   Diagnosis Date Noted  . Major depression, recurrent, chronic (HCC) [F33.9] 12/02/2015  . Osteoarthritis of left knee [M17.9] 02/26/2013    Class: Diagnosis of    Past Psychiatric History: MDD  Past Medical History:  Past Medical History  Diagnosis Date  . Anxiety   . IBS (irritable bowel syndrome)   . Menorrhagia   . GERD (gastroesophageal reflux disease)     with Mobic  . Arthritis     "knees, back, hands" (02/26/2013)  . Migraine headache   . Depression     Past Surgical History  Procedure Laterality Date  . Tubal ligation  1988  . Replacement total knee Left 02/26/2013    "w/hardware removal" (02/26/2013)  . Knee arthroplasty Left ?2001    "put hardware in" (02/26/2013)  . Knee arthroscopy Left     "I've had 2; 2nd one was 2013" (02/26/2013)  . Joint  replacement    . Total knee arthroplasty Left 02/26/2013    Procedure: TOTAL KNEE ARTHROPLASTY;  Surgeon: Cammy Copa, MD;  Location: [facility] OR;  Service: Orthopedics;  Laterality: Left;  Left Total Knee Arthroplasty   . Hardware removal Left 02/26/2013    Procedure: HARDWARE REMOVAL;  Surgeon: Cammy Copa, MD;  Location: [facility] OR;  Service: Orthopedics;  Laterality: Left;  Left Knee Removal of Hardware   Family History: History reviewed. No pertinent family history. Family Psychiatric  History: See HPI Social History:  History  Alcohol Use No    Comment: denied all alcohol use     History  Drug Use No    Comment: denied all drug/alcohol/tobacco use    Social History   Social History  . Marital Status: Married    Spouse Name: N/A  . Number of Children: N/A  . Years of Education: N/A   Social History Main Topics  . Smoking status: Former Smoker -- 0.50 packs/day for 16 years    Types: Cigarettes    Quit date: 10/04/1995  . Smokeless tobacco: Never Used  . Alcohol Use: No     Comment: denied all alcohol use  . Drug Use: No     Comment: denied all drug/alcohol/tobacco use  . Sexual Activity: Yes    Birth Control/ Protection: IUD     Comment: IUD in placed   Other Topics Concern  . None   Social History Narrative    Hospital Course:  Jerline Linzy Dikes was admitted  for Major depression, recurrent, chronic (HCC) and crisis management.  She was treated with the following medications Continue Cymbalta at 40 mgrs QDAY for depression, pain,  Abilify 2 mgrs QDAY for antidepressant augmentation, Vistaril 25 mgrs Q 6 hours PRN for anxiety as needed, Trazodone 50 mgrs QHS PRN for insomnia as needed.  Abbrielle Batts Gary was discharged with current medication and was instructed on how to take medications as prescribed; (details listed below under Medication List).  Medical problems were identified and treated as needed.  Home medications were restarted as  appropriate.  Improvement was monitored by observation and Georgeanna Harrison Suber daily report of symptom reduction.  Emotional and mental status was monitored by daily self-inventory reports completed by Dena Billet and clinical staff.         Marleta Lapierre Franklyn was evaluated by the treatment team for stability and plans for continued recovery upon discharge.  Briza Bark Brickman motivation was an integral factor for scheduling further treatment.  Employment, transportation, bed availability, health status, family support, and any pending legal issues were also considered during her hospital stay.  She was offered further treatment options upon discharge including but not limited to Residential, Intensive Outpatient, and Outpatient treatment.  Jennavecia Schwier Lacey will follow up with the services as listed below under Follow Up Information.     Upon completion of this admission the NAHARA DONA was both mentally and medically stable for discharge denying suicidal/homicidal ideation, auditory/visual/tactile hallucinations, delusional thoughts and paranoia.     Physical Findings: AIMS: Facial and Oral Movements Muscles of Facial Expression: None, normal Lips and Perioral Area: None, normal Jaw: None, normal Tongue: None, normal,Extremity Movements Upper (arms, wrists, hands, fingers): None, normal Lower (legs, knees, ankles, toes): None, normal, Trunk Movements Neck, shoulders, hips: None, normal, Overall Severity Severity of abnormal movements (highest score from questions above): None, normal Incapacitation due to abnormal movements: None, normal Patient's awareness of abnormal movements (rate only patient's report): No Awareness, Dental Status Current problems with teeth and/or dentures?: No Does patient usually wear dentures?: No  CIWA:  CIWA-Ar Total: 2 COWS:  COWS Total Score: 4  Musculoskeletal: Strength & Muscle Tone: within normal limits Gait & Station: normal Patient leans:  N/A  Psychiatric Specialty Exam: See MD SRA ROS  Blood pressure 132/85, pulse 103, temperature 98.6 F (37 C), temperature source Oral, resp. rate 16, height  (1.651 m), weight 140.161 kg (309 lb), SpO2 97 %.Body mass index is 51.42 kg/(m^2).   Have you used any form of tobacco in the last 30 days? (Cigarettes, Smokeless Tobacco, Cigars, and/or Pipes): No  Has this patient used any form of tobacco in the last 30 days? (Cigarettes, Smokeless Tobacco, Cigars, and/or Pipes)  No  Blood Alcohol level:  No results found for: [facility]  Metabolic Disorder Labs:  No results found for: HGBA1C, MPG No results found for: PROLACTIN No results found for: CHOL, TRIG, HDL, CHOLHDL, VLDL, LDLCALC  See Psychiatric Specialty Exam and Suicide Risk Assessment completed by Attending Physician prior to discharge.  Discharge destination:  Home  Is patient on multiple antipsychotic therapies at discharge:  No   Has Patient had three or more failed trials of antipsychotic monotherapy by history:  No  Recommended Plan for Multiple Antipsychotic Therapies: NA     Medication List    STOP taking these medications        cyclobenzaprine 10 MG tablet  Commonly known as:  FLEXERIL     naproxen 500 MG tablet  Commonly known as:  NAPROSYN     VIIBRYD 40 MG Tabs  Generic drug:  Vilazodone HCl      TAKE these medications      Indication   ARIPiprazole 2 MG tablet  Commonly known as:  ABILIFY  Take 1 tablet (2 mg total) by mouth daily.   Indication:  mood stabilization     DULoxetine HCl 40 MG Cpep  Take 40 mg by mouth daily.   Indication:  Major Depressive Disorder     traZODone 50 MG tablet  Commonly known as:  DESYREL  Take 1 tablet (50 mg total) by mouth at bedtime as needed for sleep.   Indication:  Trouble Sleeping           Follow-up Information    Follow up with [facility]  On 12/22/2015.   Why:  hospital follow up appointment with Clinton SawyerJenise Moore on Tuesday March 21st at  10:30am. Call office if you need to reschedule.   Contact information:   [address] [place], [place] [address] [phone]      Follow-up recommendations:  Activity:  Increase activity as tolerated Diet:  Regular house diet Tests:  Will need TSH levels in 1 month. Will need a1c for diabetes, blood count, and cholesterol levels to be checked.   Comments:  Take all medications as prescribed. Keep all follow-up appointments as scheduled.  Do not consume alcohol or use illegal drugs while on prescription medications. Report any adverse effects from your medications to your primary care provider promptly.  In the event of recurrent symptoms or worsening symptoms, call 911, a crisis hotline, or go to the nearest emergency department for evaluation. Your TSH was 7.285 on admission, will need to have follow up labs done in a timely manner, suspect new onset of hypothyroidism.   Signed: Truman Haywardakia S Starkes, FNP 12/04/2015, 11:39 AM   Patient seen, Suicide Assessment Completed.  Disposition Plan Reviewed

## 2015-12-04 NOTE — Progress Notes (Signed)
Recreation Therapy Notes  Date: 03.03.2017 Time: 9:30am Location: [address] Dayroom   Group Topic: Stress Management  Goal Area(s) Addresses:  Patient will actively participate in stress management techniques presented during session.   Behavioral Response: Did not attend.   Karyl Sharrar L Promyse Ardito, LRT/CTRS         Toshiye Kever L 12/04/2015 10:22 AM 

## 2015-12-04 NOTE — [facility] Suicide Risk Assessment (Signed)
[facility] INPATIENT:  Family/Significant Other Suicide Prevention Education  Suicide Prevention Education:  Education Completed; husband Joesphine Bareric Poehlen [phone],  (name of family member/significant other) has been identified by the patient as the family member/significant other with whom the patient will be residing, and identified as the person(s) who will aid the patient in the event of a mental health crisis (suicidal ideations/suicide attempt).  With written consent from the patient, the family member/significant other has been provided the following suicide prevention education, prior to the and/or following the discharge of the patient.  The suicide prevention education provided includes the following:  Suicide risk factors  Suicide prevention and interventions  National Suicide Hotline telephone number  [facility] assessment telephone number  [facility] Emergency Assistance 911  [facility] and/or Residential Mobile Crisis Unit telephone number  Request made of family/significant other to:  Remove weapons (e.g., guns, rifles, knives), all items previously/currently identified as safety concern.    Remove drugs/medications (over-the-counter, prescriptions, illicit drugs), all items previously/currently identified as a safety concern.  The family member/significant other verbalizes understanding of the suicide prevention education information provided.  The family member/significant other agrees to remove the items of safety concern listed above.  Madellyn Denio, West CarboKristin L 12/04/2015, 8:26 AM

## 2015-12-05 LAB — HEMOGLOBIN A1C
Hgb A1c MFr Bld: 7.3 % — ABNORMAL HIGH (ref 4.8–5.6)
Mean Plasma Glucose: 163 mg/dL

## 2015-12-11 ENCOUNTER — Telehealth (HOSPITAL_COMMUNITY): Payer: Self-pay

## 2015-12-11 NOTE — Telephone Encounter (Signed)
Dr. Jama Flavorsobos called me and asked that I call the patient and give her the results of her A1c. I called patient and let her know that the result was elevated and that she should contact her PCP, patient informed me that she has an appointment Monday with the PCP and she would be sure to let her know.

## 2016-07-22 ENCOUNTER — Encounter: Payer: Self-pay | Admitting: Emergency Medicine

## 2016-07-22 ENCOUNTER — Emergency Department
Admission: EM | Admit: 2016-07-22 | Discharge: 2016-07-22 | Disposition: A | Discharge status: Home / Self Care | Payer: 59 | Attending: Emergency Medicine | Admitting: Emergency Medicine

## 2016-07-22 DIAGNOSIS — R739 Hyperglycemia, unspecified: Secondary | ICD-10-CM

## 2016-07-22 DIAGNOSIS — Z79899 Other long term (current) drug therapy: Secondary | ICD-10-CM | POA: Insufficient documentation

## 2016-07-22 DIAGNOSIS — E1165 Type 2 diabetes mellitus with hyperglycemia: Secondary | ICD-10-CM | POA: Insufficient documentation

## 2016-07-22 DIAGNOSIS — Z87891 Personal history of nicotine dependence: Secondary | ICD-10-CM | POA: Insufficient documentation

## 2016-07-22 HISTORY — DX: Type 2 diabetes mellitus without complications: E11.9

## 2016-07-22 LAB — COMPREHENSIVE METABOLIC PANEL
ALBUMIN: 3.6 g/dL (ref 3.5–5.0)
ALT: 33 U/L (ref 14–54)
ANION GAP: 10 (ref 5–15)
AST: 39 U/L (ref 15–41)
Alkaline Phosphatase: 101 U/L (ref 38–126)
BILIRUBIN TOTAL: 1 mg/dL (ref 0.3–1.2)
BUN: 15 mg/dL (ref 6–20)
CHLORIDE: 101 mmol/L (ref 101–111)
CO2: 24 mmol/L (ref 22–32)
Calcium: 8.9 mg/dL (ref 8.9–10.3)
Creatinine, Ser: 0.88 mg/dL (ref 0.44–1.00)
GFR calc Af Amer: >60 mL/min (ref >60)
GFR calc non Af Amer: >60 mL/min (ref >60)
GLUCOSE: 309 mg/dL — AB (ref 65–99)
POTASSIUM: 3.9 mmol/L (ref 3.5–5.1)
Sodium: 135 mmol/L (ref 135–145)
TOTAL PROTEIN: 7.1 g/dL (ref 6.5–8.1)

## 2016-07-22 LAB — GLUCOSE, CAPILLARY: GLUCOSE-CAPILLARY: 254 mg/dL — AB (ref 65–99)

## 2016-07-22 LAB — URINALYSIS COMPLETE WITH MICROSCOPIC (ARMC ONLY)
BACTERIA UA: NONE SEEN
Bilirubin Urine: NEGATIVE
Glucose, UA: 50 mg/dL — AB
Ketones, ur: NEGATIVE mg/dL
LEUKOCYTES UA: NEGATIVE
Nitrite: NEGATIVE
PH: 6 (ref 5.0–8.0)
PROTEIN: NEGATIVE mg/dL
Specific Gravity, Urine: 1.014 (ref 1.005–1.030)

## 2016-07-22 LAB — CBC WITH DIFFERENTIAL/PLATELET
BASOS ABS: 0.1 10*3/uL (ref 0–0.1)
Basophils Relative: 1 %
Eosinophils Absolute: 0.2 10*3/uL (ref 0–0.7)
Eosinophils Relative: 2 %
HEMATOCRIT: 44.5 % (ref 35.0–47.0)
Hemoglobin: 15.1 g/dL (ref 12.0–16.0)
LYMPHS ABS: 1.9 10*3/uL (ref 1.0–3.6)
LYMPHS PCT: 24 %
MCH: 32.1 pg (ref 26.0–34.0)
MCHC: 34 g/dL (ref 32.0–36.0)
MCV: 94.5 fL (ref 80.0–100.0)
MONO ABS: 0.4 10*3/uL (ref 0.2–0.9)
Monocytes Relative: 5 %
NEUTROS ABS: 5.5 10*3/uL (ref 1.4–6.5)
Neutrophils Relative %: 68 %
Platelets: 259 10*3/uL (ref 150–440)
RBC: 4.71 MIL/uL (ref 3.80–5.20)
RDW: 13.5 % (ref 11.5–14.5)
WBC: 8.2 10*3/uL (ref 3.6–11.0)

## 2016-07-22 MED ORDER — METFORMIN HCL 500 MG PO TABS: 500.0000 mg | ORAL_TABLET | Freq: Two times a day (BID) | ORAL | 0 refills | Status: DC

## 2016-07-22 NOTE — ED Triage Notes (Signed)
Pt to ed with c/o weakness and dizziness. Pt states she has not had diabetic meds since June.  Pt also reports numbness in feet bilat and open sores on bilat legs with poor healing.

## 2016-07-22 NOTE — Discharge Instructions (Signed)
You're being started on new medication called metformin to help with diabetes and hypertension sugars.  He will need to be rechecked in about 2 weeks to see how your blood sugars have been doing, because she may need to go up in dose.  You may follow up with your previous primary provider, or you may try any of the other clinics listed here for you.  Return to the emergency room for any worsening condition including vision changes, dizziness or passing out, palpitations or chest pain, one-sided weakness or numbness or slurred speech or any other symptoms concerning to you.

## 2016-07-22 NOTE — ED Notes (Signed)
Pt a/o, vss. Bilateral shin areas that are dry, red. No s/s of infection. No drainage. + 1 pulses bilateral. C/o numb toes - toes cool, full rom, no wounds noted.

## 2016-07-22 NOTE — ED Provider Notes (Signed)
[facility] Emergency Department Provider Note ____________________________________________   I have reviewed the triage vital signs and the triage nursing note.  HISTORY  Chief Complaint Dizziness and Hyperglycemia   Historian Patient  HPI Maria Hancock is a 50 y.o. female who is presenting today after several months of being out of hertrulicity, last used in June after she lost her insurance. She also has not been able to get back in with her nurse practitioner because she does not have insurance. She is hoping to get her insurance back by the new year.  She states that she occasionally has headaches. She occasionally feels lightheaded and dizzy. She occasionally has nausea. Currently she has no headache or fever or illness. She came today rather than any other day because she is just sick of not feeling well and is worried that she is not on medicine for diabetes right now.    Past Medical History:  Diagnosis Date  . Anxiety   . Arthritis    "knees, back, hands" (02/26/2013)  . Depression   . Diabetes mellitus without complication (HCC)   . GERD (gastroesophageal reflux disease)    with Mobic  . IBS (irritable bowel syndrome)   . Menorrhagia   . Migraine headache     Patient Active Problem List   Diagnosis Date Noted  . Major depression, recurrent, chronic (HCC) 12/02/2015  . Osteoarthritis of left knee 02/26/2013    Class: Diagnosis of    Past Surgical History:  Procedure Laterality Date  . HARDWARE REMOVAL Left 02/26/2013   Procedure: HARDWARE REMOVAL;  Surgeon: Cammy Copa, MD;  Location: [facility] OR;  Service: Orthopedics;  Laterality: Left;  Left Knee Removal of Hardware  . JOINT REPLACEMENT    . KNEE ARTHROPLASTY Left ?2001   "put hardware in" (02/26/2013)  . KNEE ARTHROSCOPY Left    "I've had 2; 2nd one was 2013" (02/26/2013)  . REPLACEMENT TOTAL KNEE Left 02/26/2013   "w/hardware removal" (02/26/2013)  . TOTAL KNEE ARTHROPLASTY  Left 02/26/2013   Procedure: TOTAL KNEE ARTHROPLASTY;  Surgeon: Cammy Copa, MD;  Location: [facility] OR;  Service: Orthopedics;  Laterality: Left;  Left Total Knee Arthroplasty   . TUBAL LIGATION  1988    Prior to Admission medications   Medication Sig Start Date End Date Taking? Authorizing Provider  ARIPiprazole (ABILIFY) 2 MG tablet Take 1 tablet (2 mg total) by mouth daily. 12/04/15   Adonis Brook, NP  DULoxetine HCl 40 MG CPEP Take 40 mg by mouth daily. 12/04/15   Truman Hayward, FNP  traZODone (DESYREL) 50 MG tablet Take 1 tablet (50 mg total) by mouth at bedtime as needed for sleep. 12/04/15   Adonis Brook, NP    No Known Allergies  History reviewed. No pertinent family history.  Social History Social History  Substance Use Topics  . Smoking status: Former Smoker    Packs/day: 0.50    Years: 16.00    Types: Cigarettes    Quit date: 10/04/1995  . Smokeless tobacco: Never Used  . Alcohol use No     Comment: denied all alcohol use    Review of Systems  Constitutional: Negative for fever. Eyes: Negative for visual changes. ENT: Negative for sore throat. Cardiovascular: Negative for chest pain. Respiratory: Negative for shortness of breath. Gastrointestinal: Negative for abdominal pain, vomiting and diarrhea. Genitourinary: Negative for dysuria. Musculoskeletal: Negative for back pain. Skin: Negative for rash. Neurological: Occasional dizziness and mild headaches intermittently. 10 point Review of Systems otherwise  negative ____________________________________________   PHYSICAL EXAM:  VITAL SIGNS: ED Triage Vitals  Enc Vitals Group     BP 07/22/16 0719 (!) 134/93     Pulse Rate 07/22/16 0719 93     Resp --      Temp 07/22/16 0719 98.3 F (36.8 C)     Temp Source 07/22/16 0719 Oral     SpO2 07/22/16 0719 96 %     Weight 07/22/16 0713 300 lb (136.1 kg)     Height 07/22/16 0713 5\' 5"  (1.651 m)     Head Circumference --      Peak Flow --      Pain Score  07/22/16 0714 4     Pain Loc --      Pain Edu? --      Excl. in GC? --      Constitutional: Alert and oriented. Well appearing and in no distress. HEENT   Head: Normocephalic and atraumatic.      Eyes: Conjunctivae are normal. PERRL. Normal extraocular movements.      Ears:         Nose: No congestion/rhinnorhea.   Mouth/Throat: Mucous membranes are moist.   Neck: No stridor. Cardiovascular/Chest: Normal rate, regular rhythm.  No murmurs, rubs, or gallops. Respiratory: Normal respiratory effort without tachypnea nor retractions. Breath sounds are clear and equal bilaterally. No wheezes/rales/rhonchi. Gastrointestinal: Soft. No distention, no guarding, no rebound. Nontender.  Obese  Genitourinary/rectal:Deferred Musculoskeletal: Nontender with normal range of motion in all extremities. No joint effusions.  No lower extremity tenderness.  No edema. Neurologic:  Normal speech and language. No gross or focal neurologic deficits are appreciated. Skin:  Skin is warm, dry and intact. No rash noted. Psychiatric: Mood and affect are normal. Speech and behavior are normal. Patient exhibits appropriate insight and judgment.   ____________________________________________  LABS (pertinent positives/negatives)  Labs Reviewed  GLUCOSE, CAPILLARY - Abnormal; Notable for the following:       Result Value   Glucose-Capillary 254 (*)    All other components within normal limits  COMPREHENSIVE METABOLIC PANEL  CBC WITH DIFFERENTIAL/PLATELET  URINALYSIS COMPLETEWITH MICROSCOPIC ([facility] ONLY)    ____________________________________________    EKG I, Governor Rooks, MD, the attending physician have personally viewed and interpreted all ECGs.  100 bpm. Normal sinus rhythm. Narrow QRS. Normal axis. Normal ST and T-wave ____________________________________________  RADIOLOGY All Xrays were viewed by me. Imaging interpreted by  Radiologist.  None __________________________________________  PROCEDURES  Procedure(s) performed: None  Critical Care performed: None  ____________________________________________   ED COURSE / ASSESSMENT AND PLAN  Pertinent labs & imaging results that were available during my care of the patient were reviewed by me and considered in my medical decision making (see chart for details).   Ms. Miguez is here requesting medication for hyperglycemia. She states her blood sugars have been elevated since she's been off of oral hypoglycemic for type 2 diabetes several months ago due to loss of insurance.  She had previously been on Trulicity, but states she cannot afford without insurance. She also has not been able to go back to her provider due to lack of insurance.  She is a couple of nonspecific complaints including mild dizziness and headaches which are intermittent, but no focal neurologic complaints or deficits on exam. She is well-appearing with a normal physical exam other than obesity here today.  I discussed with her checking set of laboratory studies to make sure dizziness is not related to I disturbance or anemia, and to check  her LFTs prior to starting metformin.  I will start her on metformin. I [facility], [facility], or [facility] to help with access to primary care and follow-up for diabetes.    CONSULTATIONS:   None   Patient / Family / Caregiver informed of clinical course, medical decision-making process, and agree with plan.   I discussed return precautions, follow-up instructions, and discharge instructions with patient and/or family.   ___________________________________________   FINAL CLINICAL IMPRESSION(S) / ED DIAGNOSES   Final diagnoses:  None              Note: This dictation was prepared with Dragon dictation. Any transcriptional errors that result from this process are unintentional     Governor Rooksebecca Zimal Weisensel, MD 07/22/16 1032

## 2016-07-29 ENCOUNTER — Telehealth (INDEPENDENT_AMBULATORY_CARE_PROVIDER_SITE_OTHER): Payer: Self-pay | Admitting: *Deleted

## 2016-07-29 NOTE — Telephone Encounter (Signed)
Pt. Called stating she had some concerns about her 20 lb. Weight restriction. Pt. Requesting call back at [phone].

## 2016-07-29 NOTE — Telephone Encounter (Signed)
IC patient and discussed.  She is asking if you will write her a note stating no lifting restrictions.  This is for her insurance, she has no health insurance.  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  She says the right shld is doing pretty good.  She is s/p right shld scope, DCE on 12/21/2015.  Please advise on note.

## 2016-07-30 NOTE — Telephone Encounter (Signed)
Ok for note pls call thx 

## 2016-08-02 NOTE — Telephone Encounter (Signed)
Note done, put at front, IC pt and advised.

## 2016-08-03 ENCOUNTER — Telehealth (INDEPENDENT_AMBULATORY_CARE_PROVIDER_SITE_OTHER): Payer: Self-pay | Admitting: Orthopedic Surgery

## 2016-08-03 NOTE — Telephone Encounter (Signed)
Patient would like to have her note faxed.  Contact Info: Fax: [phone]

## 2016-08-04 NOTE — Telephone Encounter (Signed)
Note has been faxed.

## 2016-08-15 DIAGNOSIS — E1142 Type 2 diabetes mellitus with diabetic polyneuropathy: Secondary | ICD-10-CM | POA: Insufficient documentation

## 2016-08-15 DIAGNOSIS — F331 Major depressive disorder, recurrent, moderate: Secondary | ICD-10-CM | POA: Insufficient documentation

## 2016-08-15 DIAGNOSIS — L409 Psoriasis, unspecified: Secondary | ICD-10-CM | POA: Insufficient documentation

## 2016-10-27 DIAGNOSIS — N926 Irregular menstruation, unspecified: Secondary | ICD-10-CM | POA: Insufficient documentation

## 2017-01-27 DIAGNOSIS — I10 Essential (primary) hypertension: Secondary | ICD-10-CM | POA: Insufficient documentation

## 2017-05-16 DIAGNOSIS — R42 Dizziness and giddiness: Secondary | ICD-10-CM | POA: Insufficient documentation

## 2017-05-18 DIAGNOSIS — R946 Abnormal results of thyroid function studies: Secondary | ICD-10-CM | POA: Insufficient documentation

## 2017-05-18 DIAGNOSIS — R400 Somnolence: Secondary | ICD-10-CM | POA: Insufficient documentation

## 2017-05-28 ENCOUNTER — Encounter: Payer: Self-pay | Admitting: Emergency Medicine

## 2017-05-28 ENCOUNTER — Emergency Department
Admission: EM | Admit: 2017-05-28 | Discharge: 2017-05-28 | Disposition: A | Discharge status: Home / Self Care | Payer: Worker's Compensation | Attending: Emergency Medicine | Admitting: Emergency Medicine

## 2017-05-28 ENCOUNTER — Emergency Department: Payer: Worker's Compensation

## 2017-05-28 DIAGNOSIS — S61258A Open bite of other finger without damage to nail, initial encounter: Secondary | ICD-10-CM

## 2017-05-28 DIAGNOSIS — Y9259 Other trade areas as the place of occurrence of the external cause: Secondary | ICD-10-CM | POA: Insufficient documentation

## 2017-05-28 DIAGNOSIS — Z7984 Long term (current) use of oral hypoglycemic drugs: Secondary | ICD-10-CM | POA: Insufficient documentation

## 2017-05-28 DIAGNOSIS — E119 Type 2 diabetes mellitus without complications: Secondary | ICD-10-CM | POA: Insufficient documentation

## 2017-05-28 DIAGNOSIS — W540XXA Bitten by dog, initial encounter: Secondary | ICD-10-CM

## 2017-05-28 DIAGNOSIS — Z87891 Personal history of nicotine dependence: Secondary | ICD-10-CM | POA: Insufficient documentation

## 2017-05-28 DIAGNOSIS — Y93K3 Activity, grooming and shearing an animal: Secondary | ICD-10-CM | POA: Insufficient documentation

## 2017-05-28 DIAGNOSIS — Z23 Encounter for immunization: Secondary | ICD-10-CM | POA: Diagnosis not present

## 2017-05-28 DIAGNOSIS — Z96652 Presence of left artificial knee joint: Secondary | ICD-10-CM | POA: Insufficient documentation

## 2017-05-28 DIAGNOSIS — S6982XA Other specified injuries of left wrist, hand and finger(s), initial encounter: Secondary | ICD-10-CM | POA: Diagnosis present

## 2017-05-28 DIAGNOSIS — Y990 Civilian activity done for income or pay: Secondary | ICD-10-CM | POA: Diagnosis not present

## 2017-05-28 DIAGNOSIS — S61238A Puncture wound without foreign body of other finger without damage to nail, initial encounter: Secondary | ICD-10-CM

## 2017-05-28 DIAGNOSIS — S61231A Puncture wound without foreign body of left index finger without damage to nail, initial encounter: Secondary | ICD-10-CM | POA: Diagnosis not present

## 2017-05-28 MED ORDER — TETANUS-DIPHTH-ACELL PERTUSSIS 5-2.5-18.5 LF-MCG/0.5 IM SUSP
0.5000 mL | Freq: Once | INTRAMUSCULAR | Status: AC
  Administered 2017-05-28: 0.5 mL via INTRAMUSCULAR
  Filled 2017-05-28: qty 0.5

## 2017-05-28 MED ORDER — AMOXICILLIN-POT CLAVULANATE 875-125 MG PO TABS: 1.0000 | ORAL_TABLET | Freq: Two times a day (BID) | ORAL | 0 refills | Status: AC

## 2017-05-28 MED ORDER — BACITRACIN ZINC 500 UNIT/GM EX OINT
TOPICAL_OINTMENT | Freq: Once | CUTANEOUS | Status: AC
Start: 2017-05-28 — End: 2017-05-28
  Administered 2017-05-28: 1 via TOPICAL
  Filled 2017-05-28: qty 0.9

## 2017-05-28 MED ORDER — AMOXICILLIN-POT CLAVULANATE 875-125 MG PO TABS
1.0000 | ORAL_TABLET | Freq: Once | ORAL | Status: AC
  Administered 2017-05-28: 1 via ORAL
  Filled 2017-05-28: qty 1

## 2017-05-28 NOTE — ED Triage Notes (Addendum)
Pt works as a International aid/development worker at [employer], was grooming an older pomeranian around [employer] this morning and was bit on the left index finger. Pt washed with soap and water. Pt states dog is up to date on Rabies vaccine. She states animal control should have been called by [employer]. Pt has bite wrapped in bandaid.

## 2017-05-28 NOTE — ED Notes (Signed)

## 2017-05-28 NOTE — ED Notes (Signed)
See triage note. Pt c/o bite to L index finger this morning at 1130. Single puncture mark noted, scabbed over, covered with bandaid. Pt states dog had an up-to-date rabies vaccine and site was cleansed immediately after incident.

## 2017-05-28 NOTE — ED Provider Notes (Signed)
[facility]-EMERGENCY DEPARTMENT Provider Note   CSN: [id number] Arrival date & time: 05/28/17  1654     History   Chief Complaint Chief Complaint  Patient presents with  . Animal Bite    HPI Maria Hancock is a 51 y.o. femalepresents to the emergency department for evaluation of left index finger dog bite that occurred around 11:30 AM today at work. Patient is a groomer at [employer], was bitten by a small dog to the left index finger, tetanus is not up-to-date.Dog's rabies vaccinations are up-to-date. Animal control was notified. [employer].Patient's pain is 4 out of 10. She has not had any medication for pain. She cleansed the wound thoroughly.  HPI  Past Medical History:  Diagnosis Date  . Anxiety   . Arthritis    "knees, back, hands" (02/26/2013)  . Depression   . Diabetes mellitus without complication (HCC)   . GERD (gastroesophageal reflux disease)    with Mobic  . IBS (irritable bowel syndrome)   . Menorrhagia   . Migraine headache     Patient Active Problem List   Diagnosis Date Noted  . Major depression, recurrent, chronic (HCC) 12/02/2015  . Osteoarthritis of left knee 02/26/2013    Class: Diagnosis of    Past Surgical History:  Procedure Laterality Date  . HARDWARE REMOVAL Left 02/26/2013   Procedure: HARDWARE REMOVAL;  Surgeon: Cammy Copa, MD;  Location: [facility] OR;  Service: Orthopedics;  Laterality: Left;  Left Knee Removal of Hardware  . JOINT REPLACEMENT    . KNEE ARTHROPLASTY Left ?2001   "put hardware in" (02/26/2013)  . KNEE ARTHROSCOPY Left    "I've had 2; 2nd one was 2013" (02/26/2013)  . REPLACEMENT TOTAL KNEE Left 02/26/2013   "w/hardware removal" (02/26/2013)  . TOTAL KNEE ARTHROPLASTY Left 02/26/2013   Procedure: TOTAL KNEE ARTHROPLASTY;  Surgeon: Cammy Copa, MD;  Location: [facility] OR;  Service: Orthopedics;  Laterality: Left;  Left Total Knee Arthroplasty   . TUBAL LIGATION  1988    OB History    No data available       Home  Medications    Prior to Admission medications   Medication Sig Start Date End Date Taking? Authorizing Provider  amoxicillin-clavulanate (AUGMENTIN) 875-125 MG tablet Take 1 tablet by mouth every 12 (twelve) hours. 05/28/17 06/04/17  Evon Slack, PA-C  ARIPiprazole (ABILIFY) 2 MG tablet Take 1 tablet (2 mg total) by mouth daily. Patient not taking: Reported on 07/22/2016 12/04/15   Adonis Brook, NP  ARIPiprazole (ABILIFY) 5 MG tablet Take 5 mg by mouth daily.    [provider]  DULoxetine (CYMBALTA) 30 MG capsule Take 30 mg by mouth 2 (two) times daily.    [provider]  DULoxetine HCl 40 MG CPEP Take 40 mg by mouth daily. Patient not taking: Reported on 07/22/2016 12/04/15   Truman Hayward, FNP  metFORMIN (GLUCOPHAGE) 500 MG tablet Take 1 tablet (500 mg total) by mouth 2 (two) times daily with a meal. 07/22/16 07/22/17  Governor Rooks, MD  traZODone (DESYREL) 50 MG tablet Take 1 tablet (50 mg total) by mouth at bedtime as needed for sleep. Patient not taking: Reported on 07/22/2016 12/04/15   Adonis Brook, NP    Family History History reviewed. No pertinent family history.  Social History Social History  Substance Use Topics  . Smoking status: Former Smoker    Packs/day: 0.50    Years: 16.00    Types: Cigarettes    Quit date: 10/04/1995  .  Smokeless tobacco: Never Used  . Alcohol use No     Comment: denied all alcohol use     Allergies   Patient has no known allergies.   Review of Systems Review of Systems  Constitutional: Negative for fever.  Respiratory: Negative for cough and shortness of breath.   Cardiovascular: Negative for chest pain.  Musculoskeletal: Negative for arthralgias, joint swelling and myalgias.  Skin: Positive for wound.     Physical Exam Updated Vital Signs BP (!) 152/105   Pulse (!) 109   Temp 99.1 F (37.3 C) (Oral)   Resp 20   Ht 5\' 5"  (1.651 m)   Wt 136.1 kg (300 lb)   SpO2 95%   BMI 49.92 kg/m   Physical Exam    Constitutional: She is oriented to person, place, and time. She appears well-developed and well-nourished.  HENT:  Head: Normocephalic and atraumatic.  Right Ear: External ear normal.  Left Ear: External ear normal.  Mouth/Throat: Oropharynx is clear and moist.  Eyes: Pupils are equal, round, and reactive to light.  Neck: Normal range of motion.  Cardiovascular: Regular rhythm.   Pulmonary/Chest: Effort normal. No respiratory distress.  Musculoskeletal:  Examination of the left index finger, radial aspect of the distal phalanx shows a small puncture wound. There is no visible or palpable foreign body. She is able to maintain full active extension and flexion of the DIP and PIP joint. She is nontender to palpation. There is no warmth erythema or drainage.  Neurological: She is alert and oriented to person, place, and time.  Skin: No rash noted. No erythema.  Psychiatric: She has a normal mood and affect. Her behavior is normal. Thought content normal.     ED Treatments / Results  Labs (all labs ordered are listed, but only abnormal results are displayed) Labs Reviewed - No data to display  EKG  EKG Interpretation None       Radiology Dg Finger Index Left  Result Date: 05/28/2017 CLINICAL DATA:  Dog bite to the index finger. EXAM: LEFT INDEX FINGER 2+V COMPARISON:  None. FINDINGS: Negative for fracture or dislocation in the left index finger. Soft tissue irregularity is compatible with the dog bite. No evidence for a radiopaque foreign body. IMPRESSION: No acute bone abnormality to the left index finger. Electronically Signed   By: Richarda Overlie M.D.   On: 05/28/2017 17:56    Procedures Procedures (including critical care time)  Medications Ordered in ED Medications  bacitracin ointment (not administered)  amoxicillin-clavulanate (AUGMENTIN) 875-125 MG per tablet 1 tablet (1 tablet Oral Given 05/28/17 1739)  Tdap (BOOSTRIX) injection 0.5 mL (0.5 mLs Intramuscular Given 05/28/17  1740)     Initial Impression / Assessment and Plan / ED Course  I have reviewed the triage vital signs and the nursing notes.  Pertinent labs & imaging results that were available during my care of the patient were reviewed by me and considered in my medical decision making (see chart for details).     51 year old female with small puncture wound from doge to left index finger.patient's tetanus is updated today. She is placed on prophylactic antibiotics. Vaccinations are up-to-date. She'll follow with orthopedics for wound check. She will cleanse wound daily. She is educated on signs and symptoms returned the ED for.  Final Clinical Impressions(s) / ED Diagnoses   Final diagnoses:  Dog bite of index finger, initial encounter  Puncture wound of index finger, initial encounter    New Prescriptions New Prescriptions   AMOXICILLIN-CLAVULANATE (  AUGMENTIN) 875-125 MG TABLET    Take 1 tablet by mouth every 12 (twelve) hours.     Evon Slack, PA-C 05/28/17 Flossie Buffy    Phineas Semen, MD 05/28/17 [phone]

## 2017-05-28 NOTE — Discharge Instructions (Signed)
Please take antibiotics as prescribed,soak the finger and half peroxide half warm water 5 minutes daily. Follow-up with orthopedist for wound check.

## 2017-05-28 NOTE — ED Notes (Signed)
PT unsure when last tetanus shot was, but states she is about due for it.

## 2017-08-08 ENCOUNTER — Encounter (INDEPENDENT_AMBULATORY_CARE_PROVIDER_SITE_OTHER): Payer: Self-pay | Admitting: Orthopedic Surgery

## 2017-08-08 ENCOUNTER — Ambulatory Visit (INDEPENDENT_AMBULATORY_CARE_PROVIDER_SITE_OTHER): Payer: Self-pay | Admitting: Orthopedic Surgery

## 2017-08-08 VITALS — Ht 65.0 in | Wt 300.0 lb

## 2017-08-08 DIAGNOSIS — L989 Disorder of the skin and subcutaneous tissue, unspecified: Secondary | ICD-10-CM

## 2017-08-08 DIAGNOSIS — I87323 Chronic venous hypertension (idiopathic) with inflammation of bilateral lower extremity: Secondary | ICD-10-CM

## 2017-08-08 NOTE — Progress Notes (Signed)
Office Visit Note   Patient: Maria Hancock           Date of Birth: 02/27/1966           MRN: [id number] Visit Date: 08/08/2017              [facility]ed by: [facility] [address] [place], [place] [id number] PCP: [facility]  Chief Complaint  Patient presents with  . Left Leg - Wound Check  . Right Leg - Wound Check      HPI: Patient is a 51 year old woman who was seen for initial evaluation for both lower extremities.  Patient states that she has had a dermatologic rash for over a year that will not go away.  She states she is used different types of creams without relief.  She complains of large patches of scaly dry skin.  She states it looks like psoriasis.  She complains of pain denies any itching.  States that her legs are swollen she has been advised to use compression but has not tried them.  Past medical history positive for type 2 diabetes depression hypertension migraines anxiety and arthritis.  Patient states that her hemoglobin A1c most recently was 6.9.  She takes gabapentin 300 mg twice a day for hot flashes she is on Metformin for her diabetes lisinopril for her hypertension.  She is status post a left total knee arthroplasty.  Assessment & Plan: Visit Diagnoses:  1. Psoriasis-like skin disease   2. Idiopathic chronic venous hypertension of both lower extremities with inflammation     Plan: [facility] to obtain a pair of 15-20 medical compression stockings she will wear these daily we will follow-up in the office in 3 weeks.  She will wear the sock around the clock.  Follow-Up Instructions: Return in about 3 weeks (around 08/29/2017).   Ortho Exam  Patient is alert, oriented, no adenopathy, well-dressed, normal affect, normal respiratory effort. Examination patient has venous stasis swelling of both lower extremities there is no brawny skin color changes her calf  circumference is 56 cm bilaterally.  She has read flaky patches on both legs that look like psoriasis or could be a fungal dermatitis.  There is no central clearing pictures were obtained of both legs.  There is no cellulitis no drainage no signs of infection.  Imaging: No results found. No images are attached to the encounter.  Labs: Lab Results  Component Value Date   HGBA1C 7.3 (H) 12/04/2015   REPTSTATUS 02/20/2013 FINAL 02/19/2013   CULT NO GROWTH 02/19/2013    Orders:  No orders of the defined types were placed in this encounter.  No orders of the defined types were placed in this encounter.    Procedures: No procedures performed  Clinical Data: No additional findings.  ROS:  All other systems negative, except as noted in the HPI. Review of Systems  Objective: Vital Signs: Ht 5\' 5"  (1.651 m)   Wt 300 lb (136.1 kg)   BMI 49.92 kg/m   Specialty Comments:  No specialty comments available.  PMFS History: Patient Active Problem List   Diagnosis Date Noted  . Major depression, recurrent, chronic (HCC) 12/02/2015  . Osteoarthritis of left knee 02/26/2013    Class: Diagnosis of   Past Medical History:  Diagnosis Date  . Anxiety   . Arthritis    "knees, back, hands" (02/26/2013)  . Depression   . Diabetes mellitus without  complication (HCC)   . GERD (gastroesophageal reflux disease)    with Mobic  . IBS (irritable bowel syndrome)   . Menorrhagia   . Migraine headache     History reviewed. No pertinent family history.  Past Surgical History:  Procedure Laterality Date  . JOINT REPLACEMENT    . KNEE ARTHROPLASTY Left ?2001   "put hardware in" (02/26/2013)  . KNEE ARTHROSCOPY Left    "I've had 2; 2nd one was 2013" (02/26/2013)  . REPLACEMENT TOTAL KNEE Left 02/26/2013   "w/hardware removal" (02/26/2013)  . TUBAL LIGATION  1988   Social History   Occupational History  . Not on file  Tobacco Use  . Smoking status: Former Smoker    Packs/day: 0.50     Years: 16.00    Pack years: 8.00    Types: Cigarettes    Last attempt to quit: 10/04/1995    Years since quitting: 21.8  . Smokeless tobacco: Never Used  Substance and Sexual Activity  . Alcohol use: No    Comment: denied all alcohol use  . Drug use: No    Comment: denied all drug/alcohol/tobacco use  . Sexual activity: Yes    Birth control/protection: IUD    Comment: IUD in placed

## 2017-08-29 ENCOUNTER — Ambulatory Visit (INDEPENDENT_AMBULATORY_CARE_PROVIDER_SITE_OTHER): Payer: Self-pay | Admitting: Orthopedic Surgery

## 2018-01-25 ENCOUNTER — Ambulatory Visit: Payer: 59 | Admitting: Family Medicine

## 2018-01-25 ENCOUNTER — Encounter: Payer: Self-pay | Admitting: Family Medicine

## 2018-01-25 VITALS — BP 132/68 | HR 71 | Temp 98.6°F | Ht 65.0 in | Wt 294.0 lb

## 2018-01-25 DIAGNOSIS — M722 Plantar fascial fibromatosis: Secondary | ICD-10-CM

## 2018-01-25 NOTE — Progress Notes (Signed)
Maria Billetheresa A Bulkley - 52 y.o. female MRN [id number]  Date of birth: 11/30/1965  SUBJECTIVE:  Including CC & ROS.  Chief Complaint  Patient presents with  . left foot pain    Maria Hancock is a 52 y.o. female that is presenting with left foot pain. [facility] diagnosed with a bone spur and plantar fasciitis. Pain ongoing for three weeks. Pain is severe when walking.  Denies injury or surgeries.  Denies any injury.  Pain is been present for months.  Has gotten worse recently over the past 4 weeks.  Denies any bruising or swelling.  Has a stent on her feet all day long at work.  The pain is worse in the morning and after standing all day.  Pain is localized to the heel.   Review of Systems  Constitutional: Negative for fever.  HENT: Negative for congestion.   Respiratory: Negative for shortness of breath.   Cardiovascular: Negative for chest pain.  Gastrointestinal: Negative for abdominal pain.  Musculoskeletal: Positive for gait problem.  Skin: Negative for color change.  Allergic/Immunologic: Negative for immunocompromised state.  Neurological: Negative for weakness.  Hematological: Negative for adenopathy.  Psychiatric/Behavioral: Negative for agitation.    HISTORY: Past Medical, Surgical, Social, and Family History Reviewed & Updated per EMR.   Pertinent Historical Findings include:  Past Medical History:  Diagnosis Date  . Anxiety   . Arthritis    "knees, back, hands" (02/26/2013)  . Depression   . Diabetes mellitus without complication (HCC)   . GERD (gastroesophageal reflux disease)    with Mobic  . IBS (irritable bowel syndrome)   . Menorrhagia   . Migraine headache     Past Surgical History:  Procedure Laterality Date  . HARDWARE REMOVAL Left 02/26/2013   Procedure: HARDWARE REMOVAL;  Surgeon: Cammy CopaGregory Scott Dean, MD;  [facility];  Service: Orthopedics;  Laterality: Left;  Left Knee Removal of Hardware  . JOINT REPLACEMENT    .  KNEE ARTHROPLASTY Left ?2001   "put hardware in" (02/26/2013)  . KNEE ARTHROSCOPY Left    "I've had 2; 2nd one was 2013" (02/26/2013)  . REPLACEMENT TOTAL KNEE Left 02/26/2013   "w/hardware removal" (02/26/2013)  . TOTAL KNEE ARTHROPLASTY Left 02/26/2013   Procedure: TOTAL KNEE ARTHROPLASTY;  Surgeon: Cammy CopaGregory Scott Dean, MD;  [facility];  Service: Orthopedics;  Laterality: Left;  Left Total Knee Arthroplasty   . TUBAL LIGATION  1988    No Known Allergies  History reviewed. No pertinent family history.   Social History   Socioeconomic History  . Marital status: Married    Spouse name: Not on file  . Number of children: Not on file  . Years of education: Not on file  . Highest education level: Not on file  Occupational History  . Not on file  Social Needs  . Financial resource strain: Not on file  . Food insecurity:    Worry: Not on file    Inability: Not on file  . Transportation needs:    Medical: Not on file    Non-medical: Not on file  Tobacco Use  . Smoking status: Former Smoker    Packs/day: 0.50    Years: 16.00    Pack years: 8.00    Types: Cigarettes    Last attempt to quit: 10/04/1995    Years since quitting: 22.3  . Smokeless tobacco: Never Used  Substance and Sexual Activity  . Alcohol use: No  Comment: denied all alcohol use  . Drug use: No    Comment: denied all drug/alcohol/tobacco use  . Sexual activity: Yes    Birth control/protection: IUD    Comment: IUD in placed  Lifestyle  . Physical activity:    Days per week: Not on file    Minutes per session: Not on file  . Stress: Not on file  Relationships  . Social connections:    Talks on phone: Not on file    Gets together: Not on file    Attends religious service: Not on file    Active member of club or organization: Not on file    Attends meetings of clubs or organizations: Not on file    Relationship status: Not on file  . Intimate partner violence:    Fear of current or ex partner: Not  on file    Emotionally abused: Not on file    Physically abused: Not on file    Forced sexual activity: Not on file  Other Topics Concern  . Not on file  Social History Narrative  . Not on file     PHYSICAL EXAM:  VS: BP 132/68 (BP Location: Left Arm, Patient Position: Sitting, Cuff Size: Normal)   Pulse 71   Temp 98.6 F (37 C) (Oral)   Ht 5\' 5"  (1.651 m)   Wt 294 lb (133.4 kg)   SpO2 96%   BMI 48.92 kg/m  Physical Exam Gen: NAD, alert, cooperative with exam, well-appearing ENT: normal lips, normal nasal mucosa,  Eye: normal EOM, normal conjunctiva and lids CV:  no edema, +2 pedal pulses   Resp: no accessory muscle use, non-labored,  Skin: no rashes, no areas of induration  Neuro: normal tone, normal sensation to touch Psych:  normal insight, alert and oriented MSK:  Left foot:  Tenderness to palpation over the calcaneus. No ecchymosis or swelling. Normal range of motion of the ankle Callus formation over the medial hindfoot to suggest that she pronates Has a smaller foot Neurovascular intact   Aspiration/Injection Procedure Note Maria Hancock 1966-04-16  Procedure: Injection Indications: left heel pain  Procedure Details Consent: Risks of procedure as well as the alternatives and risks of each were explained to the (patient/caregiver).  Consent for procedure obtained. Time Out: Verified patient identification, verified procedure, site/side was marked, verified correct patient position, special equipment/implants available, medications/allergies/relevent history reviewed, required imaging and test results available.  Performed.  The area was cleaned with iodine and alcohol swabs.    The left plantar fascia was injected using 1 cc's of 40 mg Depomedrol and 2 cc's of 1% lidocaine with a 25 1 1/2" needle.  Ultrasound was used. Images were obtained in Transverse views showing the injection.    A sterile dressing was applied.  Patient did tolerate procedure  well.           ASSESSMENT & PLAN:   Plantar fasciitis of left foot Findings on exam and [place] suggest plantar fasciitis.  - injection today  - counseled on conservative care  - if no improvement consider PT and imaging.

## 2018-01-25 NOTE — Patient Instructions (Signed)
Please try to exercise on a regular basis. Please ice the bottom of your foot. Please follow-up with me in 4 to 6 weeks if your pain has not improved. You can consider getting custom orthotics and I make these at the [place] location.

## 2018-01-26 ENCOUNTER — Encounter: Payer: Self-pay | Admitting: Family Medicine

## 2018-01-26 DIAGNOSIS — M722 Plantar fascial fibromatosis: Secondary | ICD-10-CM | POA: Insufficient documentation

## 2018-01-26 HISTORY — DX: Plantar fascial fibromatosis: M72.2

## 2018-01-26 NOTE — Assessment & Plan Note (Signed)
Findings on exam and US suggest plantar fasciitis.  - injection today  - counseled on conservative care  - if no improvement consider PT and imaging.

## 2018-02-05 ENCOUNTER — Encounter: Payer: Self-pay | Admitting: Family Medicine

## 2018-02-05 ENCOUNTER — Ambulatory Visit: Payer: 59 | Admitting: Family Medicine

## 2018-02-05 VITALS — BP 134/68 | HR 68 | Ht 65.0 in | Wt 290.0 lb

## 2018-02-05 DIAGNOSIS — M722 Plantar fascial fibromatosis: Secondary | ICD-10-CM

## 2018-02-05 MED ORDER — MELOXICAM 15 MG PO TABS: 15.0000 mg | ORAL_TABLET | Freq: Every day | ORAL | 0 refills | Status: DC

## 2018-02-05 NOTE — Progress Notes (Signed)
Maria Hancock - 52 y.o. female MRN [id number]  Date of birth: 02-May-1966  SUBJECTIVE:  Including CC & ROS.  Chief Complaint  Patient presents with  . Left foot pain    Maria Hancock is a 52 y.o. female that is presenting with left foot pain.  She was seen on 01/25/18 for plantar fascitis and given an injection.  She had no improvement with injection. Has been taking motrin and aleve intermittently. Pain is worse at the end of the day after standing. She works as a Research scientist (medical) at [employer]. No injury or prior surgery. Pain is constant burning pain. She has been doing daily exercise that were provided at her last visit. Pain is localized.     Review of Systems  Constitutional: Negative for fever.  HENT: Negative for congestion.   Respiratory: Negative for cough.   Cardiovascular: Negative for chest pain.  Gastrointestinal: Negative for abdominal pain.  Musculoskeletal: Positive for gait problem.  Skin: Negative for color change.  Neurological: Negative for weakness.  Hematological: Negative for adenopathy.  Psychiatric/Behavioral: Negative for agitation.    HISTORY: Past Medical, Surgical, Social, and Family History Reviewed & Updated per EMR.   Pertinent Historical Findings include:  Past Medical History:  Diagnosis Date  . Anxiety   . Arthritis    "knees, back, hands" (02/26/2013)  . Depression   . Diabetes mellitus without complication (HCC)   . GERD (gastroesophageal reflux disease)    with Mobic  . IBS (irritable bowel syndrome)   . Menorrhagia   . Migraine headache     Past Surgical History:  Procedure Laterality Date  . HARDWARE REMOVAL Left 02/26/2013   Procedure: HARDWARE REMOVAL;  Surgeon: Cammy Copa, MD;  Location: [facility] OR;  Service: Orthopedics;  Laterality: Left;  Left Knee Removal of Hardware  . JOINT REPLACEMENT    . KNEE ARTHROPLASTY Left ?2001   "put hardware in" (02/26/2013)  . KNEE ARTHROSCOPY Left    "I've had 2; 2nd one was 2013"  (02/26/2013)  . REPLACEMENT TOTAL KNEE Left 02/26/2013   "w/hardware removal" (02/26/2013)  . TOTAL KNEE ARTHROPLASTY Left 02/26/2013   Procedure: TOTAL KNEE ARTHROPLASTY;  Surgeon: Cammy Copa, MD;  Location: [facility] OR;  Service: Orthopedics;  Laterality: Left;  Left Total Knee Arthroplasty   . TUBAL LIGATION  1988    No Known Allergies  No family history on file.   Social History   Socioeconomic History  . Marital status: Married    Spouse name: Not on file  . Number of children: Not on file  . Years of education: Not on file  . Highest education level: Not on file  Occupational History  . Not on file  Social Needs  . Financial resource strain: Not on file  . Food insecurity:    Worry: Not on file    Inability: Not on file  . Transportation needs:    Medical: Not on file    Non-medical: Not on file  Tobacco Use  . Smoking status: Former Smoker    Packs/day: 0.50    Years: 16.00    Pack years: 8.00    Types: Cigarettes    Last attempt to quit: 10/04/1995    Years since quitting: 22.3  . Smokeless tobacco: Never Used  Substance and Sexual Activity  . Alcohol use: No    Comment: denied all alcohol use  . Drug use: No    Comment: denied all drug/alcohol/tobacco use  . Sexual activity: Yes  Birth control/protection: IUD    Comment: IUD in placed  Lifestyle  . Physical activity:    Days per week: Not on file    Minutes per session: Not on file  . Stress: Not on file  Relationships  . Social connections:    Talks on phone: Not on file    Gets together: Not on file    Attends religious service: Not on file    Active member of club or organization: Not on file    Attends meetings of clubs or organizations: Not on file    Relationship status: Not on file  . Intimate partner violence:    Fear of current or ex partner: Not on file    Emotionally abused: Not on file    Physically abused: Not on file    Forced sexual activity: Not on file  Other Topics Concern    . Not on file  Social History Narrative  . Not on file     PHYSICAL EXAM:  VS: BP 134/68 (BP Location: Left Arm, Patient Position: Sitting, Cuff Size: Normal)   Pulse 68   Ht  (1.651 m)   Wt 290 lb (131.5 kg)   SpO2 98%   BMI 48.26 kg/m  Physical Exam Gen: NAD, alert, cooperative with exam, well-appearing ENT: normal lips, normal nasal mucosa,  Eye: normal EOM, normal conjunctiva and lids CV:  no edema, +2 pedal pulses   Resp: no accessory muscle use, non-labored,  Skin: no rashes, no areas of induration  Neuro: normal tone, normal sensation to touch Psych:  normal insight, alert and oriented MSK:  Left foot:  Callus formation on the medial aspect of the calcaneus  TTP at the plantar calcaneus  Normal ankle ROM  Normal strength to resistance  Neurovascularly intact.      ASSESSMENT & PLAN:   Plantar fasciitis of left foot No improvement with injection. Possible for fat pad syndrome. Unable to view xray but reports was done at [facility] and told she had a spur. If ongoing pain would then would obtain xray.  - try to obtain gel heel cups  - mobic  - referral to pt  - if no improvement consider MRI vs custom orthotic vs trial in CAM walker

## 2018-02-05 NOTE — Assessment & Plan Note (Addendum)
No improvement with injection. Possible for fat pad syndrome. Unable to view xray but reports was done at [facility] and told she had a spur. If ongoing pain would then would obtain xray.  - try to obtain gel heel cups  - mobic  - referral to pt  - if no improvement consider MRI vs custom orthotic vs trial in CAM walker

## 2018-02-05 NOTE — Patient Instructions (Signed)
Please take the mobic for 7 days straight and then as needed Please continue to ice the area  Please continue to stretch the lower leg  Please try the gel heel cups.  Please follow up with me in 4 weeks.

## 2018-04-16 ENCOUNTER — Emergency Department
Admission: EM | Admit: 2018-04-16 | Discharge: 2018-04-16 | Disposition: A | Discharge status: Home / Self Care | Payer: 59 | Attending: Student in an Organized Health Care Education/Training Program | Admitting: Student in an Organized Health Care Education/Training Program

## 2018-04-16 ENCOUNTER — Other Ambulatory Visit: Payer: Self-pay

## 2018-04-16 ENCOUNTER — Emergency Department: Payer: 59

## 2018-04-16 ENCOUNTER — Encounter: Payer: Self-pay | Admitting: Emergency Medicine

## 2018-04-16 DIAGNOSIS — F329 Major depressive disorder, single episode, unspecified: Secondary | ICD-10-CM | POA: Insufficient documentation

## 2018-04-16 DIAGNOSIS — Z79899 Other long term (current) drug therapy: Secondary | ICD-10-CM | POA: Diagnosis not present

## 2018-04-16 DIAGNOSIS — F419 Anxiety disorder, unspecified: Secondary | ICD-10-CM | POA: Diagnosis not present

## 2018-04-16 DIAGNOSIS — Z96652 Presence of left artificial knee joint: Secondary | ICD-10-CM | POA: Insufficient documentation

## 2018-04-16 DIAGNOSIS — Z87891 Personal history of nicotine dependence: Secondary | ICD-10-CM | POA: Insufficient documentation

## 2018-04-16 DIAGNOSIS — Z7984 Long term (current) use of oral hypoglycemic drugs: Secondary | ICD-10-CM | POA: Insufficient documentation

## 2018-04-16 DIAGNOSIS — E119 Type 2 diabetes mellitus without complications: Secondary | ICD-10-CM | POA: Insufficient documentation

## 2018-04-16 DIAGNOSIS — R079 Chest pain, unspecified: Secondary | ICD-10-CM | POA: Insufficient documentation

## 2018-04-16 LAB — BASIC METABOLIC PANEL
ANION GAP: 9 (ref 5–15)
BUN: 23 mg/dL — ABNORMAL HIGH (ref 6–20)
CALCIUM: 9.4 mg/dL (ref 8.9–10.3)
CO2: 27 mmol/L (ref 22–32)
Chloride: 101 mmol/L (ref 98–111)
Creatinine, Ser: 0.82 mg/dL (ref 0.44–1.00)
GFR calc non Af Amer: >60 mL/min (ref >60)
GLUCOSE: 125 mg/dL — AB (ref 70–99)
POTASSIUM: 4.1 mmol/L (ref 3.5–5.1)
Sodium: 137 mmol/L (ref 135–145)

## 2018-04-16 LAB — CBC
HEMATOCRIT: 40.2 % (ref 35.0–47.0)
HEMOGLOBIN: 14.1 g/dL (ref 12.0–16.0)
MCH: 33.2 pg (ref 26.0–34.0)
MCHC: 35.1 g/dL (ref 32.0–36.0)
MCV: 94.5 fL (ref 80.0–100.0)
Platelets: 269 10*3/uL (ref 150–440)
RBC: 4.26 MIL/uL (ref 3.80–5.20)
RDW: 14.6 % — ABNORMAL HIGH (ref 11.5–14.5)
WBC: 6.8 10*3/uL (ref 3.6–11.0)

## 2018-04-16 LAB — TROPONIN I

## 2018-04-16 LAB — FIBRIN DERIVATIVES D-DIMER (ARMC ONLY): FIBRIN DERIVATIVES D-DIMER (ARMC): 455.47 ng{FEU}/mL (ref 0.00–499.00)

## 2018-04-16 MED ORDER — HYDROCODONE-ACETAMINOPHEN 5-325 MG PO TABS
1.0000 | ORAL_TABLET | Freq: Once | ORAL | Status: AC
  Administered 2018-04-16: 1 via ORAL
  Filled 2018-04-16: qty 1

## 2018-04-16 NOTE — ED Provider Notes (Addendum)
[facility] Emergency Department Provider Note    First MD Initiated Contact with Patient 04/16/18 1336     (approximate)  I have reviewed the triage vital signs and the nursing notes.   HISTORY  Chief Complaint Chest Pain    HPI Maria Hancock is a 52 y.o. female with a history of GERD, anxiety, IBS presents to the ER with chief complaint of chest pain started around 9:00 this morning.  States it was sudden in onset while she was at work.  Is never had pain quite like this before.  Does hurt when she takes a deep inspiration.  She does not smoke.  No lower extremity swelling.  No recent fevers.  Does still have mild discomfort.  Pain does not radiate through to her back.  Denies any nausea or vomiting.    Past Medical History:  Diagnosis Date  . Anxiety   . Arthritis    "knees, back, hands" (02/26/2013)  . Depression   . Diabetes mellitus without complication (HCC)   . GERD (gastroesophageal reflux disease)    with Mobic  . IBS (irritable bowel syndrome)   . Menorrhagia   . Migraine headache    No family history on file. Past Surgical History:  Procedure Laterality Date  . HARDWARE REMOVAL Left 02/26/2013   Procedure: HARDWARE REMOVAL;  Surgeon: Cammy Copa, MD;  Location: [facility] OR;  Service: Orthopedics;  Laterality: Left;  Left Knee Removal of Hardware  . JOINT REPLACEMENT    . KNEE ARTHROPLASTY Left ?2001   "put hardware in" (02/26/2013)  . KNEE ARTHROSCOPY Left    "I've had 2; 2nd one was 2013" (02/26/2013)  . REPLACEMENT TOTAL KNEE Left 02/26/2013   "w/hardware removal" (02/26/2013)  . TOTAL KNEE ARTHROPLASTY Left 02/26/2013   Procedure: TOTAL KNEE ARTHROPLASTY;  Surgeon: Cammy Copa, MD;  Location: [facility] OR;  Service: Orthopedics;  Laterality: Left;  Left Total Knee Arthroplasty   . TUBAL LIGATION  1988   Patient Active Problem List   Diagnosis Date Noted  . Plantar fasciitis of left foot 01/26/2018  . Major depression,  recurrent, chronic (HCC) 12/02/2015  . Osteoarthritis of left knee 02/26/2013    Class: Diagnosis of      Prior to Admission medications   Medication Sig Start Date End Date Taking? Authorizing Provider  ARIPiprazole (ABILIFY) 5 MG tablet Take 5 mg by mouth daily.    [provider]  clonazePAM (KLONOPIN) 1 MG tablet TAKE 1/2 TO 1 TABLET BY MOUTH TWICE A DAY 01/28/18   [provider]  gabapentin (NEURONTIN) 300 MG capsule TAKE 1-2 CAPSULES BY MOUTH TWICE DAILY 01/17/18   [provider]  lisinopril (PRINIVIL,ZESTRIL) 20 MG tablet Take 20 mg by mouth 2 (two) times daily. 01/09/18   [provider]  meloxicam (MOBIC) 15 MG tablet Take 1 tablet (15 mg total) by mouth daily. 02/05/18   Myra Rude, MD  metFORMIN (GLUCOPHAGE) 500 MG tablet Take 500 mg by mouth 2 (two) times daily. 12/09/17   [provider]  rosuvastatin (CRESTOR) 10 MG tablet TAKE 1 TABLET BY MOUTH EVERYDAY AT BEDTIME 01/17/18   [provider]    Allergies Patient has no known allergies.    Social History Social History   Tobacco Use  . Smoking status: Former Smoker    Packs/day: 0.50    Years: 16.00    Pack years: 8.00    Types: Cigarettes    Last attempt to quit: 10/04/1995  Years since quitting: 22.5  . Smokeless tobacco: Never Used  Substance Use Topics  . Alcohol use: No    Comment: denied all alcohol use  . Drug use: No    Comment: denied all drug/alcohol/tobacco use    Review of Systems Patient denies headaches, rhinorrhea, blurry vision, numbness, shortness of breath, chest pain, edema, cough, abdominal pain, nausea, vomiting, diarrhea, dysuria, fevers, rashes or hallucinations unless otherwise stated above in HPI. ____________________________________________   PHYSICAL EXAM:  VITAL SIGNS: Vitals:   04/16/18 1325 04/16/18 1542  BP: 121/73 (!) 145/87  Pulse: 75 76  Resp: 16   Temp:    SpO2: 97% 100%    Constitutional: Alert and  oriented.  Eyes: Conjunctivae are normal.  Head: Atraumatic. Nose: No congestion/rhinnorhea. Mouth/Throat: Mucous membranes are moist.   Neck: No stridor. Painless ROM.  Cardiovascular: Normal rate, regular rhythm. Grossly normal heart sounds.  Good peripheral circulation. Respiratory: Normal respiratory effort.  No retractions. Lungs CTAB. Gastrointestinal: Soft and nontender. No distention. No abdominal bruits. No CVA tenderness. Genitourinary:  Musculoskeletal: No lower extremity tenderness nor edema.  No joint effusions. Neurologic:  Normal speech and language. No gross focal neurologic deficits are appreciated. No facial droop Skin:  Skin is warm, dry and intact. No rash noted. Psychiatric: Mood and affect are normal. Speech and behavior are normal.  ____________________________________________   LABS (all labs ordered are listed, but only abnormal results are displayed)  Results for orders placed or performed during the hospital encounter of 04/16/18 (from the past 24 hour(s))  Basic metabolic panel     Status: Abnormal   Collection Time: 04/16/18 12:23 PM  Result Value Ref Range   Sodium 137 135 - 145 mmol/L   Potassium 4.1 3.5 - 5.1 mmol/L   Chloride 101 98 - 111 mmol/L   CO2 27 22 - 32 mmol/L   Glucose, Bld 125 (H) 70 - 99 mg/dL   BUN 23 (H) 6 - 20 mg/dL   Creatinine, Ser 1.61 0.44 - 1.00 mg/dL   Calcium 9.4 8.9 - 09.6 mg/dL   GFR calc non Af Amer >60 >60 mL/min   GFR calc Af Amer >60 >60 mL/min   Anion gap 9 5 - 15  CBC     Status: Abnormal   Collection Time: 04/16/18 12:23 PM  Result Value Ref Range   WBC 6.8 3.6 - 11.0 K/uL   RBC 4.26 3.80 - 5.20 MIL/uL   Hemoglobin 14.1 12.0 - 16.0 g/dL   HCT 04.5 40.9 - 81.1 %   MCV 94.5 80.0 - 100.0 fL   MCH 33.2 26.0 - 34.0 pg   MCHC 35.1 32.0 - 36.0 g/dL   RDW 91.4 (H) 78.2 - 95.6 %   Platelets 269 150 - 440 K/uL  Troponin I     Status: None   Collection Time: 04/16/18 12:23 PM  Result Value Ref Range   Troponin I  <0.03 <0.03 ng/mL  Fibrin derivatives D-Dimer ([facility] only)     Status: None   Collection Time: 04/16/18  2:56 PM  Result Value Ref Range   Fibrin derivatives D-dimer ([facility]) 455.47 0.00 - 499.00 ng/mL (FEU)  Troponin I     Status: None   Collection Time: 04/16/18  3:30 PM  Result Value Ref Range   Troponin I <0.03 <0.03 ng/mL   ____________________________________________  EKG My review and personal interpretation at Time: chest pain   Indication: 80  Rate: sinus  Rhythm: normal Axis: normal Other: normal intervals, no stemi ____________________________________________  RADIOLOGY  I personally reviewed all radiographic images ordered to evaluate for the above acute complaints and reviewed radiology reports and findings.  These findings were personally discussed with the patient.  Please see medical record for radiology report.   ____________________________________________   PROCEDURES  Procedure(s) performed:  Procedures    Critical Care performed: no ____________________________________________   INITIAL IMPRESSION / ASSESSMENT AND PLAN / ED COURSE  Pertinent labs & imaging results that were available during my care of the patient were reviewed by me and considered in my medical decision making (see chart for details).   DDX: ACS, pericarditis, esophagitis, boerhaaves, pe, dissection, pna, bronchitis, costochondritis   Maria Hancock is a 52 y.o. who presents to the ED with was as described above.  EKG shows no evidence of acute ischemia.  Troponin is negative.  Seems atypical for ACS and patient's heart score is 2 versus 3 based on subjectivity further stratify with repeat enzyme.  Patient is low risk by Anner CreteWells but is on birth control therefore will order d-dimer to further risk stratify.  Patient will be signed out to oncoming physician pending repeat trop and d-dimer.  Clinical Course as of Apr 17 1631  Mon Apr 16, 2018  1504 Platelets: 269 [PR]  1632 Troponin  negative.  Dimer also negative.  Patient stable and appropriate for outpatient follow-up.   [PR]    Clinical Course User Index [PR] Willy Eddyobinson, Genevie Elman, MD     As part of my medical decision making, I reviewed the following data within the electronic MEDICAL RECORD NUMBER Nursing notes reviewed and incorporated, Labs reviewed, notes from prior ED visits.   ____________________________________________   FINAL CLINICAL IMPRESSION(S) / ED DIAGNOSES  Final diagnoses:  Chest pain, unspecified type      NEW MEDICATIONS STARTED DURING THIS VISIT:  New Prescriptions   No medications on file     Note:  This document was prepared using Dragon voice recognition software and may include unintentional dictation errors.    Willy Eddyobinson, Vianca Bracher, MD 04/16/18 1504    Willy Eddyobinson, Mack Thurmon, MD 04/16/18 [phone]

## 2018-04-16 NOTE — ED Triage Notes (Signed)
Patient presents to ED via [facility] from work with c/o chest pain. Patient reports sudden onset of central chest pain at 0900 this morning. Patient endorses nausea but denies vomiting. Patient also reports shortness of breath but is able to speak in full, complete sentences. Denies chest tenderness on palpation but does report pain is worse on inspiration. EMS gave 1 nitro spray which helped with patients pain. EMS also gave 324 of ASA.

## 2018-04-16 NOTE — ED Notes (Signed)
Called lab and spoke with [facility] and she said it was ok to send chart label since patient locked in sunquest

## 2018-05-23 ENCOUNTER — Emergency Department
Admission: EM | Admit: 2018-05-23 | Discharge: 2018-05-23 | Disposition: A | Discharge status: Home / Self Care | Payer: 59 | Attending: Student in an Organized Health Care Education/Training Program | Admitting: Student in an Organized Health Care Education/Training Program

## 2018-05-23 ENCOUNTER — Emergency Department: Payer: 59

## 2018-05-23 ENCOUNTER — Other Ambulatory Visit: Payer: Self-pay

## 2018-05-23 DIAGNOSIS — E119 Type 2 diabetes mellitus without complications: Secondary | ICD-10-CM | POA: Insufficient documentation

## 2018-05-23 DIAGNOSIS — Z79899 Other long term (current) drug therapy: Secondary | ICD-10-CM | POA: Insufficient documentation

## 2018-05-23 DIAGNOSIS — R42 Dizziness and giddiness: Secondary | ICD-10-CM | POA: Insufficient documentation

## 2018-05-23 DIAGNOSIS — Z96652 Presence of left artificial knee joint: Secondary | ICD-10-CM | POA: Diagnosis not present

## 2018-05-23 DIAGNOSIS — R0602 Shortness of breath: Secondary | ICD-10-CM

## 2018-05-23 DIAGNOSIS — Z7984 Long term (current) use of oral hypoglycemic drugs: Secondary | ICD-10-CM | POA: Diagnosis not present

## 2018-05-23 DIAGNOSIS — Z87891 Personal history of nicotine dependence: Secondary | ICD-10-CM | POA: Diagnosis not present

## 2018-05-23 DIAGNOSIS — R5383 Other fatigue: Secondary | ICD-10-CM | POA: Insufficient documentation

## 2018-05-23 LAB — BASIC METABOLIC PANEL
ANION GAP: 7 (ref 5–15)
BUN: 23 mg/dL — AB (ref 6–20)
CHLORIDE: 102 mmol/L (ref 98–111)
CO2: 29 mmol/L (ref 22–32)
Calcium: 9.4 mg/dL (ref 8.9–10.3)
Creatinine, Ser: 0.8 mg/dL (ref 0.44–1.00)
GFR calc Af Amer: >60 mL/min (ref >60)
GFR calc non Af Amer: >60 mL/min (ref >60)
GLUCOSE: 127 mg/dL — AB (ref 70–99)
POTASSIUM: 4.3 mmol/L (ref 3.5–5.1)
Sodium: 138 mmol/L (ref 135–145)

## 2018-05-23 LAB — CBC
HEMATOCRIT: 40.6 % (ref 35.0–47.0)
HEMOGLOBIN: 14.2 g/dL (ref 12.0–16.0)
MCH: 33.1 pg (ref 26.0–34.0)
MCHC: 35.1 g/dL (ref 32.0–36.0)
MCV: 94.4 fL (ref 80.0–100.0)
Platelets: 260 10*3/uL (ref 150–440)
RBC: 4.3 MIL/uL (ref 3.80–5.20)
RDW: 14.2 % (ref 11.5–14.5)
WBC: 6.7 10*3/uL (ref 3.6–11.0)

## 2018-05-23 LAB — TROPONIN I: Troponin I: <0.03 ng/mL (ref <0.03)

## 2018-05-23 LAB — FIBRIN DERIVATIVES D-DIMER (ARMC ONLY): Fibrin derivatives D-dimer (ARMC): 418.35 ng/mL (FEU) (ref 0.00–499.00)

## 2018-05-23 MED ORDER — SODIUM CHLORIDE 0.9 % IV BOLUS
500.0000 mL | Freq: Once | INTRAVENOUS | Status: AC
  Administered 2018-05-23: 500 mL via INTRAVENOUS

## 2018-05-23 NOTE — ED Notes (Signed)
Pt informed that we are awaiting the results of the troponin for her to be discharged

## 2018-05-23 NOTE — ED Provider Notes (Signed)
[facility] Emergency Department Provider Note    First MD Initiated Contact with Patient 05/23/18 1404     (approximate)  I have reviewed the triage vital signs and the nursing notes.   HISTORY  Chief Complaint Shortness of Breath and Dizziness    HPI Maria Hancock is a 52 y.o. female history of anxiety depression migraines presents the ER chief complaint of shortness of breath and fatigue and lightheadedness that started this morning after the patient was grooming her dog.  Denies any chest pain.  No pain with deep taking deep inspiration.  Did have brief episode of nausea but no vomiting.  Does feel lightheaded.  Denies any lower extremity swelling.  She was seen at urgent care and directed to the ER.    Past Medical History:  Diagnosis Date  . Anxiety   . Arthritis    "knees, back, hands" (02/26/2013)  . Depression   . Diabetes mellitus without complication (HCC)   . GERD (gastroesophageal reflux disease)    with Mobic  . IBS (irritable bowel syndrome)   . Menorrhagia   . Migraine headache    No family history on file. Past Surgical History:  Procedure Laterality Date  . HARDWARE REMOVAL Left 02/26/2013   Procedure: HARDWARE REMOVAL;  Surgeon: Cammy Copa, MD;  Location: [facility] OR;  Service: Orthopedics;  Laterality: Left;  Left Knee Removal of Hardware  . JOINT REPLACEMENT    . KNEE ARTHROPLASTY Left ?2001   "put hardware in" (02/26/2013)  . KNEE ARTHROSCOPY Left    "I've had 2; 2nd one was 2013" (02/26/2013)  . REPLACEMENT TOTAL KNEE Left 02/26/2013   "w/hardware removal" (02/26/2013)  . TOTAL KNEE ARTHROPLASTY Left 02/26/2013   Procedure: TOTAL KNEE ARTHROPLASTY;  Surgeon: Cammy Copa, MD;  Location: [facility] OR;  Service: Orthopedics;  Laterality: Left;  Left Total Knee Arthroplasty   . TUBAL LIGATION  1988   Patient Active Problem List   Diagnosis Date Noted  . Plantar fasciitis of left foot 01/26/2018  . Major depression,  recurrent, chronic (HCC) 12/02/2015  . Osteoarthritis of left knee 02/26/2013    Class: Diagnosis of      Prior to Admission medications   Medication Sig Start Date End Date Taking? Authorizing Provider  ARIPiprazole (ABILIFY) 5 MG tablet Take 5 mg by mouth daily.    [provider]  clonazePAM (KLONOPIN) 1 MG tablet TAKE 1/2 TO 1 TABLET BY MOUTH TWICE A DAY 01/28/18   [provider]  gabapentin (NEURONTIN) 300 MG capsule TAKE 1-2 CAPSULES BY MOUTH TWICE DAILY 01/17/18   [provider]  lisinopril (PRINIVIL,ZESTRIL) 20 MG tablet Take 20 mg by mouth 2 (two) times daily. 01/09/18   [provider]  meloxicam (MOBIC) 15 MG tablet Take 1 tablet (15 mg total) by mouth daily. 02/05/18   Myra Rude, MD  metFORMIN (GLUCOPHAGE) 500 MG tablet Take 500 mg by mouth 2 (two) times daily. 12/09/17   [provider]  rosuvastatin (CRESTOR) 10 MG tablet TAKE 1 TABLET BY MOUTH EVERYDAY AT BEDTIME 01/17/18   [provider]    Allergies Patient has no known allergies.    Social History Social History   Tobacco Use  . Smoking status: Former Smoker    Packs/day: 0.50    Years: 16.00    Pack years: 8.00    Types: Cigarettes    Last attempt to quit: 10/04/1995    Years since quitting: 22.6  . Smokeless tobacco:  Never Used  Substance Use Topics  . Alcohol use: No    Comment: denied all alcohol use  . Drug use: No    Comment: denied all drug/alcohol/tobacco use    Review of Systems Patient denies headaches, rhinorrhea, blurry vision, numbness, shortness of breath, chest pain, edema, cough, abdominal pain, nausea, vomiting, diarrhea, dysuria, fevers, rashes or hallucinations unless otherwise stated above in HPI. ____________________________________________   PHYSICAL EXAM:  VITAL SIGNS: Vitals:   05/23/18 1148  BP: (!) 142/92  Pulse: 69  Resp: 16  Temp: 98.2 F (36.8 C)  SpO2: 100%    Constitutional: Alert and oriented.  Eyes:  Conjunctivae are normal.  Head: Atraumatic. Nose: No congestion/rhinnorhea. Mouth/Throat: Mucous membranes are moist.   Neck: No stridor. Painless ROM.  Cardiovascular: Normal rate, regular rhythm. Grossly normal heart sounds.  Good peripheral circulation. Respiratory: Normal respiratory effort.  No retractions. Lungs CTAB. Gastrointestinal: Soft and nontender. No distention. No abdominal bruits. No CVA tenderness. Genitourinary:  Musculoskeletal: No lower extremity tenderness nor edema.  No joint effusions. Neurologic:  Normal speech and language. No gross focal neurologic deficits are appreciated. No facial droop Skin:  Skin is warm, dry and intact. No rash noted. Psychiatric: Mood and affect are normal. Speech and behavior are normal.  ____________________________________________   LABS (all labs ordered are listed, but only abnormal results are displayed)  Results for orders placed or performed during the hospital encounter of 05/23/18 (from the past 24 hour(s))  Basic metabolic panel     Status: Abnormal   Collection Time: 05/23/18 11:50 AM  Result Value Ref Range   Sodium 138 135 - 145 mmol/L   Potassium 4.3 3.5 - 5.1 mmol/L   Chloride 102 98 - 111 mmol/L   CO2 29 22 - 32 mmol/L   Glucose, Bld 127 (H) 70 - 99 mg/dL   BUN 23 (H) 6 - 20 mg/dL   Creatinine, Ser 1.320.80 0.44 - 1.00 mg/dL   Calcium 9.4 8.9 - 44.010.3 mg/dL   GFR calc non Af Amer >60 >60 mL/min   GFR calc Af Amer >60 >60 mL/min   Anion gap 7 5 - 15  CBC     Status: None   Collection Time: 05/23/18 11:50 AM  Result Value Ref Range   WBC 6.7 3.6 - 11.0 K/uL   RBC 4.30 3.80 - 5.20 MIL/uL   Hemoglobin 14.2 12.0 - 16.0 g/dL   HCT 10.240.6 72.535.0 - 36.647.0 %   MCV 94.4 80.0 - 100.0 fL   MCH 33.1 26.0 - 34.0 pg   MCHC 35.1 32.0 - 36.0 g/dL   RDW 44.014.2 34.711.5 - 42.514.5 %   Platelets 260 150 - 440 K/uL  Troponin I     Status: None   Collection Time: 05/23/18 11:50 AM  Result Value Ref Range   Troponin I <0.03 <0.03 ng/mL  Fibrin  derivatives D-Dimer ([facility] only)     Status: None   Collection Time: 05/23/18 11:50 AM  Result Value Ref Range   Fibrin derivatives D-dimer ([facility]) 418.35 0.00 - 499.00 ng/mL (FEU)  Troponin I     Status: None   Collection Time: 05/23/18  2:53 PM  Result Value Ref Range   Troponin I <0.03 <0.03 ng/mL   ____________________________________________  EKG My review and personal interpretation at Time: 11:50   Indication: sob  Rate: 70  Rhythm: sinus Axis: normal Other: normal intervals, no stemi ____________________________________________  RADIOLOGY  I personally reviewed all radiographic images ordered to evaluate for the above  acute complaints and reviewed radiology reports and findings.  These findings were personally discussed with the patient.  Please see medical record for radiology report.  ____________________________________________   PROCEDURES  Procedure(s) performed:  Procedures    Critical Care performed: no ____________________________________________   INITIAL IMPRESSION / ASSESSMENT AND PLAN / ED COURSE  Pertinent labs & imaging results that were available during my care of the patient were reviewed by me and considered in my medical decision making (see chart for details).   DDX: Asthma, copd, CHF, pna, ptx, malignancy, Pe, anemia   Dena Billetheresa A Larkin is a 52 y.o. who presents to the ED with symptoms as described above.  Patient is nontoxic she is afebrile Heema dynamically stable.  Exam is reassuring.  EKG shows no evidence of ischemia.  Based on her age she is low risk by Wells criteria but I will be PERC therefore will order d-dimer to further evaluate.  We will provide IV fluids as she does have a mildly elevated BUN symptoms may be secondary to dehydration.  Certainly no evidence of anemia.  The patient will be placed on continuous pulse oximetry and telemetry for monitoring.  Laboratory evaluation will be sent to evaluate for the above complaints.      Clinical Course as of May 24 1543  Wed May 23, 2018  1543 She feels better after IV fluids.  D-dimer is negative.  Repeat troponin negative.  Do feel patient stable and appropriate for outpatient follow-up.   [PR]    Clinical Course User Index [PR] Willy Eddyobinson, Anayeli Arel, MD     As part of my medical decision making, I reviewed the following data within the electronic MEDICAL RECORD NUMBER Nursing notes reviewed and incorporated, Labs reviewed, notes from prior ED visits and [place] Controlled Substance Database   ____________________________________________   FINAL CLINICAL IMPRESSION(S) / ED DIAGNOSES  Final diagnoses:  Lightheadedness  Shortness of breath      NEW MEDICATIONS STARTED DURING THIS VISIT:  New Prescriptions   No medications on file     Note:  This document was prepared using Dragon voice recognition software and may include unintentional dictation errors.    Willy Eddyobinson, Adilene Areola, MD 05/23/18 1544

## 2018-05-23 NOTE — ED Triage Notes (Addendum)
Pt arrives with POV from home c/o dizziness upon standing and SOB that started at 0830AM today. [facility]. Pt states that [facility] concerned for blood clot. Pt alert and oriented X4, active, cooperative, pt in NAD. RR even and unlabored, color WNL.    CBG 134 PTA.

## 2018-05-29 NOTE — Progress Notes (Signed)
New Outpatient Visit Date: 05/30/2018  Referring Provider: Willy Eddy, MD [facility] Emergency Department  Chief Complaint: Chest pain, shortness of breath, palpitations, and lightheadedness  HPI:  Maria Hancock is a 52 y.o. female who is being seen today for the evaluation of chest pain at the request of Dr. Roxan Hockey. She has a history of type 2 diabetes mellitus, GERD, anxiety, psoriasis, and IBS. She presented to the [facility] ED on 04/16/18 with acute onset of chest pain with deep inspiration.  Workup was unrevealing, including negative d-dimer and troponin I x 2.  She returned to the ED a week ago (05/23/18) complaining of shortness of breath and dizziness.  Workup was again unrevealing.  Today, the patient reports that she has continued to have almost constant chest pain and shortness of breath.  She also has frequent lightheadedness that is not positional or related to specific activity.  She is unable to describe her chest pain other than "it just hurts" and is localized to the left side of her chest.  It occasionally radiates to the left arm.  Maximal intensity is 6/10.  It is currently 4/10 and has been present for at least 2 to 3 months.  She endorses associated shortness of breath, nausea, and diaphoresis.  It seems to be worsened somewhat with activity.  She reports feeling lightheaded to the point of almost passing out though she has never passed out completely.  There are no obvious precipitants for these symptoms.  She notes that her fluoxetine was adjusted about 2 weeks ago and levothyroxine also added.  However, her symptoms predate these medication changes.  Maria Hancock denies prior cardiac work-up.  She has chronic swelling of the lower extremities as well as 2 pillow orthopnea.  She also endorses occasional PND.  She is typically fatigued when she awakens in the morning and has also been told that she snores.  She has never been evaluated for sleep apnea.  She has done some modest  exercises in the past, but she has not done anything for at least a month out of concern for these aforementioned symptoms.  --------------------------------------------------------------------------------------------------  Cardiovascular History & Procedures: Cardiovascular Problems:  Atypical chest pain  Shortness of breath  Palpitations  Lightheadedness  Risk Factors:  Diabetes mellitus, hypertension, and obesity  Cath/PCI:  None  CV Surgery:  None  EP Procedures and Devices:  None  Non-Invasive Evaluation(s):  None  Recent CV Pertinent Labs: Lab Results  Component Value Date   INR 1.92 (H) 03/02/2013   K 4.3 05/23/2018   BUN 23 (H) 05/23/2018   CREATININE 0.80 05/23/2018    --------------------------------------------------------------------------------------------------  Past Medical History:  Diagnosis Date  . Anxiety   . Arthritis    "knees, back, hands" (02/26/2013)  . Depression   . Diabetes mellitus without complication (HCC)   . GERD (gastroesophageal reflux disease)    with Mobic  . Hyperlipidemia   . Hypertension   . IBS (irritable bowel syndrome)   . Menorrhagia   . Migraine headache     Past Surgical History:  Procedure Laterality Date  . HARDWARE REMOVAL Left 02/26/2013   Procedure: HARDWARE REMOVAL;  Surgeon: Cammy Copa, MD;  Location: [facility] OR;  Service: Orthopedics;  Laterality: Left;  Left Knee Removal of Hardware  . JOINT REPLACEMENT    . KNEE ARTHROPLASTY Left ?2001   "put hardware in" (02/26/2013)  . KNEE ARTHROSCOPY Left    "I've had 2; 2nd one was 2013" (02/26/2013)  . REPLACEMENT TOTAL KNEE Left  02/26/2013   "w/hardware removal" (02/26/2013)  . TOTAL KNEE ARTHROPLASTY Left 02/26/2013   Procedure: TOTAL KNEE ARTHROPLASTY;  Surgeon: Cammy CopaGregory Scott Dean, MD;  [facility];  Service: Orthopedics;  Laterality: Left;  Left Total Knee Arthroplasty   . TUBAL LIGATION  1988    Current Meds  Medication Sig  .  ARIPiprazole (ABILIFY) 5 MG tablet Take 5 mg by mouth daily.  . clonazePAM (KLONOPIN) 1 MG tablet TAKE 1/2 TO 1 TABLET BY MOUTH TWICE A DAY  . gabapentin (NEURONTIN) 300 MG capsule TAKE 1-2 CAPSULES BY MOUTH TWICE DAILY  . levothyroxine (SYNTHROID, LEVOTHROID) 25 MCG tablet Take 25 mcg by mouth daily.  Marland Kitchen. lisinopril (PRINIVIL,ZESTRIL) 20 MG tablet Take 20 mg by mouth daily.   . metFORMIN (GLUCOPHAGE) 500 MG tablet Take 500 mg by mouth 2 (two) times daily.  Marland Kitchen. oxybutynin (DITROPAN) 5 MG tablet TAKE 1 TABLET BY MOUTH IN THE MORNING AND 2 TABLETS AT BEDTIME    Allergies: Patient has no known allergies.  Social History   Tobacco Use  . Smoking status: Former Smoker    Packs/day: 0.50    Years: 16.00    Pack years: 8.00    Types: Cigarettes    Last attempt to quit: 10/04/1995    Years since quitting: 22.6  . Smokeless tobacco: Never Used  Substance Use Topics  . Alcohol use: No  . Drug use: No    Comment: denied all drug/alcohol/tobacco use    Family History  Problem Relation Age of Onset  . Cirrhosis Father   . Heart disease Maternal Grandmother     Review of Systems: A 12-system review of systems was performed and was negative except as noted in the HPI.  --------------------------------------------------------------------------------------------------  Physical Exam: BP 128/88 (BP Location: Right Arm, Patient Position: Sitting, Cuff Size: Large)   Pulse 73   Ht 5\' 5"  (1.651 m)   Wt (!) 302 lb 12 oz (137.3 kg)   SpO2 98%   BMI 50.38 kg/m   General: Morbidly obese woman seated on the exam table.  She is accompanied by her son. HEENT: No conjunctival pallor or scleral icterus. Moist mucous membranes. OP clear. Neck: Supple without lymphadenopathy, thyromegaly, JVD, or HJR, though evaluation is limited by body habitus. No carotid bruit. Lungs: Normal work of breathing. Clear to auscultation bilaterally without wheezes or crackles. Heart: Regular rate and rhythm without  murmurs, rubs, or gallops.  Unable to assess PMI due to body habitus. Abd: Bowel sounds present. Soft, NT/ND.  Unable to assess hepatosplenomegaly due to body habitus. Ext: No lower extremity edema. Radial, PT, and DP pulses are 2+ bilaterally Skin: Warm and dry without rash. Neuro: CNIII-XII intact. Strength and fine-touch sensation intact in upper and lower extremities bilaterally. Psych: Anxious mood.  Affect is flat.  EKG: Normal sinus rhythm without abnormalities.  Lab Results  Component Value Date   WBC 6.7 05/23/2018   HGB 14.2 05/23/2018   HCT 40.6 05/23/2018   MCV 94.4 05/23/2018   PLT 260 05/23/2018    Lab Results  Component Value Date   NA 138 05/23/2018   K 4.3 05/23/2018   CL 102 05/23/2018   CO2 29 05/23/2018   BUN 23 (H) 05/23/2018   CREATININE 0.80 05/23/2018   GLUCOSE 127 (H) 05/23/2018   ALT 33 07/22/2016    No results found for: CHOL, HDL, LDLCALC, LDLDIRECT, TRIG, CHOLHDL   --------------------------------------------------------------------------------------------------  ASSESSMENT AND PLAN: Atypical chest pain, shortness of breath, palpitations, and lightheadedness Symptoms have been  present for at least 2 to 3 months and are always present with waxing and waning intensity.  She has been to the ER twice for chest pain and lightheadedness, with neither visit revealing any significant abnormalities.  Physical exam today is unremarkable.  EKG is also normal.  Overall, my suspicion for obstructive coronary artery disease is low, though the patient does have several risk factors (diabetes, hypertension, hyperlipidemia, and obesity).  Her morbid obesity makes ischemia evaluation somewhat challenging.  However, I would like to defer cardiac catheterization, if possible, as I do not think the benefits justify the risk.  We have agreed to obtain a transthoracic echocardiogram and an exercise myocardial perfusion stress test (can be converted to regadenoson if the  patient is unable to adequately exercise).  I will defer event monitoring for now.  I recommend that the patient start aspirin 81 mg daily pending results of the aforementioned testing.  Morbid obesity Weight loss encouraged.  If echo and stress test are unrevealing, the patient would benefit from going back to a regular exercise regimen.  Follow-up: Return to clinic in 1 month.  Yvonne Kendall, MD 05/30/2018 9:48 AM

## 2018-05-30 ENCOUNTER — Encounter: Payer: Self-pay | Admitting: Internal Medicine

## 2018-05-30 ENCOUNTER — Ambulatory Visit: Payer: 59 | Admitting: Internal Medicine

## 2018-05-30 ENCOUNTER — Encounter

## 2018-05-30 VITALS — BP 128/88 | HR 73 | Ht 65.0 in | Wt 302.8 lb

## 2018-05-30 DIAGNOSIS — R0789 Other chest pain: Secondary | ICD-10-CM

## 2018-05-30 DIAGNOSIS — R002 Palpitations: Secondary | ICD-10-CM

## 2018-05-30 DIAGNOSIS — R42 Dizziness and giddiness: Secondary | ICD-10-CM | POA: Diagnosis not present

## 2018-05-30 DIAGNOSIS — R0602 Shortness of breath: Secondary | ICD-10-CM | POA: Diagnosis not present

## 2018-05-30 MED ORDER — ASPIRIN EC 81 MG PO TBEC: 81.0000 mg | DELAYED_RELEASE_TABLET | Freq: Every day | ORAL | 3 refills | Status: DC

## 2018-05-30 NOTE — Patient Instructions (Addendum)
Medication Instructions:  Your physician has recommended you make the following change in your medication:  1- START Aspirin 81 mg by mouth once a day.   If chest pain returns, please proceed to the Emergency room or call 911.   Labwork: none  Testing/Procedures: Your physician has requested that you have an echocardiogram. Echocardiography is a painless test that uses sound waves to create images of your heart. It provides your doctor with information about the size and shape of your heart and how well your heart's chambers and valves are working. This procedure takes approximately one hour. There are no restrictions for this procedure. You may get an IV, if needed, to receive an ultrasound enhancing agent through to better visualize your heart.    Your physician has requested that you have en exercise stress myoview. For further information please visit [email or web address] Please follow instruction sheet, as given.  [facility] MYOVIEW  Your caregiver has ordered a Stress Test with nuclear imaging. The purpose of this test is to evaluate the blood supply to your heart muscle. This procedure is referred to as a "Non-Invasive Stress Test." This is because other than having an IV started in your vein, nothing is inserted or "invades" your body. Cardiac stress tests are done to find areas of poor blood flow to the heart by determining the extent of coronary artery disease (CAD). Some patients exercise on a treadmill, which naturally increases the blood flow to your heart, while others who are  unable to walk on a treadmill due to physical limitations have a pharmacologic/chemical stress agent called Lexiscan . This medicine will mimic walking on a treadmill by temporarily increasing your coronary blood flow.   Please note: these test may take anywhere between 2-4 hours to complete  PLEASE REPORT TO [facility]ICAL [facility] ENTRANCE  THE VOLUNTEERS AT THE FIRST DESK WILL DIRECT YOU WHERE TO GO  Date of  Procedure:_____________________________________  Arrival Time for Procedure:______________________________  Instructions regarding medication:   _X_ : Hold diabetes medication (METFORMIN) THE NIGHT BEFORE AND morning of procedure   PLEASE NOTIFY THE OFFICE AT LEAST 24 HOURS IN ADVANCE IF YOU ARE UNABLE TO KEEP YOUR APPOINTMENT.  [phone] AND  PLEASE NOTIFY NUCLEAR MEDICINE AT [facility] AT LEAST 24 HOURS IN ADVANCE IF YOU ARE UNABLE TO KEEP YOUR APPOINTMENT. [phone]  How to prepare for your Myoview test:  1. Do not eat or drink after midnight 2. No caffeine for 24 hours prior to test 3. No smoking 24 hours prior to test. 4. Your medication may be taken with water.  If your doctor stopped a medication because of this test, do not take that medication. 5. Ladies, please do not wear dresses.  Skirts or pants are appropriate. Please wear a short sleeve shirt. 6. No perfume, cologne or lotion. 7. Wear comfortable walking shoes. No heels!   Follow-Up: Your physician recommends that you schedule a follow-up appointment in: 1 MONTH WITH DR END OR APP.   Cardiac Nuclear Scan A cardiac nuclear scan is a test that measures blood flow to the heart when a person is resting and when he or she is exercising. The test looks for problems such as:  Not enough blood reaching a portion of the heart.  The heart muscle not working normally.  You may need this test if:  You have heart disease.  You have had abnormal lab results.  You have had heart surgery or angioplasty.  You have chest pain.  You have shortness of breath.  In this test, a radioactive dye (tracer) is injected into your bloodstream. After the tracer has traveled to your heart, an imaging device is used to measure how much of the tracer is absorbed by or distributed to various areas of your heart. This procedure is usually done at a hospital and takes 2-4 hours. Tell a health care provider about:  Any allergies you  have.  All medicines you are taking, including vitamins, herbs, eye drops, creams, and over-the-counter medicines.  Any problems you or family members have had with the use of anesthetic medicines.  Any blood disorders you have.  Any surgeries you have had.  Any medical conditions you have.  Whether you are pregnant or may be pregnant. What are the risks? Generally, this is a safe procedure. However, problems may occur, including:  Serious chest pain and heart attack. This is only a risk if the stress portion of the test is done.  Rapid heartbeat.  Sensation of warmth in your chest. This usually passes quickly.  What happens before the procedure?  Ask your health care provider about changing or stopping your regular medicines. This is especially important if you are taking diabetes medicines or blood thinners.  Remove your jewelry on the day of the procedure. What happens during the procedure?  An IV tube will be inserted into one of your veins.  Your health care provider will inject a small amount of radioactive tracer through the tube.  You will wait for 20-40 minutes while the tracer travels through your bloodstream.  Your heart activity will be monitored with an electrocardiogram (ECG).  You will lie down on an exam table.  Images of your heart will be taken for about 15-20 minutes.  You may be asked to exercise on a treadmill or stationary bike. While you exercise, your heart's activity will be monitored with an ECG, and your blood pressure will be checked. If you are unable to exercise, you may be given a medicine to increase blood flow to parts of your heart.  When blood flow to your heart has peaked, a tracer will again be injected through the IV tube.  After 20-40 minutes, you will get back on the exam table and have more images taken of your heart.  When the procedure is over, your IV tube will be removed. The procedure may vary among health care providers  and hospitals. Depending on the type of tracer used, scans may need to be repeated 3-4 hours later. What happens after the procedure?  Unless your health care provider tells you otherwise, you may return to your normal schedule, including diet, activities, and medicines.  Unless your health care provider tells you otherwise, you may increase your fluid intake. This will help flush the contrast dye from your body. Drink enough fluid to keep your urine clear or pale yellow.  It is up to you to get your test results. Ask your health care provider, or the department that is doing the test, when your results will be ready. Summary  A cardiac nuclear scan measures the blood flow to the heart when a person is resting and when he or she is exercising.  You may need this test if you are at risk for heart disease.  Tell your health care provider if you are pregnant.  Unless your health care provider tells you otherwise, increase your fluid intake. This will help flush the contrast dye from your body. Drink enough fluid to keep your urine clear or pale  yellow. This information is not intended to replace advice given to you by your health care provider. Make sure you discuss any questions you have with your health care provider. Document Released: 10/14/2004 Document Revised: 09/21/2016 Document Reviewed: 08/28/2013 Elsevier Interactive Patient Education  2017 [employer] Inc.    Echocardiogram An echocardiogram, or echocardiography, uses sound waves (ultrasound) to produce an image of your heart. The echocardiogram is simple, painless, obtained within a short period of time, and offers valuable information to your health care provider. The images from an echocardiogram can provide information such as:  Evidence of coronary artery disease (CAD).  Heart size.  Heart muscle function.  Heart valve function.  Aneurysm detection.  Evidence of a past heart attack.  Fluid buildup around the  heart.  Heart muscle thickening.  Assess heart valve function.  Tell a health care provider about:  Any allergies you have.  All medicines you are taking, including vitamins, herbs, eye drops, creams, and over-the-counter medicines.  Any problems you or family members have had with anesthetic medicines.  Any blood disorders you have.  Any surgeries you have had.  Any medical conditions you have.  Whether you are pregnant or may be pregnant. What happens before the procedure? No special preparation is needed. Eat and drink normally. What happens during the procedure?  In order to produce an image of your heart, gel will be applied to your chest and a wand-like tool (transducer) will be moved over your chest. The gel will help transmit the sound waves from the transducer. The sound waves will harmlessly bounce off your heart to allow the heart images to be captured in real-time motion. These images will then be recorded.  You may need an IV to receive a medicine that improves the quality of the pictures. What happens after the procedure? You may return to your normal schedule including diet, activities, and medicines, unless your health care provider tells you otherwise. This information is not intended to replace advice given to you by your health care provider. Make sure you discuss any questions you have with your health care provider. Document Released: 09/16/2000 Document Revised: 05/07/2016 Document Reviewed: 05/27/2013 Elsevier Interactive Patient Education  2017 [employer]

## 2018-05-31 ENCOUNTER — Encounter: Payer: Self-pay | Admitting: Internal Medicine

## 2018-05-31 DIAGNOSIS — R0602 Shortness of breath: Secondary | ICD-10-CM

## 2018-05-31 DIAGNOSIS — R0789 Other chest pain: Secondary | ICD-10-CM | POA: Insufficient documentation

## 2018-05-31 DIAGNOSIS — R002 Palpitations: Secondary | ICD-10-CM

## 2018-05-31 DIAGNOSIS — R42 Dizziness and giddiness: Secondary | ICD-10-CM | POA: Insufficient documentation

## 2018-05-31 HISTORY — DX: Palpitations: R00.2

## 2018-05-31 HISTORY — DX: Dizziness and giddiness: R42

## 2018-05-31 HISTORY — DX: Other chest pain: R07.89

## 2018-05-31 HISTORY — DX: Shortness of breath: R06.02

## 2018-06-08 ENCOUNTER — Ambulatory Visit (INDEPENDENT_AMBULATORY_CARE_PROVIDER_SITE_OTHER): Payer: 59

## 2018-06-08 ENCOUNTER — Other Ambulatory Visit: Payer: Self-pay

## 2018-06-08 DIAGNOSIS — R0789 Other chest pain: Secondary | ICD-10-CM

## 2018-06-08 DIAGNOSIS — R0602 Shortness of breath: Secondary | ICD-10-CM

## 2018-06-08 DIAGNOSIS — R42 Dizziness and giddiness: Secondary | ICD-10-CM | POA: Diagnosis not present

## 2018-06-11 ENCOUNTER — Telehealth: Payer: Self-pay | Admitting: Internal Medicine

## 2018-06-11 NOTE — Telephone Encounter (Signed)
Patient returning our call for Echo results ° °Please call back ° °

## 2018-06-11 NOTE — Telephone Encounter (Signed)
Patient verbalized understanding of echo results and plan of care. 

## 2018-06-18 ENCOUNTER — Encounter
Admission: RE | Admit: 2018-06-18 | Discharge: 2018-06-18 | Disposition: A | Discharge status: Home / Self Care | Payer: 59 | Source: Ambulatory Visit | Attending: Internal Medicine | Admitting: Internal Medicine

## 2018-06-18 DIAGNOSIS — R42 Dizziness and giddiness: Secondary | ICD-10-CM | POA: Diagnosis present

## 2018-06-18 DIAGNOSIS — R0602 Shortness of breath: Secondary | ICD-10-CM | POA: Diagnosis not present

## 2018-06-18 DIAGNOSIS — R0789 Other chest pain: Secondary | ICD-10-CM | POA: Diagnosis not present

## 2018-06-18 LAB — NM MYOCAR MULTI W/SPECT W/WALL MOTION / EF
CHL CUP MPHR: 170 {beats}/min
CHL CUP NUCLEAR SDS: 6
CHL CUP NUCLEAR SRS: 12
CHL CUP RESTING HR STRESS: 67 {beats}/min
CSEPEW: 1 METS
Exercise duration (min): 0 min
Exercise duration (sec): 0 s
LV dias vol: 96 mL (ref 46–106)
LV sys vol: 32 mL
NUC STRESS TID: 0.95
Peak HR: 88 {beats}/min
Percent HR: 51 %
SSS: 16

## 2018-06-18 MED ORDER — TECHNETIUM TC 99M TETROFOSMIN IV KIT
30.4700 | PACK | Freq: Once | INTRAVENOUS | Status: AC | PRN
  Administered 2018-06-18: 30.47 via INTRAVENOUS

## 2018-06-18 MED ORDER — REGADENOSON 0.4 MG/5ML IV SOLN
0.4000 mg | Freq: Once | INTRAVENOUS | Status: AC
  Administered 2018-06-18: 0.4 mg via INTRAVENOUS

## 2018-06-18 MED ORDER — TECHNETIUM TC 99M TETROFOSMIN IV KIT
11.0300 | PACK | Freq: Once | INTRAVENOUS | Status: AC | PRN
  Administered 2018-06-18: 11.03 via INTRAVENOUS

## 2018-06-19 ENCOUNTER — Encounter (HOSPITAL_COMMUNITY): Payer: 59 | Attending: Internal Medicine

## 2018-06-19 ENCOUNTER — Telehealth: Payer: Self-pay | Admitting: *Deleted

## 2018-06-19 NOTE — Telephone Encounter (Signed)
Results called to pt. Pt verbalized understanding.  

## 2018-06-19 NOTE — Telephone Encounter (Signed)
Patient calling to discuss recent Myoview testing results   Please call

## 2018-06-19 NOTE — Telephone Encounter (Signed)
No answer. Left message to call back.   

## 2018-06-19 NOTE — Telephone Encounter (Signed)
-----   Message from Yvonne Kendallhristopher End, MD sent at 06/19/2018  6:52 AM EDT ----- Please let the patient know that her stress test is normal.  She should continue her current medications and follow-up as previously discussed.

## 2018-07-16 ENCOUNTER — Ambulatory Visit: Payer: Self-pay | Admitting: Nurse Practitioner

## 2019-06-15 ENCOUNTER — Emergency Department
Admission: EM | Admit: 2019-06-15 | Discharge: 2019-06-15 | Disposition: A | Discharge status: Home / Self Care | Payer: No Typology Code available for payment source | Attending: Emergency Medicine | Admitting: Emergency Medicine

## 2019-06-15 ENCOUNTER — Emergency Department: Payer: No Typology Code available for payment source

## 2019-06-15 ENCOUNTER — Other Ambulatory Visit: Payer: Self-pay

## 2019-06-15 ENCOUNTER — Encounter: Payer: Self-pay | Admitting: Intensive Care

## 2019-06-15 DIAGNOSIS — Z87891 Personal history of nicotine dependence: Secondary | ICD-10-CM | POA: Diagnosis not present

## 2019-06-15 DIAGNOSIS — Z7982 Long term (current) use of aspirin: Secondary | ICD-10-CM | POA: Insufficient documentation

## 2019-06-15 DIAGNOSIS — Z96652 Presence of left artificial knee joint: Secondary | ICD-10-CM | POA: Diagnosis not present

## 2019-06-15 DIAGNOSIS — M25562 Pain in left knee: Secondary | ICD-10-CM | POA: Diagnosis present

## 2019-06-15 DIAGNOSIS — Z79899 Other long term (current) drug therapy: Secondary | ICD-10-CM | POA: Insufficient documentation

## 2019-06-15 DIAGNOSIS — Y999 Unspecified external cause status: Secondary | ICD-10-CM | POA: Diagnosis not present

## 2019-06-15 DIAGNOSIS — I10 Essential (primary) hypertension: Secondary | ICD-10-CM | POA: Insufficient documentation

## 2019-06-15 DIAGNOSIS — Y93K1 Activity, walking an animal: Secondary | ICD-10-CM | POA: Insufficient documentation

## 2019-06-15 DIAGNOSIS — W19XXXA Unspecified fall, initial encounter: Secondary | ICD-10-CM

## 2019-06-15 DIAGNOSIS — W1831XA Fall on same level due to stepping on an object, initial encounter: Secondary | ICD-10-CM | POA: Diagnosis not present

## 2019-06-15 DIAGNOSIS — E119 Type 2 diabetes mellitus without complications: Secondary | ICD-10-CM | POA: Diagnosis not present

## 2019-06-15 MED ORDER — HYDROCODONE-ACETAMINOPHEN 5-325 MG PO TABS: 1.0000 | ORAL_TABLET | ORAL | 0 refills | Status: DC | PRN

## 2019-06-15 MED ORDER — HYDROCODONE-ACETAMINOPHEN 5-325 MG PO TABS
2.0000 | ORAL_TABLET | Freq: Once | ORAL | Status: AC
  Administered 2019-06-15: 2 via ORAL
  Filled 2019-06-15: qty 2

## 2019-06-15 NOTE — ED Provider Notes (Signed)
[facility] Emergency Department Provider Note  Time seen: 9:32 AM  I have reviewed the triage vital signs and the nursing notes.   HISTORY  Chief Complaint Fall and Knee Pain   HPI Maria Hancock is a 53 y.o. female with a past medical history of anxiety, arthritis, depression, diabetes, hypertension, hyperlipidemia, presents to the emergency department after a fall.  According to the patient she was at work when a dog pulled while she was trying to walk him on a leash.  She fell forwards landing on her left knee.  Patient states a prior knee replacement approximately 7 years ago to the left knee.  Patient denies any other injuries.  Denies hitting her head denies LOC.  Patient states pain around the left knee with a mild numbness/tingling sensation around the left knee as well.   Past Medical History:  Diagnosis Date  . Anxiety   . Arthritis    "knees, back, hands" (02/26/2013)  . Depression   . Diabetes mellitus without complication ([place])   . GERD (gastroesophageal reflux disease)    with Mobic  . Hyperlipidemia   . Hypertension   . IBS (irritable bowel syndrome)   . Menorrhagia   . Migraine headache   . Psoriasis     Patient Active Problem List   Diagnosis Date Noted  . Atypical chest pain 05/31/2018  . SOB (shortness of breath) 05/31/2018  . Lightheadedness 05/31/2018  . Palpitations 05/31/2018  . Morbid obesity ([place]) 05/31/2018  . Plantar fasciitis of left foot 01/26/2018  . Major depression, recurrent, chronic ([place]) 12/02/2015  . Osteoarthritis of left knee 02/26/2013    Class: Diagnosis of    Past Surgical History:  Procedure Laterality Date  . HARDWARE REMOVAL Left 02/26/2013   Procedure: HARDWARE REMOVAL;  Surgeon: Meredith Pel, MD;  Location: [place];  Service: Orthopedics;  Laterality: Left;  Left Knee Removal of Hardware  . JOINT REPLACEMENT    . KNEE ARTHROPLASTY Left ?2001   "put hardware in" (02/26/2013)  . KNEE  ARTHROSCOPY Left    "I've had 2; 2nd one was 2013" (02/26/2013)  . REPLACEMENT TOTAL KNEE Left 02/26/2013   "w/hardware removal" (02/26/2013)  . TOTAL KNEE ARTHROPLASTY Left 02/26/2013   Procedure: TOTAL KNEE ARTHROPLASTY;  Surgeon: Meredith Pel, MD;  Location: [place];  Service: Orthopedics;  Laterality: Left;  Left Total Knee Arthroplasty   . TUBAL LIGATION  1988    Prior to Admission medications   Medication Sig Start Date End Date Taking? Authorizing Provider  ARIPiprazole (ABILIFY) 5 MG tablet Take 5 mg by mouth daily.    [provider]  aspirin EC 81 MG tablet Take 1 tablet (81 mg total) by mouth daily. 05/30/18   End, Harrell Gave, MD  chlorhexidine (PERIDEX) 0.12 % solution 5 ML TWICE A DAY SWISH AROUND FOR 15 SEC AND SPIT 05/03/18   [provider]  clonazePAM (KLONOPIN) 1 MG tablet TAKE 1/2 TO 1 TABLET BY MOUTH TWICE A DAY 01/28/18   [provider]  FLUoxetine (PROZAC) 20 MG tablet TAKE 1 AND ONE-HALF TABLETS (30MG  TOTAL) BY MOUTH DAILY. 05/07/18   [provider]  gabapentin (NEURONTIN) 300 MG capsule TAKE 1-2 CAPSULES BY MOUTH TWICE DAILY 01/17/18   [provider]  levothyroxine (SYNTHROID, LEVOTHROID) 25 MCG tablet Take 25 mcg by mouth daily. 05/07/18   [provider]  lisinopril (PRINIVIL,ZESTRIL) 20 MG tablet Take 20 mg by mouth daily.  01/09/18   [provider]  metFORMIN (GLUCOPHAGE) 500 MG tablet Take 500 mg by mouth 2 (two) times daily. 12/09/17   [provider]  oxybutynin (DITROPAN) 5 MG tablet TAKE 1 TABLET BY MOUTH IN THE MORNING AND 2 TABLETS AT BEDTIME 05/07/18   [provider]    No Known Allergies  Family History  Problem Relation Age of Onset  . Cirrhosis Father   . Heart disease Maternal Grandmother     Social History Social History   Tobacco Use  . Smoking status: Former Smoker    Packs/day: 0.50    Years: 16.00    Pack years: 8.00    Types: Cigarettes    Quit date: 10/04/1995     Years since quitting: 23.7  . Smokeless tobacco: Never Used  Substance Use Topics  . Alcohol use: No  . Drug use: No    Comment: denied all drug/alcohol/tobacco use    Review of Systems Constitutional: Negative for fever. Cardiovascular: Negative for chest pain. Respiratory: Negative for shortness of breath. Gastrointestinal: Negative for abdominal pain Musculoskeletal: Left knee pain Skin: Negative for skin complaints  Neurological: Negative for headache.  Tingling around the left knee. All other ROS negative  ____________________________________________   PHYSICAL EXAM:  VITAL SIGNS: ED Triage Vitals  Enc Vitals Group     BP 06/15/19 0920 109/78     Pulse Rate 06/15/19 0920 98     Resp 06/15/19 0920 18     Temp 06/15/19 0920 98.2 F (36.8 C)     Temp Source 06/15/19 0920 Oral     SpO2 06/15/19 0920 100 %     Weight 06/15/19 0918 (!) 305 lb (138.3 kg)     Height 06/15/19 0918 5\' 5"  (1.651 m)     Head Circumference --      Peak Flow --      Pain Score 06/15/19 0918 8     Pain Loc --      Pain Edu? --      Excl. in GC? --    Constitutional: Alert and oriented. Well appearing and in no distress. Eyes: Normal exam ENT      Head: Normocephalic and atraumatic.      Mouth/Throat: Mucous membranes are moist. Cardiovascular: Normal rate, regular rhythm. Respiratory: Normal respiratory effort without tachypnea nor retractions. Breath sounds are clear Gastrointestinal: Soft and nontender. No distention.   Musculoskeletal: Left knee tenderness to palpation, no erythema abrasion or notable swelling.  Neurovascularly intact distally with 2+ DP pulse and normal sensation distally.  Does state tingling with slight decrease in sensation around the knee itself. Neurologic:  Normal speech and language. No gross focal neurologic deficits  Skin:  Skin is warm, dry and intact.  Psychiatric: Mood and affect are  normal.  ____________________________________________   RADIOLOGY  X-rays negative for acute finding.  ____________________________________________   INITIAL IMPRESSION / ASSESSMENT AND PLAN / ED COURSE  Pertinent labs & imaging results that were available during my care of the patient were reviewed by me and considered in my medical decision making (see chart for details).   Patient presents emergency department after a fall with left knee pain.  Overall the patient appears well.  We will obtain x-ray imaging of the left knee as a precaution.  We will dose pain medication and continue to reassess the patient.  Patient's x-rays negative for acute abnormality.  Feels better with pain medication.  We will ambulate the patient as long she is able to ambulate well we will discharge with Norco  and PCP follow-up.  Maria Hancock was evaluated in Emergency Department on 06/15/2019 for the symptoms described in the history of present illness. She was evaluated in the context of the global COVID-19 pandemic, which necessitated consideration that the patient might be at risk for infection with the SARS-CoV-2 virus that causes COVID-19. Institutional protocols and algorithms that pertain to the evaluation of patients at risk for COVID-19 are in a state of rapid change based on information released by regulatory bodies including the CDC and federal and state organizations. These policies and algorithms were followed during the patient's care in the ED.  ____________________________________________   FINAL CLINICAL IMPRESSION(S) / ED DIAGNOSES  Left knee pain Thresa Ross, MD 06/15/19 1019

## 2019-06-15 NOTE — ED Notes (Signed)
Registration informed this RN that patient wants to file workmans comp. Patient was given hydrocodone tablets at 0943today prior to telling RN she wanted to file workmans comp.

## 2019-06-15 NOTE — ED Triage Notes (Signed)
Patient arrived by EMS from [facility] after a dog pulled her down to the ground. Patient fell on Left knee. C/o pain/numbness to left leg/knee. Left knee replacement X8 years ago. A&O x4 during triage/assessment

## 2019-12-14 DIAGNOSIS — M2550 Pain in unspecified joint: Secondary | ICD-10-CM | POA: Insufficient documentation

## 2020-04-14 ENCOUNTER — Telehealth: Payer: 59

## 2020-04-14 ENCOUNTER — Other Ambulatory Visit: Payer: Self-pay

## 2020-04-14 ENCOUNTER — Ambulatory Visit
Admission: EM | Admit: 2020-04-14 | Discharge: 2020-04-14 | Disposition: A | Discharge status: Home / Self Care | Payer: 59 | Attending: Family Medicine | Admitting: Family Medicine

## 2020-04-14 DIAGNOSIS — L089 Local infection of the skin and subcutaneous tissue, unspecified: Secondary | ICD-10-CM | POA: Diagnosis not present

## 2020-04-14 MED ORDER — MUPIROCIN 2 % EX OINT: 1.0000 "application " | TOPICAL_OINTMENT | Freq: Two times a day (BID) | CUTANEOUS | 0 refills | Status: AC

## 2020-04-14 MED ORDER — DOXYCYCLINE HYCLATE 100 MG PO CAPS: 100.0000 mg | ORAL_CAPSULE | Freq: Two times a day (BID) | ORAL | 0 refills | Status: DC

## 2020-04-14 MED ORDER — MELOXICAM 15 MG PO TABS: 15.0000 mg | ORAL_TABLET | Freq: Every day | ORAL | 0 refills | Status: DC | PRN

## 2020-04-14 NOTE — Discharge Instructions (Signed)
Medication as prescribed.  Wear gloves.  Take care  Dr. Adriana Simas

## 2020-04-14 NOTE — ED Triage Notes (Signed)
Patient complains of right hand pain and swelling without injury. States that she does get dog hair caught between her fingers at time, she is a Research scientist (medical).

## 2020-04-15 NOTE — ED Provider Notes (Signed)
[facility]    CSN: [id number] Arrival date & time: 04/14/20  1731  History   Chief Complaint Chief Complaint  Patient presents with  . Hand Pain    right   HPI  54 year old female presents with the above complaint.  3 week history of pain of the right hand. Has been worsening for the past few days. 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 Area is now swollen and painful. Location: between the 3rd and 4th digits. No relieving factors. Pain currently 4/10 in severity. No other complaints.  Past Medical History:  Diagnosis Date  . Anxiety   . Arthritis    "knees, back, hands" (02/26/2013)  . Depression   . Diabetes mellitus without complication (HCC)   . GERD (gastroesophageal reflux disease)    with Mobic  . Hyperlipidemia   . Hypertension   . IBS (irritable bowel syndrome)   . Menorrhagia   . Migraine headache   . Psoriasis     Patient Active Problem List   Diagnosis Date Noted  . Atypical chest pain 05/31/2018  . SOB (shortness of breath) 05/31/2018  . Lightheadedness 05/31/2018  . Palpitations 05/31/2018  . Morbid obesity (H[facility]) 05/31/2018  . Plantar fasciitis of left foot 01/26/2018  . Major depression, recurrent, chronic (HCC) 12/02/2015  . Osteoarthritis of left knee 02/26/2013    Class: Diagnosis of    Past Surgical History:  Procedure Laterality Date  . HARDWARE REMOVAL Left 02/26/2013   Procedure: HARDWARE REMOVAL;  Surgeon: Cammy Copa, MD;  Location: [facility] OR;  Service: Orthopedics;  Laterality: Left;  Left Knee Removal of Hardware  . JOINT REPLACEMENT    . KNEE ARTHROPLASTY Left ?2001   "put hardware in" (02/26/2013)  . KNEE ARTHROSCOPY Left    "I've had 2; 2nd one was 2013" (02/26/2013)  . REPLACEMENT TOTAL KNEE Left 02/26/2013   "w/hardware removal" (02/26/2013)  . TOTAL KNEE ARTHROPLASTY Left 02/26/2013   Procedure: TOTAL KNEE ARTHROPLASTY;  Surgeon: Cammy Copa, MD;   Location: [facility] OR;  Service: Orthopedics;  Laterality: Left;  Left Total Knee Arthroplasty   . TUBAL LIGATION  1988    OB History   No obstetric history on file.      Home Medications    Prior to Admission medications   Medication Sig Start Date End Date Taking? Authorizing Provider  ARIPiprazole (ABILIFY) 5 MG tablet Take 5 mg by mouth daily.   Yes [provider]  clonazePAM (KLONOPIN) 1 MG tablet TAKE 1/2 TO 1 TABLET BY MOUTH TWICE A DAY 01/28/18  Yes [provider]  DULoxetine (CYMBALTA) 20 MG capsule Take 20 mg by mouth daily.   Yes [provider]  escitalopram (LEXAPRO) 20 MG tablet Take 20 mg by mouth daily.   Yes [provider]  gabapentin (NEURONTIN) 300 MG capsule TAKE 1-2 CAPSULES BY MOUTH TWICE DAILY 01/17/18  Yes [provider]  levothyroxine (SYNTHROID, LEVOTHROID) 25 MCG tablet Take 25 mcg by mouth daily. 05/07/18  Yes [provider]  lisinopril (PRINIVIL,ZESTRIL) 20 MG tablet Take 20 mg by mouth daily.  01/09/18  Yes [provider]  metFORMIN (GLUCOPHAGE) 500 MG tablet Take 500 mg by mouth 2 (two) times daily. 12/09/17  Yes [provider]  oxybutynin (DITROPAN) 5 MG tablet TAKE 1 TABLET BY MOUTH IN THE MORNING AND 2 TABLETS AT BEDTIME 05/07/18  Yes [provider]  doxycycline (VIBRAMYCIN) 100 MG  capsule Take 1 capsule (100 mg total) by mouth 2 (two) times daily. 04/14/20   Tommie Sams, DO  meloxicam (MOBIC) 15 MG tablet Take 1 tablet (15 mg total) by mouth daily as needed for pain. 04/14/20   Tommie Sams, DO  mupirocin ointment (BACTROBAN) 2 % Apply 1 application topically 2 (two) times daily for 7 days. 04/14/20 04/21/20  Tommie Sams, DO  FLUoxetine (PROZAC) 20 MG tablet TAKE 1 AND ONE-HALF TABLETS (30MG  TOTAL) BY MOUTH DAILY. 05/07/18 04/14/20  [provider]    Family History Family History  Problem Relation Age of Onset  . Cirrhosis Father   . Heart disease Maternal Grandmother      Social History Social History   Tobacco Use  . Smoking status: Former Smoker    Packs/day: 0.50    Years: 16.00    Pack years: 8.00    Types: Cigarettes    Quit date: 10/04/1995    Years since quitting: 24.5  . Smokeless tobacco: Never Used  Vaping Use  . Vaping Use: Never used  Substance Use Topics  . Alcohol use: No  . Drug use: No    Comment: denied all drug/alcohol/tobacco use     Allergies   Patient has no known allergies.   Review of Systems Review of Systems  Constitutional: Negative.   Musculoskeletal:       Pain, right hand.   Physical Exam Triage Vital Signs ED Triage Vitals  Enc Vitals Group     BP 04/14/20 1747 114/79     Pulse Rate 04/14/20 1747 (!) 102     Resp 04/14/20 1747 18     Temp 04/14/20 1747 98.3 F (36.8 C)     Temp Source 04/14/20 1747 Oral     SpO2 04/14/20 1747 96 %     Weight 04/14/20 1743 300 lb (136.1 kg)     Height 04/14/20 1743 5\' 5"  (1.651 m)     Head Circumference --      Peak Flow --      Pain Score 04/14/20 1742 4     Pain Loc --      Pain Edu? --      Excl. in GC? --    Updated Vital Signs BP 114/79 (BP Location: Right Arm)   Pulse (!) 102   Temp 98.3 F (36.8 C) (Oral)   Resp 18   Ht 5\' 5"  (1.651 m)   Wt 136.1 kg   SpO2 96%   BMI 49.92 kg/m   Visual Acuity Right Eye Distance:   Left Eye Distance:   Bilateral Distance:    Right Eye Near:   Left Eye Near:    Bilateral Near:     Physical Exam Constitutional:      Appearance: Normal appearance. She is obese.  HENT:     Head: Normocephalic and atraumatic.  Eyes:     General:        Right eye: No discharge.        Left eye: No discharge.     Conjunctiva/sclera: Conjunctivae normal.  Pulmonary:     Effort: Pulmonary effort is normal. No respiratory distress.  Skin:    Comments: Right hand - discrete area of redness, swelling and tenderness between the 3rd and 4th digits at the level of the MCP joints.  Neurological:     Mental Status: She is  alert.  Psychiatric:        Mood and Affect: Mood normal.  Behavior: Behavior normal.    UC Treatments / Results  Labs (all labs ordered are listed, but only abnormal results are displayed) Labs Reviewed - No data to display  EKG   Radiology No results found.  Procedures Procedures (including critical care time)  Medications Ordered in UC Medications - No data to display  Initial Impression / Assessment and Plan / UC Course  I have reviewed the triage vital signs and the nursing notes.  Pertinent labs & imaging results that were available during my care of the patient were reviewed by me and considered in my medical decision making (see chart for details).    54 year old female presents with an infection of the right hand.  Treating with doxy, bactroban ointment and mobic.  Final Clinical Impressions(s) / UC Diagnoses   Final diagnoses:  Infection of hand     Discharge Instructions     Medication as prescribed.  Wear gloves.  Take care  Dr. Adriana Simas    ED Prescriptions    Medication Sig Dispense Auth. Provider   doxycycline (VIBRAMYCIN) 100 MG capsule Take 1 capsule (100 mg total) by mouth 2 (two) times daily. 14 capsule Matsue Strom G, DO   mupirocin ointment (BACTROBAN) 2 % Apply 1 application topically 2 (two) times daily for 7 days. 22 g Kenlyn Lose G, DO   meloxicam (MOBIC) 15 MG tablet Take 1 tablet (15 mg total) by mouth daily as needed for pain. 30 tablet Tommie Sams, DO     PDMP not reviewed this encounter.   Tommie Sams, [place] 04/15/20 1107

## 2020-04-17 ENCOUNTER — Other Ambulatory Visit: Payer: Self-pay

## 2020-04-17 ENCOUNTER — Emergency Department
Admission: EM | Admit: 2020-04-17 | Discharge: 2020-04-17 | Disposition: A | Discharge status: Home / Self Care | Payer: 59 | Attending: Emergency Medicine | Admitting: Emergency Medicine

## 2020-04-17 DIAGNOSIS — Z79899 Other long term (current) drug therapy: Secondary | ICD-10-CM | POA: Diagnosis not present

## 2020-04-17 DIAGNOSIS — I10 Essential (primary) hypertension: Secondary | ICD-10-CM | POA: Diagnosis not present

## 2020-04-17 DIAGNOSIS — Z7984 Long term (current) use of oral hypoglycemic drugs: Secondary | ICD-10-CM | POA: Insufficient documentation

## 2020-04-17 DIAGNOSIS — L089 Local infection of the skin and subcutaneous tissue, unspecified: Secondary | ICD-10-CM

## 2020-04-17 DIAGNOSIS — R2231 Localized swelling, mass and lump, right upper limb: Secondary | ICD-10-CM | POA: Diagnosis present

## 2020-04-17 DIAGNOSIS — E119 Type 2 diabetes mellitus without complications: Secondary | ICD-10-CM | POA: Diagnosis not present

## 2020-04-17 DIAGNOSIS — Z96652 Presence of left artificial knee joint: Secondary | ICD-10-CM | POA: Insufficient documentation

## 2020-04-17 DIAGNOSIS — M79641 Pain in right hand: Secondary | ICD-10-CM | POA: Diagnosis not present

## 2020-04-17 DIAGNOSIS — A499 Bacterial infection, unspecified: Secondary | ICD-10-CM | POA: Insufficient documentation

## 2020-04-17 DIAGNOSIS — Z87891 Personal history of nicotine dependence: Secondary | ICD-10-CM | POA: Diagnosis not present

## 2020-04-17 MED ORDER — OXYCODONE-ACETAMINOPHEN 7.5-325 MG PO TABS: 1.0000 | ORAL_TABLET | Freq: Four times a day (QID) | ORAL | 0 refills | Status: AC | PRN

## 2020-04-17 NOTE — Discharge Instructions (Signed)
Follow discharge care instruction.  Continue previous medication.  Take Percocet as needed for pain.  Follow-up with PCP in 1 week.

## 2020-04-17 NOTE — ED Provider Notes (Signed)
[facility] Emergency Department Provider Note   ____________________________________________   First MD Initiated Contact with Patient 04/17/20 1121     (approximate)  I have reviewed the triage vital signs and the nursing notes.   HISTORY  Chief Complaint Hand Pain    HPI Maria Hancock is a 54 y.o. female patient presents with pain edema erythema to the right hand.  Patient has a nodular lesion on erythematous base between the third and fourth fingers.  Patient was seen in urgent care clinic 3 days ago started on doxycycline and Mobic denies loss of sensation or function.  States pain is a constant 3/10.  It increases to a 7/10 with use.  Describes the pain as "achy".         Past Medical History:  Diagnosis Date  . Anxiety   . Arthritis    "knees, back, hands" (02/26/2013)  . Depression   . Diabetes mellitus without complication (HCC)   . GERD (gastroesophageal reflux disease)    with Mobic  . Hyperlipidemia   . Hypertension   . IBS (irritable bowel syndrome)   . Menorrhagia   . Migraine headache   . Psoriasis     Patient Active Problem List   Diagnosis Date Noted  . Atypical chest pain 05/31/2018  . SOB (shortness of breath) 05/31/2018  . Lightheadedness 05/31/2018  . Palpitations 05/31/2018  . Morbid obesity (HCC) 05/31/2018  . Plantar fasciitis of left foot 01/26/2018  . Major depression, recurrent, chronic (HCC) 12/02/2015  . Osteoarthritis of left knee 02/26/2013    Class: Diagnosis of    Past Surgical History:  Procedure Laterality Date  . HARDWARE REMOVAL Left 02/26/2013   Procedure: HARDWARE REMOVAL;  Surgeon: Cammy Copa, MD;  Location: [facility] OR;  Service: Orthopedics;  Laterality: Left;  Left Knee Removal of Hardware  . JOINT REPLACEMENT    . KNEE ARTHROPLASTY Left ?2001   "put hardware in" (02/26/2013)  . KNEE ARTHROSCOPY Left    "I've had 2; 2nd one was 2013" (02/26/2013)  . REPLACEMENT TOTAL KNEE Left  02/26/2013   "w/hardware removal" (02/26/2013)  . TOTAL KNEE ARTHROPLASTY Left 02/26/2013   Procedure: TOTAL KNEE ARTHROPLASTY;  Surgeon: Cammy Copa, MD;  Location: [facility] OR;  Service: Orthopedics;  Laterality: Left;  Left Total Knee Arthroplasty   . TUBAL LIGATION  1988    Prior to Admission medications   Medication Sig Start Date End Date Taking? Authorizing Provider  ARIPiprazole (ABILIFY) 5 MG tablet Take 5 mg by mouth daily.    [provider]  clonazePAM (KLONOPIN) 1 MG tablet TAKE 1/2 TO 1 TABLET BY MOUTH TWICE A DAY 01/28/18   [provider]  doxycycline (VIBRAMYCIN) 100 MG capsule Take 1 capsule (100 mg total) by mouth 2 (two) times daily. 04/14/20   Tommie Sams, DO  DULoxetine (CYMBALTA) 20 MG capsule Take 20 mg by mouth daily.    [provider]  escitalopram (LEXAPRO) 20 MG tablet Take 20 mg by mouth daily.    [provider]  gabapentin (NEURONTIN) 300 MG capsule TAKE 1-2 CAPSULES BY MOUTH TWICE DAILY 01/17/18   [provider]  levothyroxine (SYNTHROID, LEVOTHROID) 25 MCG tablet Take 25 mcg by mouth daily. 05/07/18   [provider]  lisinopril (PRINIVIL,ZESTRIL) 20 MG tablet Take 20 mg by mouth daily.  01/09/18   [provider]  meloxicam (MOBIC) 15 MG tablet Take 1 tablet (15 mg total) by mouth daily as needed for pain.  04/14/20   Tommie Sams, DO  metFORMIN (GLUCOPHAGE) 500 MG tablet Take 500 mg by mouth 2 (two) times daily. 12/09/17   [provider]  mupirocin ointment (BACTROBAN) 2 % Apply 1 application topically 2 (two) times daily for 7 days. 04/14/20 04/21/20  Tommie Sams, DO  oxybutynin (DITROPAN) 5 MG tablet TAKE 1 TABLET BY MOUTH IN THE MORNING AND 2 TABLETS AT BEDTIME 05/07/18   [provider]  oxyCODONE-acetaminophen (PERCOCET) 7.5-325 MG tablet Take 1 tablet by mouth every 6 (six) hours as needed for up to 5 days. 04/17/20 04/22/20  Joni Reining, PA-C  FLUoxetine (PROZAC) 20 MG tablet  TAKE 1 AND ONE-HALF TABLETS (30MG  TOTAL) BY MOUTH DAILY. 05/07/18 04/14/20  [provider]    Allergies Patient has no known allergies.  Family History  Problem Relation Age of Onset  . Cirrhosis Father   . Heart disease Maternal Grandmother     Social History Social History   Tobacco Use  . Smoking status: Former Smoker    Packs/day: 0.50    Years: 16.00    Pack years: 8.00    Types: Cigarettes    Quit date: 10/04/1995    Years since quitting: 24.5  . Smokeless tobacco: Never Used  Vaping Use  . Vaping Use: Never used  Substance Use Topics  . Alcohol use: No  . Drug use: No    Comment: denied all drug/alcohol/tobacco use    Review of Systems Constitutional: No fever/chills Eyes: No visual changes. ENT: No sore throat. Cardiovascular: Denies chest pain. Respiratory: Denies shortness of breath. Gastrointestinal: No abdominal pain.  No nausea, no vomiting.  No diarrhea.  No constipation. Genitourinary: Negative for dysuria. Musculoskeletal: Negative for back pain. Skin: Negative for rash. Neurological: Negative for headaches, focal weakness or numbness. Psychiatric:  Anxiety and depression. Endocrine:  Diabetes, hyperlipidemia, hypertension. ____________________________________________   PHYSICAL EXAM:  VITAL SIGNS: ED Triage Vitals  Enc Vitals Group     BP 04/17/20 1029 124/86     Pulse Rate 04/17/20 1039 (!) 110     Resp 04/17/20 1029 17     Temp 04/17/20 1029 97.6 F (36.4 C)     Temp src --      SpO2 04/17/20 1029 98 %     Weight 04/17/20 1029 (!) 306 lb (138.8 kg)     Height 04/17/20 1029 5\' 5"  (1.651 m)     Head Circumference --      Peak Flow --      Pain Score 04/17/20 1029 3     Pain Loc --      Pain Edu? --      Excl. in GC? --     Constitutional: Alert and oriented. Well appearing and in no acute distress. Eyes: Conjunctivae are normal. PERRL. EOMI. Cardiovascular: Normal rate, regular rhythm. Grossly normal heart sounds.  Good  peripheral circulation. Respiratory: Normal respiratory effort.  No retractions. Lungs CTAB. Musculoskeletal: No lower extremity tenderness nor edema.  No joint effusions. Neurologic:  Normal speech and language. No gross focal neurologic deficits are appreciated. No gait instability. Skin: Edema and erythema are webspace of the third and fourth digit right hand.  Nodule lesion is palpable but nonfluctuant.  Psychiatric: Mood and affect are normal. Speech and behavior are normal.  ____________________________________________   LABS (all labs ordered are listed, but only abnormal results are displayed)  Labs Reviewed - No data to display ____________________________________________  EKG   ____________________________________________  RADIOLOGY  ED MD interpretation:  Official radiology report(s): No results found.  ____________________________________________   PROCEDURES  Procedure(s) performed (including Critical Care):  Procedures   ____________________________________________   INITIAL IMPRESSION / ASSESSMENT AND PLAN / ED COURSE  As part of my medical decision making, I reviewed the following data within the electronic MEDICAL RECORD NUMBER     Right hand pain secondary to cellulitis.  Patient advised continue antibiotics and start Percocet as needed for pain.  Follow-up with PCP if no improvement in 3 days.    KEONTA MONCEAUX was evaluated in Emergency Department on 04/17/2020 for the symptoms described in the history of present illness. She was evaluated in the context of the global COVID-19 pandemic, which necessitated consideration that the patient might be at risk for infection with the SARS-CoV-2 virus that causes COVID-19. Institutional protocols and algorithms that pertain to the evaluation of patients at risk for COVID-19 are in a state of rapid change based on information released by regulatory bodies including the CDC and federal and state organizations.  These policies and algorithms were followed during the patient's care in the ED.       ____________________________________________   FINAL CLINICAL IMPRESSION(S) / ED DIAGNOSES  Final diagnoses:  Right hand pain  Skin infection, bacterial     ED Discharge Orders         Ordered    oxyCODONE-acetaminophen (PERCOCET) 7.5-325 MG tablet  Every 6 hours PRN     Discontinue  Reprint     04/17/20 1127           Note:  This document was prepared using Dragon voice recognition software and may include unintentional dictation errors.    Joni Reining, PA-C 04/17/20 1138    Sharman Cheek, MD 04/17/20 [phone]

## 2020-04-17 NOTE — ED Triage Notes (Signed)
Pt comes via POV from home with c/o left hand pain and some swelling. Pt states she noticed the area about 2 weeks ago.  Pt has swelling and small area noted between middle and ring finger.

## 2020-04-17 NOTE — ED Notes (Signed)
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

## 2020-06-11 ENCOUNTER — Ambulatory Visit (INDEPENDENT_AMBULATORY_CARE_PROVIDER_SITE_OTHER): Payer: 59

## 2020-06-11 ENCOUNTER — Encounter: Payer: Self-pay | Admitting: Emergency Medicine

## 2020-06-11 ENCOUNTER — Ambulatory Visit
Admission: EM | Admit: 2020-06-11 | Discharge: 2020-06-11 | Disposition: A | Discharge status: Home / Self Care | Payer: 59 | Attending: Physician Assistant | Admitting: Physician Assistant

## 2020-06-11 ENCOUNTER — Other Ambulatory Visit: Payer: Self-pay

## 2020-06-11 DIAGNOSIS — R05 Cough: Secondary | ICD-10-CM | POA: Diagnosis not present

## 2020-06-11 DIAGNOSIS — R0602 Shortness of breath: Secondary | ICD-10-CM

## 2020-06-11 DIAGNOSIS — Z8701 Personal history of pneumonia (recurrent): Secondary | ICD-10-CM

## 2020-06-11 DIAGNOSIS — R059 Cough, unspecified: Secondary | ICD-10-CM

## 2020-06-11 MED ORDER — PREDNISONE 20 MG PO TABS: 40.0000 mg | ORAL_TABLET | Freq: Every day | ORAL | 0 refills | Status: AC

## 2020-06-11 MED ORDER — BENZONATATE 200 MG PO CAPS: 200.0000 mg | ORAL_CAPSULE | Freq: Three times a day (TID) | ORAL | 0 refills | Status: AC | PRN

## 2020-06-11 MED ORDER — ALBUTEROL SULFATE HFA 108 (90 BASE) MCG/ACT IN AERS: 1.0000 | INHALATION_SPRAY | Freq: Four times a day (QID) | RESPIRATORY_TRACT | 0 refills | Status: DC | PRN

## 2020-06-11 NOTE — Discharge Instructions (Addendum)
Your x-ray shows that the pneumonia is resolved.  It is possible to have lingering symptoms for sometimes a couple of weeks after pneumonia diagnosis.  At this time try prednisone and the inhaler.  You can take [employer] as needed for cough.  Continue to rest and increase your fluid intake.  If you have a fever, increased or worsening cough, or worsening shortness of breath you should be seen again.  If not feeling better in a week you should have a telemedicine visit with your primary provider where they might consider a short course of another antibiotic, such as Levaquin.

## 2020-06-11 NOTE — ED Provider Notes (Signed)
[facility]    CSN: [id number] Arrival date & time: 06/11/20  0807      History   Chief Complaint Chief Complaint  Patient presents with  . Cough    HPI Maria Hancock is a 54 y.o. female.   54 year old female presents for 3-week history of productive cough, fatigue, shortness of breath on exertion.  She says [facility] and had a chest x-ray for the symptoms.  She says her oxygen was a little bit low at that time but does not remember what the oxygen percentage was.  She says the chest x-ray showed that she had left lower lobe pneumonia.  She believes she was treated with azithromycin and cefdinir.  She says those medications helped a little bit, but she is still having somewhat productive cough and shortness of breath on exertion.  She says she is still not feeling well.  She denies using any inhalers, prednisone, or taking any cough medication.  Patient denies fever, admits to chills.  She admits to chest pain occasionally when she coughs.  Patient has no history of cardiopulmonary disease.  Past medical history significant for diabetes, hypertension, hyperlipidemia, and obesity.  Patient denies any Covid exposure and has had 2 - Covid test, with the last test being 4 days ago.  She says her symptoms are not worse but she still not feeling well.  She says she is about 50% better from onset of symptoms.  No other concerns today.     Past Medical History:  Diagnosis Date  . Anxiety   . Arthritis    "knees, back, hands" (02/26/2013)  . Depression   . Diabetes mellitus without complication (HCC)   . GERD (gastroesophageal reflux disease)    with Mobic  . Hyperlipidemia   . Hypertension   . IBS (irritable bowel syndrome)   . Menorrhagia   . Migraine headache   . Psoriasis     Patient Active Problem List   Diagnosis Date Noted  . Atypical chest pain 05/31/2018  . SOB (shortness of breath) 05/31/2018  . Lightheadedness  05/31/2018  . Palpitations 05/31/2018  . Morbid obesity (HCC) 05/31/2018  . Plantar fasciitis of left foot 01/26/2018  . Major depression, recurrent, chronic (HCC) 12/02/2015  . Osteoarthritis of left knee 02/26/2013    Class: Diagnosis of    Past Surgical History:  Procedure Laterality Date  . HARDWARE REMOVAL Left 02/26/2013   Procedure: HARDWARE REMOVAL;  Surgeon: Cammy Copa, MD;  Location: [facility] OR;  Service: Orthopedics;  Laterality: Left;  Left Knee Removal of Hardware  . JOINT REPLACEMENT    . KNEE ARTHROPLASTY Left ?2001   "put hardware in" (02/26/2013)  . KNEE ARTHROSCOPY Left    "I've had 2; 2nd one was 2013" (02/26/2013)  . REPLACEMENT TOTAL KNEE Left 02/26/2013   "w/hardware removal" (02/26/2013)  . TOTAL KNEE ARTHROPLASTY Left 02/26/2013   Procedure: TOTAL KNEE ARTHROPLASTY;  Surgeon: Cammy Copa, MD;  Location: [facility] OR;  Service: Orthopedics;  Laterality: Left;  Left Total Knee Arthroplasty   . TUBAL LIGATION  1988    OB History   No obstetric history on file.      Home Medications    Prior to Admission medications   Medication Sig Start Date End Date Taking? Authorizing Provider  ARIPiprazole (ABILIFY) 5 MG tablet Take 5 mg by mouth daily.   Yes [provider]  clonazePAM (KLONOPIN) 1 MG tablet TAKE 1/2 TO  1 TABLET BY MOUTH TWICE A DAY 01/28/18  Yes [provider]  DULoxetine (CYMBALTA) 20 MG capsule Take 20 mg by mouth daily.   Yes [provider]  escitalopram (LEXAPRO) 20 MG tablet Take 20 mg by mouth daily.   Yes [provider]  gabapentin (NEURONTIN) 300 MG capsule TAKE 1-2 CAPSULES BY MOUTH TWICE DAILY 01/17/18  Yes [provider]  levothyroxine (SYNTHROID, LEVOTHROID) 25 MCG tablet Take 25 mcg by mouth daily. 05/07/18  Yes [provider]  lisinopril (PRINIVIL,ZESTRIL) 20 MG tablet Take 20 mg by mouth daily.  01/09/18  Yes [provider]  metFORMIN (GLUCOPHAGE) 500 MG tablet Take 500 mg  by mouth 2 (two) times daily. 12/09/17  Yes [provider]  albuterol (VENTOLIN HFA) 108 (90 Base) MCG/ACT inhaler Inhale 1-2 puffs into the lungs every 6 (six) hours as needed for up to 15 days for wheezing or shortness of breath. 06/11/20 06/26/20  Shirlee Latch, PA-C  benzonatate (TESSALON) 200 MG capsule Take 1 capsule (200 mg total) by mouth 3 (three) times daily as needed for up to 15 days for cough. 06/11/20 06/26/20  Eusebio Friendly B, PA-C  doxycycline (VIBRAMYCIN) 100 MG capsule Take 1 capsule (100 mg total) by mouth 2 (two) times daily. 04/14/20   Tommie Sams, DO  meloxicam (MOBIC) 15 MG tablet Take 1 tablet (15 mg total) by mouth daily as needed for pain. 04/14/20   Tommie Sams, DO  oxybutynin (DITROPAN) 5 MG tablet TAKE 1 TABLET BY MOUTH IN THE MORNING AND 2 TABLETS AT BEDTIME 05/07/18   [provider]  predniSONE (DELTASONE) 20 MG tablet Take 2 tablets (40 mg total) by mouth daily for 5 days. 06/11/20 06/16/20  Eusebio Friendly B, PA-C  FLUoxetine (PROZAC) 20 MG tablet TAKE 1 AND ONE-HALF TABLETS (30MG  TOTAL) BY MOUTH DAILY. 05/07/18 04/14/20  [provider]    Family History Family History  Problem Relation Age of Onset  . Cirrhosis Father   . Heart disease Maternal Grandmother     Social History Social History   Tobacco Use  . Smoking status: Former Smoker    Packs/day: 0.50    Years: 16.00    Pack years: 8.00    Types: Cigarettes    Quit date: 10/04/1995    Years since quitting: 24.7  . Smokeless tobacco: Never Used  Vaping Use  . Vaping Use: Never used  Substance Use Topics  . Alcohol use: No  . Drug use: No    Comment: denied all drug/alcohol/tobacco use     Allergies   Patient has no known allergies.   Review of Systems Review of Systems  Constitutional: Positive for chills and fatigue. Negative for diaphoresis and fever.  HENT: Positive for congestion. Negative for ear pain, rhinorrhea, sinus pressure, sinus pain and sore throat.     Respiratory: Positive for cough and shortness of breath.   Cardiovascular: Positive for chest pain (with cough).  Gastrointestinal: Negative for abdominal pain, nausea and vomiting.  Musculoskeletal: Negative for arthralgias and myalgias.  Skin: Negative for rash.  Neurological: Negative for weakness and headaches.  Hematological: Negative for adenopathy.     Physical Exam Triage Vital Signs ED Triage Vitals  Enc Vitals Group     BP 06/11/20 0826 117/67     Pulse Rate 06/11/20 0826 90     Resp 06/11/20 0826 18     Temp 06/11/20 0826 98.6 F (37 C)     Temp Source 06/11/20 08/11/20  Oral     SpO2 06/11/20 0826 98 %     Weight 06/11/20 0823 (!) 306 lb (138.8 kg)     Height 06/11/20 0823 5\' 5"  (1.651 m)     Head Circumference --      Peak Flow --      Pain Score 06/11/20 0822 3     Pain Loc --      Pain Edu? --      Excl. in GC? --    No data found.  Updated Vital Signs BP 117/67 (BP Location: Left Arm)   Pulse 90   Temp 98.6 F (37 C) (Oral)   Resp 18   Ht 5\' 5"  (1.651 m)   Wt (!) 306 lb (138.8 kg)   SpO2 98%   BMI 50.92 kg/m       Physical Exam Vitals and nursing note reviewed.  Constitutional:      General: She is not in acute distress.    Appearance: Normal appearance. She is obese. She is not ill-appearing or toxic-appearing.  HENT:     Head: Normocephalic and atraumatic.     Nose: Nose normal.     Mouth/Throat:     Mouth: Mucous membranes are moist.     Pharynx: Oropharynx is clear. Posterior oropharyngeal erythema (clear PND) present.  Eyes:     General: No scleral icterus.       Right eye: No discharge.        Left eye: No discharge.     Conjunctiva/sclera: Conjunctivae normal.  Cardiovascular:     Rate and Rhythm: Normal rate and regular rhythm.     Heart sounds: Normal heart sounds.  Pulmonary:     Effort: Pulmonary effort is normal. No respiratory distress.     Breath sounds: Normal breath sounds. No wheezing, rhonchi or rales.   Musculoskeletal:     Cervical back: Neck supple.  Skin:    General: Skin is dry.  Neurological:     General: No focal deficit present.     Mental Status: She is alert. Mental status is at baseline.     Motor: No weakness.     Gait: Gait normal.  Psychiatric:        Mood and Affect: Mood normal.        Behavior: Behavior normal.        Thought Content: Thought content normal.      UC Treatments / Results  Labs (all labs ordered are listed, but only abnormal results are displayed) Labs Reviewed - No data to display  EKG   Radiology DG Chest 2 View  Result Date: 06/11/2020 CLINICAL DATA:  Shortness of breath. EXAM: CHEST - 2 VIEW COMPARISON:  May 23, 2018. FINDINGS: The heart size and mediastinal contours are within normal limits. Both lungs are clear. No pneumothorax or pleural effusion is noted. The visualized skeletal structures are unremarkable. IMPRESSION: No active cardiopulmonary disease. Electronically Signed   By: 08/11/2020 M.D.   On: 06/11/2020 08:57    Procedures Procedures (including critical care time)  Medications Ordered in UC Medications - No data to display  Initial Impression / Assessment and Plan / UC Course  I have reviewed the triage vital signs and the nursing notes.  Pertinent labs & imaging results that were available during my care of the patient were reviewed by me and considered in my medical decision making (see chart for details).   Patient with previously diagnosed pneumonia based on chest x-ray.  Chest  x-ray repeated today, 3 weeks later.  Chest x-ray is within normal limits today discussed with patient that sometimes symptoms of pneumonia can linger.  Advise beginning prednisone at this time.  Discussed that it can elevate her blood sugars and keep a close eye on those.  Also sent an inhaler.  Also send benzonatate for her cough.  Advised her to follow-up with your PCP.  For any worsening or new symptoms she should return for another  exam.   Final Clinical Impressions(s) / UC Diagnoses   Final diagnoses:  Personal history of pneumonia  Cough  Shortness of breath     Discharge Instructions     Your x-ray shows that the pneumonia is resolved.  It is possible to have lingering symptoms for sometimes a couple of weeks after pneumonia diagnosis.  At this time try prednisone and the inhaler.  You can take [employer] as needed for cough.  Continue to rest and increase your fluid intake.  If you have a fever, increased or worsening cough, or worsening shortness of breath you should be seen again.  If not feeling better in a week you should have a telemedicine visit with your primary provider where they might consider a short course of another antibiotic, such as Levaquin.    ED Prescriptions    Medication Sig Dispense Auth. Provider   predniSONE (DELTASONE) 20 MG tablet Take 2 tablets (40 mg total) by mouth daily for 5 days. 10 tablet Eusebio FriendlyEaves, Trigg Delarocha B, PA-C   albuterol (VENTOLIN HFA) 108 (90 Base) MCG/ACT inhaler Inhale 1-2 puffs into the lungs every 6 (six) hours as needed for up to 15 days for wheezing or shortness of breath. 1 g Eusebio FriendlyEaves, Calinda Stockinger B, PA-C   benzonatate (TESSALON) 200 MG capsule Take 1 capsule (200 mg total) by mouth 3 (three) times daily as needed for up to 15 days for cough. 30 capsule Shirlee LatchEaves, Larkin Alfred B, PA-C     PDMP not reviewed this encounter.   Eusebio Friendlyaves, Iyannah Blake B, PA-C 06/11/20 [phone]

## 2020-06-11 NOTE — ED Triage Notes (Signed)
Pt c/o cough, fatigue, chest pain, and sob. Started about 3 weeks ago. She states she was diagnosed with pneumonia at another urgent care. She was treated with 2 different antibiotics. She was negative for covid and retested about 4 days ago and was negative.

## 2020-07-02 ENCOUNTER — Other Ambulatory Visit: Payer: Self-pay

## 2020-07-02 ENCOUNTER — Ambulatory Visit
Admission: EM | Admit: 2020-07-02 | Discharge: 2020-07-02 | Disposition: A | Discharge status: Home / Self Care | Payer: 59 | Attending: Emergency Medicine | Admitting: Emergency Medicine

## 2020-07-02 ENCOUNTER — Encounter: Payer: Self-pay | Admitting: Emergency Medicine

## 2020-07-02 DIAGNOSIS — M5442 Lumbago with sciatica, left side: Secondary | ICD-10-CM | POA: Diagnosis not present

## 2020-07-02 MED ORDER — METHOCARBAMOL 500 MG PO TABS: 500.0000 mg | ORAL_TABLET | Freq: Two times a day (BID) | ORAL | 0 refills | Status: DC

## 2020-07-02 MED ORDER — PREDNISONE 10 MG PO TABS: ORAL_TABLET | ORAL | 0 refills | Status: DC

## 2020-07-02 NOTE — Discharge Instructions (Signed)
Stop taking Aleve and start prednisone. You will take 3 tablets on day 1, 2 tablets days 2-5, and a single tablet on day 6.  Take the methocarbamol 1000 mg every 6 hours for the next 3 days and then as needed.  Apply moist heat to the area to improve blood flow and aid in healing. You can accomplish this by taking a hand towel, wetting it with hot water, wring it out and apply it to your low back. Place a heating pad over top and leave it in place for 20-30 minutes. Only use water hot enough that you can stand it so as to not get burned.   Monitor yoru sugars closely. The prednisone will cause them to go up, possibly in the 300's. If they go higher, or you develop blurry vision, increased thirst, or increased urination come back for re-evaluation.  Follow the rehab exercise handout given at discharge.

## 2020-07-02 NOTE — ED Triage Notes (Signed)
Pt c/o lower back pain. Pain radiates down her left leg and into her left foot. Started about 6 days ago. She has tried OTC pain patches, aleve, ibuprofen, and topical creams without relief.

## 2020-07-02 NOTE — ED Provider Notes (Signed)
[facility]    CSN: [id number] Arrival date & time: 07/02/20  1540      History   Chief Complaint Chief Complaint  Patient presents with  . Back Pain    HPI Maria Hancock is a 54 y.o. female.   54 yo female here for evaluation of low back pain L>R.  She reports that she has had pain for 6 days.   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 She cannot remember a single event that started her symptoms. She reports that 6 days ago her back was hurting at the end of her shift.  The left hurts more than the right. She has occasional pain that shoots through her buttock, down the outside of her left leg and into her foot. She also reports that she cannot lift her left leg to get into her shower tub at home.      Past Medical History:  Diagnosis Date  . Anxiety   . Arthritis    "knees, back, hands" (02/26/2013)  . Depression   . Diabetes mellitus without complication (HCC)   . GERD (gastroesophageal reflux disease)    with Mobic  . Hyperlipidemia   . Hypertension   . IBS (irritable bowel syndrome)   . Menorrhagia   . Migraine headache   . Psoriasis     Patient Active Problem List   Diagnosis Date Noted  . Atypical chest pain 05/31/2018  . SOB (shortness of breath) 05/31/2018  . Lightheadedness 05/31/2018  . Palpitations 05/31/2018  . Morbid obesity (HCC) 05/31/2018  . Plantar fasciitis of left foot 01/26/2018  . Major depression, recurrent, chronic (HCC) 12/02/2015  . Osteoarthritis of left knee 02/26/2013    Class: Diagnosis of    Past Surgical History:  Procedure Laterality Date  . HARDWARE REMOVAL Left 02/26/2013   Procedure: HARDWARE REMOVAL;  Surgeon: Cammy Copa, MD;  Location: [facility] OR;  Service: Orthopedics;  Laterality: Left;  Left Knee Removal of Hardware  . JOINT REPLACEMENT    . KNEE ARTHROPLASTY Left ?2001   "put hardware in" (02/26/2013)  . KNEE ARTHROSCOPY Left    "I've had 2; 2nd one was 2013" (02/26/2013)  .  REPLACEMENT TOTAL KNEE Left 02/26/2013   "w/hardware removal" (02/26/2013)  . TOTAL KNEE ARTHROPLASTY Left 02/26/2013   Procedure: TOTAL KNEE ARTHROPLASTY;  Surgeon: Cammy Copa, MD;  Location: [facility] OR;  Service: Orthopedics;  Laterality: Left;  Left Total Knee Arthroplasty   . TUBAL LIGATION  1988    OB History   No obstetric history on file.      Home Medications    Prior to Admission medications   Medication Sig Start Date End Date Taking? Authorizing Provider  ARIPiprazole (ABILIFY) 5 MG tablet Take 5 mg by mouth daily.   Yes [provider]  clonazePAM (KLONOPIN) 1 MG tablet TAKE 1/2 TO 1 TABLET BY MOUTH TWICE A DAY 01/28/18  Yes [provider]  DULoxetine (CYMBALTA) 20 MG capsule Take 20 mg by mouth daily.   Yes [provider]  escitalopram (LEXAPRO) 20 MG tablet Take 20 mg by mouth daily.   Yes [provider]  gabapentin (NEURONTIN) 300 MG capsule TAKE 1-2 CAPSULES BY MOUTH TWICE DAILY 01/17/18  Yes [provider]  levothyroxine (SYNTHROID, LEVOTHROID) 25 MCG tablet Take 25 mcg by mouth daily. 05/07/18  Yes [provider]  lisinopril (PRINIVIL,ZESTRIL) 20 MG tablet Take 20 mg by mouth daily.  01/09/18  Yes [provider]  metFORMIN (GLUCOPHAGE) 500 MG tablet Take 500 mg by mouth 2 (two) times daily. 12/09/17  Yes [provider]  oxybutynin (DITROPAN) 5 MG tablet TAKE 1 TABLET BY MOUTH IN THE MORNING AND 2 TABLETS AT BEDTIME 05/07/18  Yes [provider]  albuterol (VENTOLIN HFA) 108 (90 Base) MCG/ACT inhaler Inhale 1-2 puffs into the lungs every 6 (six) hours as needed for up to 15 days for wheezing or shortness of breath. 06/11/20 06/26/20  Shirlee Latch, PA-C  methocarbamol (ROBAXIN) 500 MG tablet Take 1 tablet (500 mg total) by mouth 2 (two) times daily. 07/02/20   Becky Augusta, NP  predniSONE (DELTASONE) 10 MG tablet Take 3 tablets on day 1, 2 tablets days 2-5, and 1 tablet day 6 then stop. 07/02/20    Becky Augusta, NP  FLUoxetine (PROZAC) 20 MG tablet TAKE 1 AND ONE-HALF TABLETS (30MG  TOTAL) BY MOUTH DAILY. 05/07/18 04/14/20  [provider]    Family History Family History  Problem Relation Age of Onset  . Cirrhosis Father   . Heart disease Maternal Grandmother     Social History Social History   Tobacco Use  . Smoking status: Former Smoker    Packs/day: 0.50    Years: 16.00    Pack years: 8.00    Types: Cigarettes    Quit date: 10/04/1995    Years since quitting: 24.7  . Smokeless tobacco: Never Used  Vaping Use  . Vaping Use: Never used  Substance Use Topics  . Alcohol use: No  . Drug use: No    Comment: denied all drug/alcohol/tobacco use     Allergies   Patient has no known allergies.   Review of Systems Review of Systems  Constitutional: Positive for activity change. Negative for appetite change and fever.  HENT: Negative for congestion, ear pain and rhinorrhea.   Respiratory: Negative for cough and shortness of breath.   Cardiovascular: Negative for chest pain.  Gastrointestinal: Negative for diarrhea, nausea and vomiting.  Genitourinary: Negative for difficulty urinating, dysuria, flank pain, frequency, urgency, vaginal bleeding and vaginal discharge.  Musculoskeletal: Positive for back pain. Negative for myalgias.  Skin: Negative.   Neurological: Negative for dizziness and weakness.  Hematological: Negative.   Psychiatric/Behavioral: Negative.      Physical Exam Triage Vital Signs ED Triage Vitals  Enc Vitals Group     BP 07/02/20 0958 127/82     Pulse Rate 07/02/20 0958 98     Resp 07/02/20 0958 18     Temp 07/02/20 0958 98.7 F (37.1 C)     Temp Source 07/02/20 0958 Oral     SpO2 07/02/20 0958 99 %     Weight 07/02/20 0954 (!) 306 lb (138.8 kg)     Height 07/02/20 0954 5\' 5"  (1.651 m)     Head Circumference --      Peak Flow --      Pain Score 07/02/20 0954 8     Pain Loc --      Pain Edu? --      Excl. in GC? --    No data  found.  Updated Vital Signs BP 127/82 (BP Location: Right Arm)   Pulse 98   Temp 98.7 F (37.1 C) (Oral)   Resp 18   Ht 5\' 5"  (1.651 m)   Wt (!) 306 lb (138.8 kg)   SpO2 99%   BMI 50.92 kg/m   Visual Acuity Right Eye Distance:   Left Eye Distance:   Bilateral  Distance:    Right Eye Near:   Left Eye Near:    Bilateral Near:     Physical Exam Vitals and nursing note reviewed.  Constitutional:      General: She is not in acute distress.    Appearance: She is obese.     Comments: Patient appears in pain, is tearful with physical exam of left leg.   HENT:     Head: Normocephalic and atraumatic.  Cardiovascular:     Rate and Rhythm: Normal rate and regular rhythm.     Pulses: Normal pulses.     Heart sounds: Normal heart sounds.  Pulmonary:     Effort: Pulmonary effort is normal.     Breath sounds: Normal breath sounds. No wheezing, rhonchi or rales.  Musculoskeletal:        General: Tenderness present.     Cervical back: Normal range of motion and neck supple.     Comments: Patient unable to get on exam table due to pain. Modified MSK exam performed. Pt has pain and spasm in low back, L>R, tenderness in left buttock and hip girdle. Passive ROM of left leg produces increased pain in low back. Quasi-positive straight leg raise.   Lymphadenopathy:     Cervical: Cervical adenopathy present.  Skin:    General: Skin is warm and dry.     Capillary Refill: Capillary refill takes less than 2 seconds.     Findings: No rash.  Neurological:     General: No focal deficit present.     Mental Status: She is alert and oriented to person, place, and time.  Psychiatric:        Mood and Affect: Mood normal.        Behavior: Behavior normal.        Thought Content: Thought content normal.        Judgment: Judgment normal.      UC Treatments / Results  Labs (all labs ordered are listed, but only abnormal results are displayed) Labs Reviewed - No data to  display  EKG   Radiology No results found.  Procedures Procedures (including critical care time)  Medications Ordered in UC Medications - No data to display  Initial Impression / Assessment and Plan / UC Course  I have reviewed the triage vital signs and the nursing notes.  Pertinent labs & imaging results that were available during my care of the patient were reviewed by me and considered in my medical decision making (see chart for details).   Patient is having low back pain without precipitating injury/ Symptoms described are consistent with sciatic nerve impingement- possibly piriformis inflammation.  PE limited due to patient not being able to get on exam table for definitive MSK exam. Will treat with Prednisone and methocarbamol for nerve inflammation and muscle spasm. Will prescribe moist heat and home PT.  Patient is diabetic and her sugars run around 100. I advised the patient to monitor her sugars closely as the prednisone will raise them. If she starts running in the 500's, develops blurry vision, or increased thirst and urination she is to return for re-evaluation. Patient verbalized an understanding.    Final Clinical Impressions(s) / UC Diagnoses   Final diagnoses:  Acute bilateral low back pain with left-sided sciatica     Discharge Instructions     Stop taking Aleve and start prednisone. You will take 3 tablets on day 1, 2 tablets days 2-5, and a single tablet on day 6.  Take the methocarbamol  1000 mg every 6 hours for the next 3 days and then as needed.  Apply moist heat to the area to improve blood flow and aid in healing. You can accomplish this by taking a hand towel, wetting it with hot water, wring it out and apply it to your low back. Place a heating pad over top and leave it in place for 20-30 minutes. Only use water hot enough that you can stand it so as to not get burned.   Monitor yoru sugars closely. The prednisone will cause them to go up,  possibly in the 300's. If they go higher, or you develop blurry vision, increased thirst, or increased urination come back for re-evaluation.  Follow the rehab exercise handout given at discharge.     ED Prescriptions    Medication Sig Dispense Auth. Provider   predniSONE (DELTASONE) 10 MG tablet Take 3 tablets on day 1, 2 tablets days 2-5, and 1 tablet day 6 then stop. 12 tablet Becky Augusta, NP   methocarbamol (ROBAXIN) 500 MG tablet Take 1 tablet (500 mg total) by mouth 2 (two) times daily. 20 tablet Becky Augusta, NP     PDMP not reviewed this encounter.   Becky Augusta, NP 07/02/20 1043

## 2020-07-06 ENCOUNTER — Ambulatory Visit
Admission: EM | Admit: 2020-07-06 | Discharge: 2020-07-06 | Disposition: A | Discharge status: Home / Self Care | Payer: 59 | Attending: Family Medicine | Admitting: Family Medicine

## 2020-07-06 ENCOUNTER — Encounter: Payer: Self-pay | Admitting: Emergency Medicine

## 2020-07-06 ENCOUNTER — Other Ambulatory Visit: Payer: Self-pay

## 2020-07-06 DIAGNOSIS — M545 Low back pain, unspecified: Secondary | ICD-10-CM | POA: Diagnosis not present

## 2020-07-06 MED ORDER — HYDROCODONE-ACETAMINOPHEN 5-325 MG PO TABS: 1.0000 | ORAL_TABLET | Freq: Three times a day (TID) | ORAL | 0 refills | Status: DC | PRN

## 2020-07-06 NOTE — ED Triage Notes (Signed)
Patient c/o lower back pain for about 2 weeks.  Patient states that her pain has not improved since she was seen here on 07/02/20.

## 2020-07-06 NOTE — Discharge Instructions (Signed)
Rest.  Heat.  Medication as needed for pain.  Finish the prednisone and use the muscle relaxant.  If persists, please call [facility] Orthopedics [phone]) OR [facility] [phone]) for an appt.   Take care  Dr. Adriana Simas

## 2020-07-06 NOTE — ED Provider Notes (Signed)
[facility]    CSN: [id number] Arrival date & time: 07/06/20  0841      History   Chief Complaint Chief Complaint  Patient presents with  . Back Pain   HPI   54 year old female presents with ongoing low back pain.  Patient seen here on 9/30.  She was prescribed muscle relaxers and prednisone.  She has not yet finished her course of prednisone.  Patient reports that she is unable to take muscle relaxants as prescribed due to work.  She reports continued low back pain.  Pain 7/10 in severity.  She has had no improvement.  She states that her pain is worse with range of motion.  Predominately located on the left side.  No other reported symptoms.  No other complaints.  Past Medical History:  Diagnosis Date  . Anxiety   . Arthritis    "knees, back, hands" (02/26/2013)  . Depression   . Diabetes mellitus without complication (HCC)   . GERD (gastroesophageal reflux disease)    with Mobic  . Hyperlipidemia   . Hypertension   . IBS (irritable bowel syndrome)   . Menorrhagia   . Migraine headache   . Psoriasis     Patient Active Problem List   Diagnosis Date Noted  . Atypical chest pain 05/31/2018  . SOB (shortness of breath) 05/31/2018  . Lightheadedness 05/31/2018  . Palpitations 05/31/2018  . Morbid obesity (HCC) 05/31/2018  . Plantar fasciitis of left foot 01/26/2018  . Major depression, recurrent, chronic (HCC) 12/02/2015  . Osteoarthritis of left knee 02/26/2013    Class: Diagnosis of    Past Surgical History:  Procedure Laterality Date  . HARDWARE REMOVAL Left 02/26/2013   Procedure: HARDWARE REMOVAL;  Surgeon: Cammy Copa, MD;  Location: [facility] OR;  Service: Orthopedics;  Laterality: Left;  Left Knee Removal of Hardware  . JOINT REPLACEMENT    . KNEE ARTHROPLASTY Left ?2001   "put hardware in" (02/26/2013)  . KNEE ARTHROSCOPY Left    "I've had 2; 2nd one was 2013" (02/26/2013)  . REPLACEMENT TOTAL KNEE Left 02/26/2013   "w/hardware removal"  (02/26/2013)  . TOTAL KNEE ARTHROPLASTY Left 02/26/2013   Procedure: TOTAL KNEE ARTHROPLASTY;  Surgeon: Cammy Copa, MD;  Location: [facility] OR;  Service: Orthopedics;  Laterality: Left;  Left Total Knee Arthroplasty   . TUBAL LIGATION  1988    OB History   No obstetric history on file.      Home Medications    Prior to Admission medications   Medication Sig Start Date End Date Taking? Authorizing Provider  ARIPiprazole (ABILIFY) 5 MG tablet Take 5 mg by mouth daily.   Yes [provider]  clonazePAM (KLONOPIN) 1 MG tablet TAKE 1/2 TO 1 TABLET BY MOUTH TWICE A DAY 01/28/18  Yes [provider]  DULoxetine (CYMBALTA) 20 MG capsule Take 20 mg by mouth daily.   Yes [provider]  escitalopram (LEXAPRO) 20 MG tablet Take 20 mg by mouth daily.   Yes [provider]  gabapentin (NEURONTIN) 300 MG capsule TAKE 1-2 CAPSULES BY MOUTH TWICE DAILY 01/17/18  Yes [provider]  levothyroxine (SYNTHROID, LEVOTHROID) 25 MCG tablet Take 25 mcg by mouth daily. 05/07/18  Yes [provider]  lisinopril (PRINIVIL,ZESTRIL) 20 MG tablet Take 20 mg by mouth daily.  01/09/18  Yes [provider]  metFORMIN (GLUCOPHAGE) 500 MG tablet Take 500 mg by mouth 2 (two) times daily. 12/09/17  Yes [provider]  methocarbamol (ROBAXIN) 500  MG tablet Take 1 tablet (500 mg total) by mouth 2 (two) times daily. 07/02/20  Yes Becky Augusta, NP  oxybutynin (DITROPAN) 5 MG tablet TAKE 1 TABLET BY MOUTH IN THE MORNING AND 2 TABLETS AT BEDTIME 05/07/18  Yes [provider]  predniSONE (DELTASONE) 10 MG tablet Take 3 tablets on day 1, 2 tablets days 2-5, and 1 tablet day 6 then stop. 07/02/20  Yes Becky Augusta, NP  albuterol (VENTOLIN HFA) 108 (90 Base) MCG/ACT inhaler Inhale 1-2 puffs into the lungs every 6 (six) hours as needed for up to 15 days for wheezing or shortness of breath. 06/11/20 06/26/20  Shirlee Latch, PA-C  HYDROcodone-acetaminophen  (NORCO/VICODIN) 5-325 MG tablet Take 1 tablet by mouth every 8 (eight) hours as needed for moderate pain or severe pain. 07/06/20   Crystelle Ferrufino, Verdis Frederickson, DO  FLUoxetine (PROZAC) 20 MG tablet TAKE 1 AND ONE-HALF TABLETS (30MG  TOTAL) BY MOUTH DAILY. 05/07/18 04/14/20  [provider]    Family History Family History  Problem Relation Age of Onset  . Cirrhosis Father   . Heart disease Maternal Grandmother     Social History Social History   Tobacco Use  . Smoking status: Former Smoker    Packs/day: 0.50    Years: 16.00    Pack years: 8.00    Types: Cigarettes    Quit date: 10/04/1995    Years since quitting: 24.7  . Smokeless tobacco: Never Used  Vaping Use  . Vaping Use: Never used  Substance Use Topics  . Alcohol use: No  . Drug use: No    Comment: denied all drug/alcohol/tobacco use     Allergies   Patient has no known allergies.   Review of Systems Review of Systems  Constitutional: Negative.   Musculoskeletal: Positive for back pain.   Physical Exam Triage Vital Signs ED Triage Vitals  Enc Vitals Group     BP 07/06/20 0903 134/89     Pulse Rate 07/06/20 0903 96     Resp 07/06/20 0903 14     Temp 07/06/20 0903 98.4 F (36.9 C)     Temp Source 07/06/20 0903 Oral     SpO2 07/06/20 0903 98 %     Weight 07/06/20 0902 (!) 306 lb (138.8 kg)     Height 07/06/20 0902 5\' 5"  (1.651 m)     Head Circumference --      Peak Flow --      Pain Score 07/06/20 0901 7     Pain Loc --      Pain Edu? --      Excl. in GC? --    Updated Vital Signs BP 134/89 (BP Location: Left Arm)   Pulse 96   Temp 98.4 F (36.9 C) (Oral)   Resp 14   Ht 5\' 5"  (1.651 m)   Wt (!) 138.8 kg   SpO2 98%   BMI 50.92 kg/m   Visual Acuity Right Eye Distance:   Left Eye Distance:   Bilateral Distance:    Right Eye Near:   Left Eye Near:    Bilateral Near:     Physical Exam Vitals and nursing note reviewed.  Constitutional:      General: She is not in acute distress.    Appearance:  Normal appearance. She is obese. She is not ill-appearing.  HENT:     Head: Normocephalic and atraumatic.  Eyes:     General:        Right eye: No discharge.  Left eye: No discharge.     Conjunctiva/sclera: Conjunctivae normal.  Cardiovascular:     Rate and Rhythm: Normal rate and regular rhythm.     Heart sounds: No murmur heard.   Pulmonary:     Effort: Pulmonary effort is normal.     Breath sounds: Normal breath sounds.  Musculoskeletal:     Comments: Exam of the lumbar spine limited as patient cannot get on the exam table.  Patient with mild tenderness of the left paraspinal musculature.  Neurological:     Mental Status: She is alert.  Psychiatric:        Mood and Affect: Mood normal.        Behavior: Behavior normal.    UC Treatments / Results  Labs (all labs ordered are listed, but only abnormal results are displayed) Labs Reviewed - No data to display  EKG   Radiology No results found.  Procedures Procedures (including critical care time)  Medications Ordered in UC Medications - No data to display  Initial Impression / Assessment and Plan / UC Course  I have reviewed the triage vital signs and the nursing notes.  Pertinent labs & imaging results that were available during my care of the patient were reviewed by me and considered in my medical decision making (see chart for details).    54 year old female presents with continued/persistent low back pain.  Advised to come pleat her course of prednisone.  Muscle relaxants as prescribed.  Work note given.  Treating pain with hydrocodone. [place] Controlled substance database reviewed.  Needs follow-up with orthopedics.  Final Clinical Impressions(s) / UC Diagnoses   Final diagnoses:  Acute left-sided low back pain without sciatica     Discharge Instructions     Rest.  Heat.  Medication as needed for pain.  Finish the prednisone and use the muscle relaxant.  If persists, please call [facility]  cl[facility] Orthopedics [phone]) OR [facility] [phone]) for an appt.   Take care  Dr. Adriana Simas     ED Prescriptions    Medication Sig Dispense Auth. Provider   HYDROcodone-acetaminophen (NORCO/VICODIN) 5-325 MG tablet Take 1 tablet by mouth every 8 (eight) hours as needed for moderate pain or severe pain. 10 tablet Everlene Other G, DO     I have reviewed the PDMP during this encounter.   Tommie Sams, [place] 07/06/20 2027

## 2020-07-18 ENCOUNTER — Other Ambulatory Visit: Payer: Self-pay

## 2020-07-18 ENCOUNTER — Encounter: Payer: Self-pay | Admitting: Emergency Medicine

## 2020-07-18 ENCOUNTER — Ambulatory Visit
Admission: EM | Admit: 2020-07-18 | Discharge: 2020-07-18 | Disposition: A | Discharge status: Home / Self Care | Payer: 59 | Attending: Physician Assistant | Admitting: Physician Assistant

## 2020-07-18 DIAGNOSIS — M5442 Lumbago with sciatica, left side: Secondary | ICD-10-CM | POA: Diagnosis not present

## 2020-07-18 MED ORDER — DICLOFENAC SODIUM 75 MG PO TBEC: 75.0000 mg | DELAYED_RELEASE_TABLET | Freq: Two times a day (BID) | ORAL | 0 refills | Status: AC

## 2020-07-18 MED ORDER — BACLOFEN 10 MG PO TABS: 10.0000 mg | ORAL_TABLET | Freq: Three times a day (TID) | ORAL | 0 refills | Status: AC

## 2020-07-18 MED ORDER — KETOROLAC TROMETHAMINE 60 MG/2ML IM SOLN
60.0000 mg | Freq: Once | INTRAMUSCULAR | Status: AC
  Administered 2020-07-18: 60 mg via INTRAMUSCULAR

## 2020-07-18 NOTE — ED Provider Notes (Signed)
[facility]    CSN: [id number] Arrival date & time: 07/18/20  1409      History   Chief Complaint Chief Complaint  Patient presents with  . Back Pain    HPI Maria Hancock is a 54 y.o. female presenting for a 1 month history of back pain that has not improved.  She states that the back pain is constant in the center and on the left side.  It is aching and occasionally has sharp radiating pains down the left leg all the way to the foot.  He says she already has peripheral neuropathy and is not sure if her back condition has been associated with any worsening symptoms.  Patient says that she has been seen multiple times for her back problem including on 07/02/2020 and 07/06/2020 at [facility] urgent care.  Patient says she has tried multiple modalities including heat, ice, NSAIDs, muscle relaxers, hydrocodone, stretches and over-the-counter diclofenac gel.  She says that nothing has helped.  She denies ever injuring the back and says she has not had any problems with her back before.  Patient says she has not been anywhere else for the condition other than urgent care.  Patient denies any worsening of symptoms, but says that she has not gotten any better.  Denies saddle anesthesia, leg weakness, loss of bowel or bladder control.  Nothing seems to really make the pain better or worse.  She has no other complaints or concerns today.  HPI  Past Medical History:  Diagnosis Date  . Anxiety   . Arthritis    "knees, back, hands" (02/26/2013)  . Depression   . Diabetes mellitus without complication (HCC)   . GERD (gastroesophageal reflux disease)    with Mobic  . Hyperlipidemia   . Hypertension   . IBS (irritable bowel syndrome)   . Menorrhagia   . Migraine headache   . Psoriasis     Patient Active Problem List   Diagnosis Date Noted  . Atypical chest pain 05/31/2018  . SOB (shortness of breath) 05/31/2018  . Lightheadedness 05/31/2018  . Palpitations 05/31/2018  . Morbid  obesity (HCC) 05/31/2018  . Plantar fasciitis of left foot 01/26/2018  . Major depression, recurrent, chronic (HCC) 12/02/2015  . Osteoarthritis of left knee 02/26/2013    Class: Diagnosis of    Past Surgical History:  Procedure Laterality Date  . HARDWARE REMOVAL Left 02/26/2013   Procedure: HARDWARE REMOVAL;  Surgeon: Cammy Copa, MD;  Location: [facility] OR;  Service: Orthopedics;  Laterality: Left;  Left Knee Removal of Hardware  . JOINT REPLACEMENT    . KNEE ARTHROPLASTY Left ?2001   "put hardware in" (02/26/2013)  . KNEE ARTHROSCOPY Left    "I've had 2; 2nd one was 2013" (02/26/2013)  . REPLACEMENT TOTAL KNEE Left 02/26/2013   "w/hardware removal" (02/26/2013)  . TOTAL KNEE ARTHROPLASTY Left 02/26/2013   Procedure: TOTAL KNEE ARTHROPLASTY;  Surgeon: Cammy Copa, MD;  Location: [facility] OR;  Service: Orthopedics;  Laterality: Left;  Left Total Knee Arthroplasty   . TUBAL LIGATION  1988    OB History   No obstetric history on file.      Home Medications    Prior to Admission medications   Medication Sig Start Date End Date Taking? Authorizing Provider  ARIPiprazole (ABILIFY) 5 MG tablet Take 5 mg by mouth daily.   Yes [provider]  clonazePAM (KLONOPIN) 1 MG tablet TAKE 1/2 TO 1 TABLET BY MOUTH TWICE A DAY 01/28/18  Yes  [provider]  DULoxetine (CYMBALTA) 20 MG capsule Take 20 mg by mouth daily.   Yes [provider]  escitalopram (LEXAPRO) 20 MG tablet Take 20 mg by mouth daily.   Yes [provider]  gabapentin (NEURONTIN) 300 MG capsule TAKE 1-2 CAPSULES BY MOUTH TWICE DAILY 01/17/18  Yes [provider]  HYDROcodone-acetaminophen (NORCO/VICODIN) 5-325 MG tablet Take 1 tablet by mouth every 8 (eight) hours as needed for moderate pain or severe pain. 07/06/20  Yes Cook, Jayce G, DO  levothyroxine (SYNTHROID, LEVOTHROID) 25 MCG tablet Take 25 mcg by mouth daily. 05/07/18  Yes [provider]  lisinopril (PRINIVIL,ZESTRIL)  20 MG tablet Take 20 mg by mouth daily.  01/09/18  Yes [provider]  metFORMIN (GLUCOPHAGE) 500 MG tablet Take 500 mg by mouth 2 (two) times daily. 12/09/17  Yes [provider]  oxybutynin (DITROPAN) 5 MG tablet TAKE 1 TABLET BY MOUTH IN THE MORNING AND 2 TABLETS AT BEDTIME 05/07/18  Yes [provider]  albuterol (VENTOLIN HFA) 108 (90 Base) MCG/ACT inhaler Inhale 1-2 puffs into the lungs every 6 (six) hours as needed for up to 15 days for wheezing or shortness of breath. 06/11/20 06/26/20  Eusebio Friendly B, PA-C  baclofen (LIORESAL) 10 MG tablet Take 1 tablet (10 mg total) by mouth 3 (three) times daily for 15 days. 07/18/20 08/02/20  Shirlee Latch, PA-C  diclofenac (VOLTAREN) 75 MG EC tablet Take 1 tablet (75 mg total) by mouth 2 (two) times daily. 07/18/20 08/17/20  Eusebio Friendly B, PA-C  FLUoxetine (PROZAC) 20 MG tablet TAKE 1 AND ONE-HALF TABLETS (30MG  TOTAL) BY MOUTH DAILY. 05/07/18 04/14/20  [provider]    Family History Family History  Problem Relation Age of Onset  . Cirrhosis Father   . Heart disease Maternal Grandmother     Social History Social History   Tobacco Use  . Smoking status: Former Smoker    Packs/day: 0.50    Years: 16.00    Pack years: 8.00    Types: Cigarettes    Quit date: 10/04/1995    Years since quitting: 24.8  . Smokeless tobacco: Never Used  Vaping Use  . Vaping Use: Never used  Substance Use Topics  . Alcohol use: No  . Drug use: No    Comment: denied all drug/alcohol/tobacco use     Allergies   Patient has no known allergies.   Review of Systems Review of Systems  Constitutional: Negative for fatigue and fever.  Genitourinary: Negative for dysuria, flank pain, frequency and hematuria.  Musculoskeletal: Positive for back pain. Negative for arthralgias, gait problem and joint swelling.  Skin: Negative for rash and wound.  Neurological: Positive for numbness. Negative for weakness.     Physical  Exam Triage Vital Signs ED Triage Vitals  Enc Vitals Group     BP 07/18/20 1425 (!) 144/105     Pulse Rate 07/18/20 1425 (!) 105     Resp 07/18/20 1425 14     Temp 07/18/20 1425 98.6 F (37 C)     Temp Source 07/18/20 1425 Oral     SpO2 07/18/20 1425 97 %     Weight 07/18/20 1422 300 lb (136.1 kg)     Height 07/18/20 1422 5\' 5"  (1.651 m)     Head Circumference --      Peak Flow --      Pain Score 07/18/20 1422 6     Pain Loc --      Pain  Edu? --      Excl. in GC? --    No data found.  Updated Vital Signs BP (!) 144/105 (BP Location: Left Arm)   Pulse (!) 105   Temp 98.6 F (37 C) (Oral)   Resp 14   Ht 5\' 5"  (1.651 m)   Wt 300 lb (136.1 kg)   SpO2 97%   BMI 49.92 kg/m       Physical Exam Vitals and nursing note reviewed.  Constitutional:      General: She is not in acute distress.    Appearance: Normal appearance. She is obese. She is not ill-appearing or toxic-appearing.  HENT:     Head: Normocephalic and atraumatic.  Eyes:     General: No scleral icterus.       Right eye: No discharge.        Left eye: No discharge.     Conjunctiva/sclera: Conjunctivae normal.  Cardiovascular:     Rate and Rhythm: Normal rate and regular rhythm.     Heart sounds: Normal heart sounds.  Pulmonary:     Effort: Pulmonary effort is normal. No respiratory distress.     Breath sounds: Normal breath sounds.  Musculoskeletal:     Cervical back: Neck supple.     Lumbar back: Tenderness (diffuse TTP L3-S1 and left paravertebral lumbar muscles) present. Decreased range of motion (decreased ROM in all directions seemingly due to pain and inability to cooperate). Positive left straight leg raise test. Negative right straight leg raise test.     Comments: 5/5 strength bilat LEs  Skin:    General: Skin is dry.  Neurological:     General: No focal deficit present.     Mental Status: She is alert. Mental status is at baseline.     Motor: No weakness.     Gait: Gait normal.   Psychiatric:        Mood and Affect: Mood normal.        Behavior: Behavior normal.        Thought Content: Thought content normal.      UC Treatments / Results  Labs (all labs ordered are listed, but only abnormal results are displayed) Labs Reviewed - No data to display  EKG   Radiology No results found.  Procedures Procedures (including critical care time)  Medications Ordered in UC Medications  ketorolac (TORADOL) injection 60 mg (60 mg Intramuscular Given 07/18/20 1458)    Initial Impression / Assessment and Plan / UC Course  I have reviewed the triage vital signs and the nursing notes.  Pertinent labs & imaging results that were available during my care of the patient were reviewed by me and considered in my medical decision making (see chart for details).   54 year old female returning to [facility] urgent care for the third time for left-sided back pain in the past 3 weeks.  Patient previously advised to follow-up with orthopedics if not feeling better.  Patient says that she has not been able to follow-up with orthopedics and did not know that she was supposed to.  Advised patient that at this time her pain has been going on for a month and she has not had any improvement so she really does need to follow-up with orthopedics as she will likely need an MRI to determine the exact cause of her back pain.  DDx bulging disc, herniated disc, spinal stenosis, osteoarthritis and bone spurs, lumbar strain, malignancy, other derangement of spine.  At this time advised patient to try  diclofenac, Tylenol, heat, ice and baclofen as needed for muscle spasms.  Advised her to perform stretches and try not to be on bedrest as that can make things worse.  ED precautions discussed with her, but I did advise her to follow-up sometime next week with Ortho as there is not much more we can do in the urgent care setting and she does not need an MRI likely.  Patient says she is understanding and  agreeable.   Final Clinical Impressions(s) / UC Diagnoses   Final diagnoses:  Acute left-sided low back pain with left-sided sciatica     Discharge Instructions     BACK PAIN: Stressed avoiding painful activities . RICE (REST, ICE, COMPRESSION, ELEVATION) guidelines reviewed. May alternate ice and heat. Consider use of muscle rubs, Salonpas patches, etc. Use medications as directed including muscle relaxers if prescribed. Take anti-inflammatory medications as prescribed or OTC NSAIDs/Tylenol.  F/u with PCP in 7-10 days for reexamination, and please feel free to call or return to the urgent care at any time for any questions or concerns you may have and we will be happy to help you!   BACK PAIN RED FLAGS: If the back pain acutely worsens or there are any red flag symptoms such as numbness/tingling, leg weakness, saddle anesthesia, or loss of bowel/bladder control, go immediately to the ER. Follow up with us as scheduled or sooner if the pain does not begin to resolve or if it worsens before the follow up    You have a condition requiring you to follow up with Orthopedics so please call one of the following office for appointment:   [facility] [address], [place], [place] [address] Phone: [phone]  [facility] [address], [place], [place] [address] Phone: [phone]     ED Prescriptions    Medication Sig Dispense Auth. Provider   baclofen (LIORESAL) 10 MG tablet Take 1 tablet (10 mg total) by mouth 3 (three) times daily for 15 days. 45 tablet Eusebio FriendlyEaves, Terryn Redner B, PA-C   diclofenac (VOLTAREN) 75 MG EC tablet Take 1 tablet (75 mg total) by mouth 2 (two) times daily. 60 tablet Gareth MorganEaves, Dimitrious Micciche B, PA-C     PDMP not reviewed this encounter.   Shirlee LatchEaves, Kaitland Lewellyn B, PA-C 07/19/20 [phone]

## 2020-07-18 NOTE — Discharge Instructions (Addendum)
BACK PAIN: Stressed avoiding painful activities . RICE (REST, ICE, COMPRESSION, ELEVATION) guidelines reviewed. May alternate ice and heat. Consider use of muscle rubs, Salonpas patches, etc. Use medications as directed including muscle relaxers if prescribed. Take anti-inflammatory medications as prescribed or OTC NSAIDs/Tylenol.  F/u with PCP in 7-10 days for reexamination, and please feel free to call or return to the urgent care at any time for any questions or concerns you may have and we will be happy to help you!   BACK PAIN RED FLAGS: If the back pain acutely worsens or there are any red flag symptoms such as numbness/tingling, leg weakness, saddle anesthesia, or loss of bowel/bladder control, go immediately to the ER. Follow up with us as scheduled or sooner if the pain does not begin to resolve or if it worsens before the follow up    You have a condition requiring you to follow up with Orthopedics so please call one of the following office for appointment:   [facility] [address], [place], [place] [address] Phone: [phone]  [facility] [address], [place], [place] [address] Phone: [phone]  

## 2020-07-18 NOTE — ED Triage Notes (Signed)
Patient c/o ongoing left lower back pain.  Patient has been here for her back pain on 07/02/20 and 07/06/20.  Patient denies any new injury or fall.

## 2020-10-09 DIAGNOSIS — R0789 Other chest pain: Secondary | ICD-10-CM | POA: Insufficient documentation

## 2020-11-21 IMAGING — CR DG CHEST 2V
2 series · 2 of 2 positions shown · non-contrast
Comparison: May 23, 2018.

CLINICAL DATA: Shortness of breath.

EXAM:
CHEST - 2 VIEW

[chest pa]
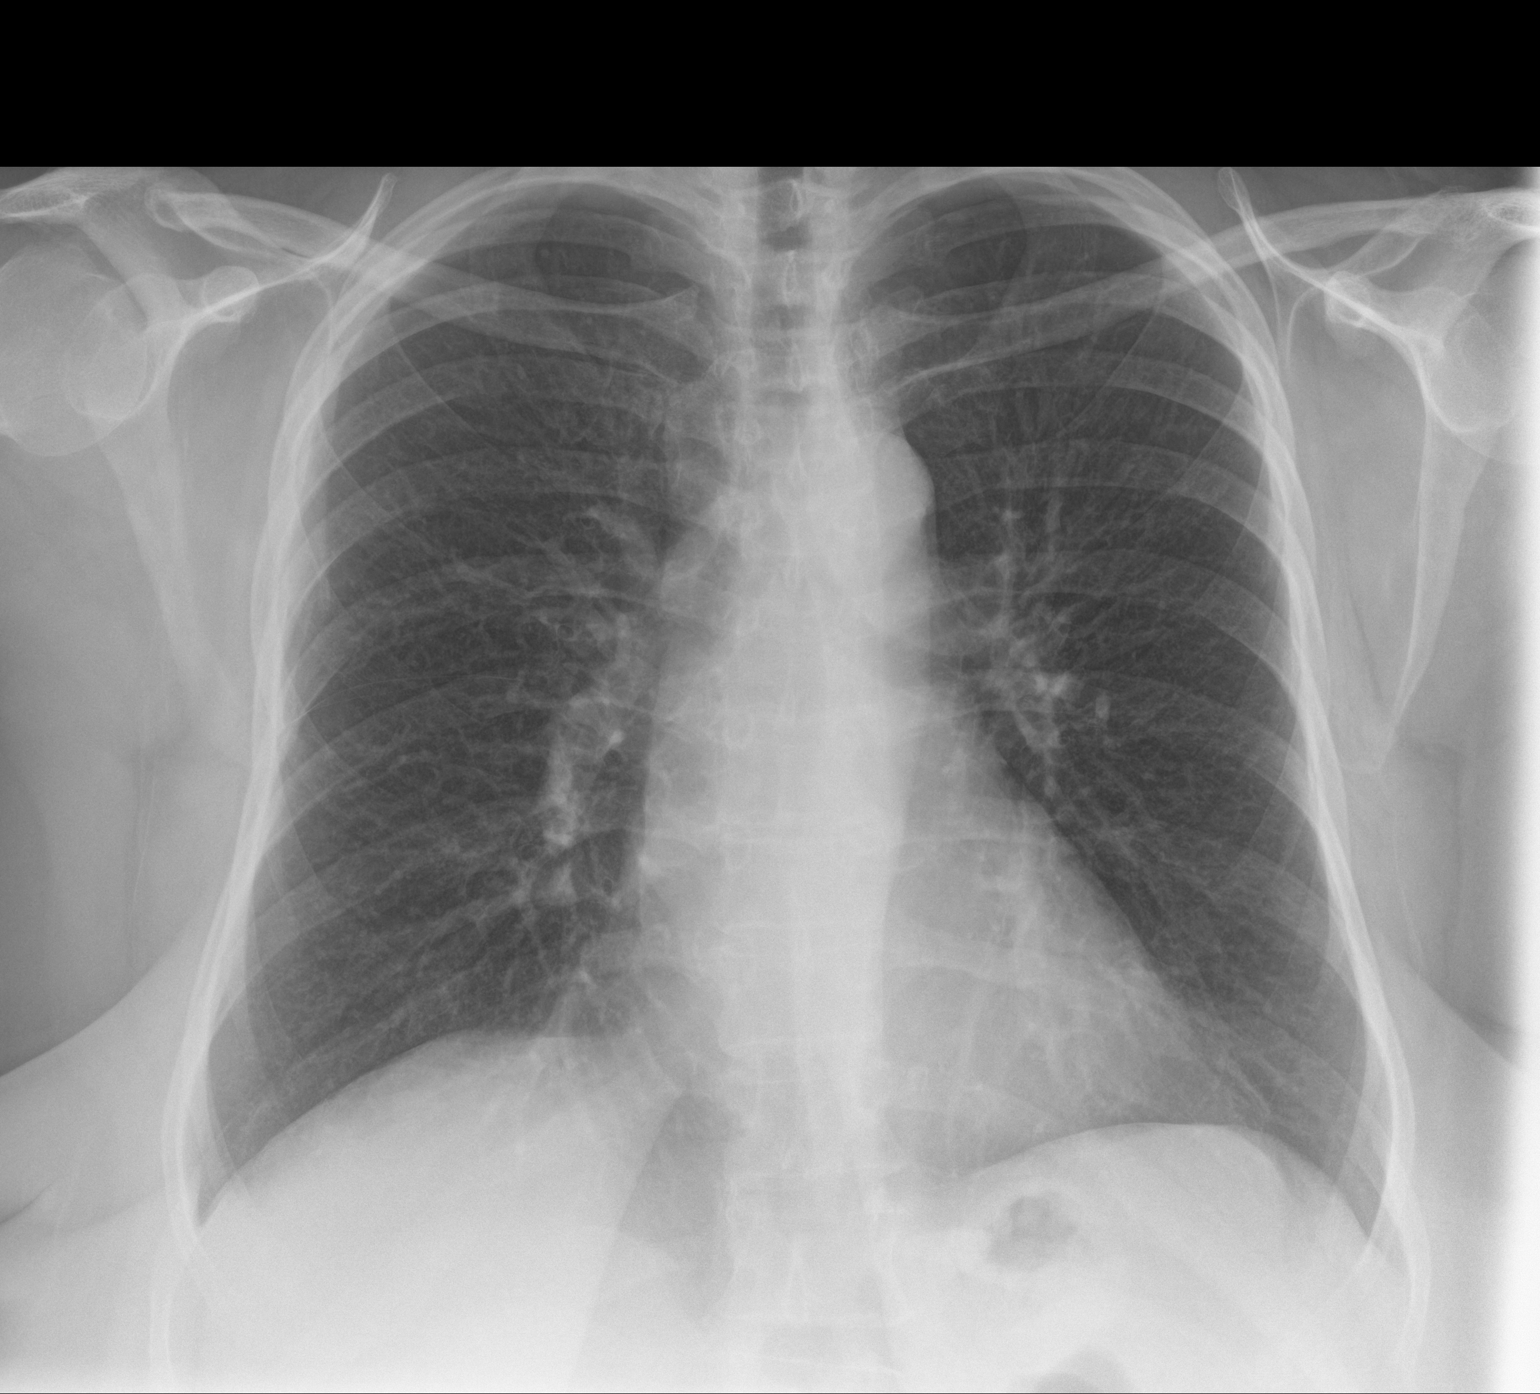

[chest lat]
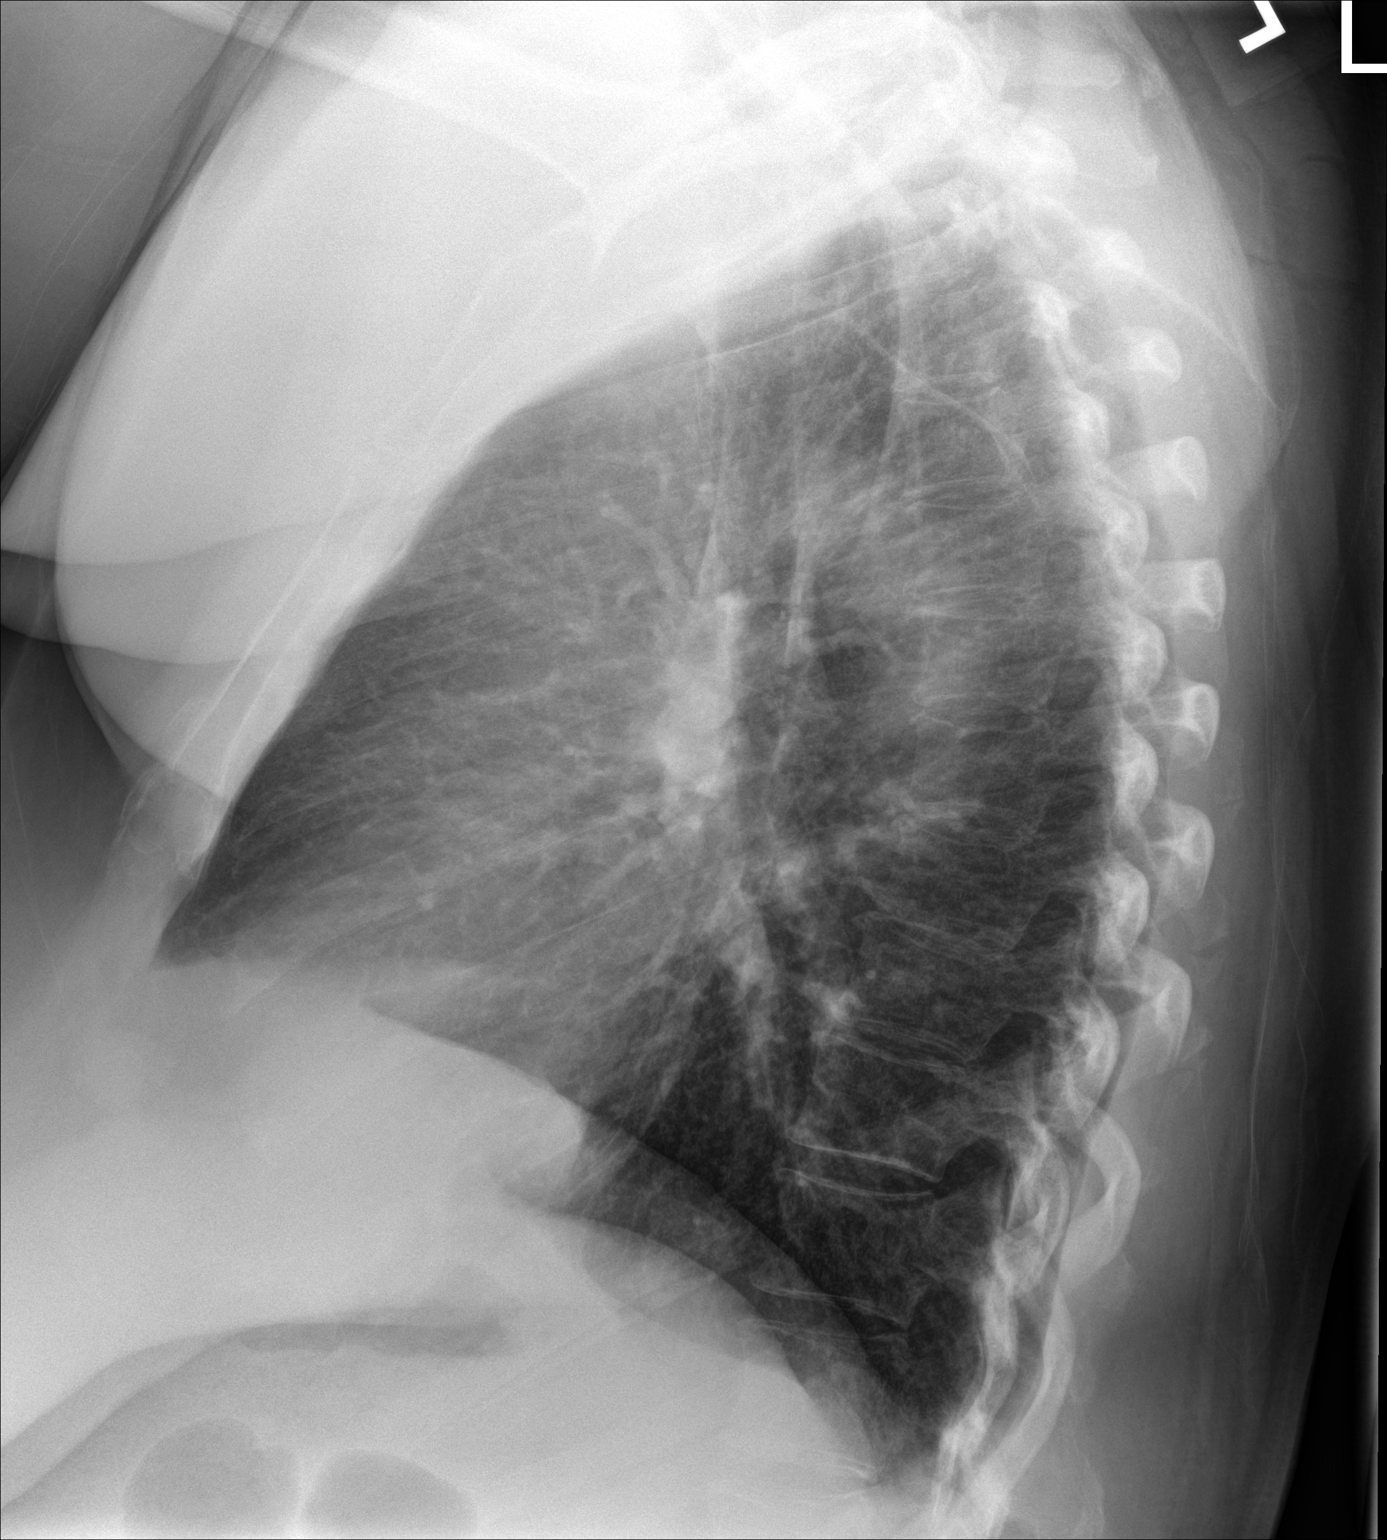

[2 of 2 positions shown; findings below may reference images not displayed]

FINDINGS: The heart size and mediastinal contours are within normal limits.
Both lungs are clear. No pneumothorax or pleural effusion is noted.
The visualized skeletal structures are unremarkable.
IMPRESSION: No active cardiopulmonary disease.

## 2021-01-08 DIAGNOSIS — E119 Type 2 diabetes mellitus without complications: Secondary | ICD-10-CM | POA: Insufficient documentation

## 2021-01-08 DIAGNOSIS — I10 Essential (primary) hypertension: Secondary | ICD-10-CM | POA: Insufficient documentation

## 2021-01-08 DIAGNOSIS — G629 Polyneuropathy, unspecified: Secondary | ICD-10-CM | POA: Insufficient documentation

## 2021-01-14 ENCOUNTER — Other Ambulatory Visit: Payer: Self-pay

## 2021-01-14 ENCOUNTER — Ambulatory Visit (INDEPENDENT_AMBULATORY_CARE_PROVIDER_SITE_OTHER): Payer: 59

## 2021-01-14 ENCOUNTER — Ambulatory Visit
Admission: EM | Admit: 2021-01-14 | Discharge: 2021-01-14 | Disposition: A | Discharge status: Home / Self Care | Payer: 59 | Attending: Family Medicine | Admitting: Family Medicine

## 2021-01-14 DIAGNOSIS — R059 Cough, unspecified: Secondary | ICD-10-CM | POA: Diagnosis not present

## 2021-01-14 DIAGNOSIS — J988 Other specified respiratory disorders: Secondary | ICD-10-CM

## 2021-01-14 DIAGNOSIS — R0602 Shortness of breath: Secondary | ICD-10-CM | POA: Diagnosis not present

## 2021-01-14 MED ORDER — PREDNISONE 50 MG PO TABS: ORAL_TABLET | ORAL | 0 refills | Status: DC

## 2021-01-14 MED ORDER — PROMETHAZINE-DM 6.25-15 MG/5ML PO SYRP: 5.0000 mL | ORAL_SOLUTION | Freq: Four times a day (QID) | ORAL | 0 refills | Status: DC | PRN

## 2021-01-14 MED ORDER — ALBUTEROL SULFATE HFA 108 (90 BASE) MCG/ACT IN AERS: 1.0000 | INHALATION_SPRAY | Freq: Four times a day (QID) | RESPIRATORY_TRACT | 1 refills | Status: DC | PRN

## 2021-01-14 NOTE — ED Provider Notes (Signed)
[facility]    CSN: [id number] Arrival date & time: 01/14/21  1040      History   Chief Complaint Chief Complaint  Patient presents with  . Cough  . Shortness of Breath   HPI 55 year old female presents with the above complaints.  Patient reports a 2-day history of cough.  She states that her cough is "wet".  She states that it is productive of green sputum.  She states that she got up this morning and felt short of breath.  She states that she has been lightheaded.  No fever.  No sick contacts.  No relieving factors.  No other complaints.  Past Medical History:  Diagnosis Date  . Anxiety   . Arthritis    "knees, back, hands" (02/26/2013)  . Depression   . Diabetes mellitus without complication (HCC)   . GERD (gastroesophageal reflux disease)    with Mobic  . Hyperlipidemia   . Hypertension   . IBS (irritable bowel syndrome)   . Menorrhagia   . Migraine headache   . Psoriasis     Patient Active Problem List   Diagnosis Date Noted  . Atypical chest pain 05/31/2018  . SOB (shortness of breath) 05/31/2018  . Lightheadedness 05/31/2018  . Palpitations 05/31/2018  . Morbid obesity (HCC) 05/31/2018  . Plantar fasciitis of left foot 01/26/2018  . Major depression, recurrent, chronic (HCC) 12/02/2015  . Osteoarthritis of left knee 02/26/2013    Class: Diagnosis of    Past Surgical History:  Procedure Laterality Date  . HARDWARE REMOVAL Left 02/26/2013   Procedure: HARDWARE REMOVAL;  Surgeon: Cammy Copa, MD;  Location: [facility] OR;  Service: Orthopedics;  Laterality: Left;  Left Knee Removal of Hardware  . JOINT REPLACEMENT    . KNEE ARTHROPLASTY Left ?2001   "put hardware in" (02/26/2013)  . KNEE ARTHROSCOPY Left    "I've had 2; 2nd one was 2013" (02/26/2013)  . REPLACEMENT TOTAL KNEE Left 02/26/2013   "w/hardware removal" (02/26/2013)  . TOTAL KNEE ARTHROPLASTY Left 02/26/2013   Procedure: TOTAL KNEE ARTHROPLASTY;  Surgeon: Cammy Copa, MD;   Location: [facility] OR;  Service: Orthopedics;  Laterality: Left;  Left Total Knee Arthroplasty   . TUBAL LIGATION  1988    OB History   No obstetric history on file.      Home Medications    Prior to Admission medications   Medication Sig Start Date End Date Taking? Authorizing Provider  albuterol (VENTOLIN HFA) 108 (90 Base) MCG/ACT inhaler Inhale 1-2 puffs into the lungs every 6 (six) hours as needed for wheezing or shortness of breath. 01/14/21  Yes Ellamae Lybeck G, DO  predniSONE (DELTASONE) 50 MG tablet 1 tablet daily x 5 days 01/14/21  Yes Helton Oleson G, DO  promethazine-dextromethorphan (PROMETHAZINE-DM) 6.25-15 MG/5ML syrup Take 5 mLs by mouth 4 (four) times daily as needed for cough. 01/14/21  Yes Jullien Granquist G, DO  ARIPiprazole (ABILIFY) 5 MG tablet Take 5 mg by mouth daily.    [provider]  clonazePAM (KLONOPIN) 1 MG tablet TAKE 1/2 TO 1 TABLET BY MOUTH TWICE A DAY 01/28/18   [provider]  DULoxetine (CYMBALTA) 20 MG capsule Take 20 mg by mouth daily.    [provider]  escitalopram (LEXAPRO) 20 MG tablet Take 20 mg by mouth daily.    [provider]  gabapentin (NEURONTIN) 300 MG capsule TAKE 1-2 CAPSULES BY MOUTH TWICE DAILY 01/17/18   [provider]  levothyroxine (SYNTHROID, LEVOTHROID) 25 MCG  tablet Take 25 mcg by mouth daily. 05/07/18   [provider]  metFORMIN (GLUCOPHAGE) 500 MG tablet Take 500 mg by mouth 2 (two) times daily. 12/09/17   [provider]  oxybutynin (DITROPAN) 5 MG tablet TAKE 1 TABLET BY MOUTH IN THE MORNING AND 2 TABLETS AT BEDTIME 05/07/18   [provider]  FLUoxetine (PROZAC) 20 MG tablet TAKE 1 AND ONE-HALF TABLETS (30MG  TOTAL) BY MOUTH DAILY. 05/07/18 04/14/20  [provider]  lisinopril (PRINIVIL,ZESTRIL) 20 MG tablet Take 20 mg by mouth daily.  01/09/18 01/14/21  [provider]    Family History Family History  Problem Relation Age of Onset  . Cirrhosis Father   .  Heart disease Maternal Grandmother     Social History Social History   Tobacco Use  . Smoking status: Former Smoker    Packs/day: 0.50    Years: 16.00    Pack years: 8.00    Types: Cigarettes    Quit date: 10/04/1995    Years since quitting: 25.2  . Smokeless tobacco: Never Used  Vaping Use  . Vaping Use: Never used  Substance Use Topics  . Alcohol use: No  . Drug use: No    Comment: denied all drug/alcohol/tobacco use     Allergies   Patient has no known allergies.   Review of Systems Review of Systems  Constitutional: Negative for fever.  Respiratory: Positive for cough and shortness of breath.    Physical Exam Triage Vital Signs ED Triage Vitals [01/14/21 1051]  Enc Vitals Group     BP 117/78     Pulse Rate (!) 102     Resp (!) 21     Temp 98.2 F (36.8 C)     Temp Source Oral     SpO2 95 %     Weight (!) 321 lb (145.6 kg)     Height 5\' 5"  (1.651 m)     Head Circumference      Peak Flow      Pain Score 0     Pain Loc      Pain Edu?      Excl. in GC?    Updated Vital Signs BP 117/78 (BP Location: Left Arm)   Pulse (!) 102   Temp 98.2 F (36.8 C) (Oral)   Resp (!) 21   Ht 5\' 5"  (1.651 m)   Wt (!) 145.6 kg   SpO2 95%   BMI 53.42 kg/m   Visual Acuity Right Eye Distance:   Left Eye Distance:   Bilateral Distance:    Right Eye Near:   Left Eye Near:    Bilateral Near:     Physical Exam Vitals and nursing note reviewed.  Constitutional:      General: She is not in acute distress.    Appearance: She is well-developed. She is obese.  HENT:     Head: Normocephalic and atraumatic.  Eyes:     General:        Right eye: No discharge.        Left eye: No discharge.     Conjunctiva/sclera: Conjunctivae normal.  Cardiovascular:     Rate and Rhythm: Regular rhythm. Tachycardia present.  Pulmonary:     Breath sounds: No wheezing or rales.     Comments: Mild tachypnea. Neurological:     Mental Status: She is alert.  Psychiatric:      Comments: Flat affect.    UC Treatments / Results  Labs (all labs ordered are  listed, but only abnormal results are displayed) Labs Reviewed - No data to display  EKG   Radiology DG Chest 2 View  Result Date: 01/14/2021 CLINICAL DATA:  Cough, shortness of breath EXAM: CHEST - 2 VIEW COMPARISON:  06/11/2020 FINDINGS: Heart and mediastinal contours are within normal limits. No focal opacities or effusions. No acute bony abnormality. IMPRESSION: No active cardiopulmonary disease. Electronically Signed   By: Charlett Nose M.D.   On: 01/14/2021 11:55    Procedures Procedures (including critical care time)  Medications Ordered in UC Medications - No data to display  Initial Impression / Assessment and Plan / UC Course  I have reviewed the triage vital signs and the nursing notes.  Pertinent labs & imaging results that were available during my care of the patient were reviewed by me and considered in my medical decision making (see chart for details).    55 year old female presents with acute respiratory infection.  Chest x-ray was obtained and was independently by me.  Interpretation: No acute Edavally.  No evidence of pneumonia.  Treating with albuterol, prednisone, Promethazine DM.  Work note given.  Final Clinical Impressions(s) / UC Diagnoses   Final diagnoses:  Respiratory infection     Discharge Instructions     Rest. Fluids.  Medication as prescribed.  If you worsen, go to the hospital.  Take care  Dr. Adriana Simas     ED Prescriptions    Medication Sig Dispense Auth. Provider   albuterol (VENTOLIN HFA) 108 (90 Base) MCG/ACT inhaler Inhale 1-2 puffs into the lungs every 6 (six) hours as needed for wheezing or shortness of breath. 18 g Maire Govan G, DO   predniSONE (DELTASONE) 50 MG tablet 1 tablet daily x 5 days 5 tablet Shelsy Seng G, DO   promethazine-dextromethorphan (PROMETHAZINE-DM) 6.25-15 MG/5ML syrup Take 5 mLs by mouth 4 (four) times daily as needed for  cough. 118 mL Tommie Sams, DO     PDMP not reviewed this encounter.   Tommie Sams, DO 01/14/21 1210

## 2021-01-14 NOTE — Discharge Instructions (Signed)
Rest. Fluids.  Medication as prescribed.  If you worsen, go to the hospital.  Take care  Dr. Adriana Simas

## 2021-01-14 NOTE — ED Triage Notes (Signed)
Pt states 2 days ago started with a "wet cough" productive of green sputum. Today awoke and felt lighteaded and shortness of breath.

## 2021-04-27 ENCOUNTER — Ambulatory Visit
Admission: EM | Admit: 2021-04-27 | Discharge: 2021-04-27 | Disposition: A | Discharge status: Home / Self Care | Payer: 59 | Attending: Sports Medicine | Admitting: Sports Medicine

## 2021-04-27 ENCOUNTER — Encounter: Payer: Self-pay | Admitting: Emergency Medicine

## 2021-04-27 ENCOUNTER — Other Ambulatory Visit: Payer: Self-pay

## 2021-04-27 DIAGNOSIS — R112 Nausea with vomiting, unspecified: Secondary | ICD-10-CM | POA: Diagnosis not present

## 2021-04-27 LAB — URINALYSIS, COMPLETE (UACMP) WITH MICROSCOPIC
Bilirubin Urine: NEGATIVE
Glucose, UA: 500 mg/dL — AB
Hgb urine dipstick: NEGATIVE
Ketones, ur: NEGATIVE mg/dL
Leukocytes,Ua: NEGATIVE
Nitrite: NEGATIVE
Protein, ur: NEGATIVE mg/dL
Specific Gravity, Urine: 1.02 (ref 1.005–1.030)
pH: 6.5 (ref 5.0–8.0)

## 2021-04-27 LAB — GLUCOSE, CAPILLARY: Glucose-Capillary: 228 mg/dL — ABNORMAL HIGH (ref 70–99)

## 2021-04-27 MED ORDER — ONDANSETRON 8 MG PO TBDP: 8.0000 mg | ORAL_TABLET | Freq: Three times a day (TID) | ORAL | 0 refills | Status: DC | PRN

## 2021-04-27 NOTE — ED Provider Notes (Signed)
[facility]    CSN: [id number] Arrival date & time: 04/27/21  1049      History   Chief Complaint Chief Complaint  Patient presents with   Emesis    HPI Maria Hancock is a 55 y.o. female.   HPI  55 year old female here for evaluation of vomiting.  Patient reports that she has had for persistent vomiting since 3:00 yesterday afternoon.  She has constant nausea associated with this and some mild nasal congestion but she denies abdominal pain, diarrhea, fever, blood in her vomit, eating anything that she consider suspect, or change in her medications.  Past Medical History:  Diagnosis Date   Anxiety    Arthritis    "knees, back, hands" (02/26/2013)   Depression    Diabetes mellitus without complication (HCC)    GERD (gastroesophageal reflux disease)    with Mobic   Hyperlipidemia    Hypertension    IBS (irritable bowel syndrome)    Menorrhagia    Migraine headache    Psoriasis     Patient Active Problem List   Diagnosis Date Noted   Atypical chest pain 05/31/2018   SOB (shortness of breath) 05/31/2018   Lightheadedness 05/31/2018   Palpitations 05/31/2018   Morbid obesity (HCC) 05/31/2018   Plantar fasciitis of left foot 01/26/2018   Major depression, recurrent, chronic (HCC) 12/02/2015   Osteoarthritis of left knee 02/26/2013    Class: Diagnosis of    Past Surgical History:  Procedure Laterality Date   HARDWARE REMOVAL Left 02/26/2013   Procedure: HARDWARE REMOVAL;  Surgeon: Cammy Copa, MD;  Location: [facility] OR;  Service: Orthopedics;  Laterality: Left;  Left Knee Removal of Hardware   JOINT REPLACEMENT     KNEE ARTHROPLASTY Left ?2001   "put hardware in" (02/26/2013)   KNEE ARTHROSCOPY Left    "I've had 2; 2nd one was 2013" (02/26/2013)   REPLACEMENT TOTAL KNEE Left 02/26/2013   "w/hardware removal" (02/26/2013)   TOTAL KNEE ARTHROPLASTY Left 02/26/2013   Procedure: TOTAL KNEE ARTHROPLASTY;  Surgeon: Cammy Copa, MD;  Location: [facility]  OR;  Service: Orthopedics;  Laterality: Left;  Left Total Knee Arthroplasty    TUBAL LIGATION  1988    OB History   No obstetric history on file.      Home Medications    Prior to Admission medications   Medication Sig Start Date End Date Taking? Authorizing Provider  ondansetron (ZOFRAN ODT) 8 MG disintegrating tablet Take 1 tablet (8 mg total) by mouth every 8 (eight) hours as needed for nausea or vomiting. 04/27/21  Yes Becky Augusta, NP  albuterol (VENTOLIN HFA) 108 (90 Base) MCG/ACT inhaler Inhale 1-2 puffs into the lungs every 6 (six) hours as needed for wheezing or shortness of breath. 01/14/21   Tommie Sams, DO  ARIPiprazole (ABILIFY) 5 MG tablet Take 5 mg by mouth daily.    [provider]  clonazePAM (KLONOPIN) 1 MG tablet TAKE 1/2 TO 1 TABLET BY MOUTH TWICE A DAY 01/28/18   [provider]  DULoxetine (CYMBALTA) 20 MG capsule Take 20 mg by mouth daily.    [provider]  escitalopram (LEXAPRO) 20 MG tablet Take 20 mg by mouth daily.    [provider]  gabapentin (NEURONTIN) 300 MG capsule TAKE 1-2 CAPSULES BY MOUTH TWICE DAILY 01/17/18   [provider]  levothyroxine (SYNTHROID, LEVOTHROID) 25 MCG tablet Take 25 mcg by mouth daily. 05/07/18   [provider]  metFORMIN (GLUCOPHAGE) 500 MG tablet  Take 500 mg by mouth 2 (two) times daily. 12/09/17   [provider]  oxybutynin (DITROPAN) 5 MG tablet TAKE 1 TABLET BY MOUTH IN THE MORNING AND 2 TABLETS AT BEDTIME 05/07/18   [provider]  predniSONE (DELTASONE) 50 MG tablet 1 tablet daily x 5 days 01/14/21   Tommie Samsook, Jayce G, DO  promethazine-dextromethorphan (PROMETHAZINE-DM) 6.25-15 MG/5ML syrup Take 5 mLs by mouth 4 (four) times daily as needed for cough. 01/14/21   Tommie Samsook, Jayce G, DO  FLUoxetine (PROZAC) 20 MG tablet TAKE 1 AND ONE-HALF TABLETS (30MG  TOTAL) BY MOUTH DAILY. 05/07/18 04/14/20  [provider]  lisinopril (PRINIVIL,ZESTRIL) 20 MG tablet Take 20 mg  by mouth daily.  01/09/18 01/14/21  [provider]    Family History Family History  Problem Relation Age of Onset   Cirrhosis Father    Heart disease Maternal Grandmother     Social History Social History   Tobacco Use   Smoking status: Former    Packs/day: 0.50    Years: 16.00    Pack years: 8.00    Types: Cigarettes    Quit date: 10/04/1995    Years since quitting: 25.5   Smokeless tobacco: Never  Vaping Use   Vaping Use: Never used  Substance Use Topics   Alcohol use: No   Drug use: No    Comment: denied all drug/alcohol/tobacco use     Allergies   Patient has no known allergies.   Review of Systems Review of Systems  Constitutional:  Negative for activity change, appetite change and fever.  HENT:  Positive for congestion. Negative for rhinorrhea.   Respiratory:  Negative for cough.   Gastrointestinal:  Positive for nausea and vomiting. Negative for abdominal pain, constipation and diarrhea.  Hematological: Negative.   Psychiatric/Behavioral: Negative.      Physical Exam Triage Vital Signs ED Triage Vitals  Enc Vitals Group     BP 04/27/21 1237 (!) 145/98     Pulse Rate 04/27/21 1237 95     Resp 04/27/21 1237 16     Temp 04/27/21 1237 97.8 F (36.6 C)     Temp Source 04/27/21 1237 Oral     SpO2 04/27/21 1237 97 %     Weight --      Height --      Head Circumference --      Peak Flow --      Pain Score 04/27/21 1235 2     Pain Loc --      Pain Edu? --      Excl. in GC? --    No data found.  Updated Vital Signs BP (!) 145/98   Pulse 95   Temp 97.8 F (36.6 C) (Oral)   Resp 16   SpO2 97%   Visual Acuity Right Eye Distance:   Left Eye Distance:   Bilateral Distance:    Right Eye Near:   Left Eye Near:    Bilateral Near:     Physical Exam Vitals and nursing note reviewed.  Constitutional:      General: She is not in acute distress.    Appearance: Normal appearance. She is obese. She is ill-appearing.  HENT:     Head:  Normocephalic and atraumatic.  Cardiovascular:     Rate and Rhythm: Normal rate and regular rhythm.     Pulses: Normal pulses.     Heart sounds: Normal heart sounds. No murmur heard.   No gallop.  Pulmonary:     Effort:  Pulmonary effort is normal.     Breath sounds: Normal breath sounds. No wheezing, rhonchi or rales.  Abdominal:     General: Bowel sounds are normal. There is no distension.     Palpations: Abdomen is soft.     Tenderness: There is no abdominal tenderness. There is no guarding or rebound.  Skin:    General: Skin is warm and dry.     Capillary Refill: Capillary refill takes less than 2 seconds.     Findings: No erythema or rash.  Neurological:     General: No focal deficit present.     Mental Status: She is alert and oriented to person, place, and time.  Psychiatric:        Mood and Affect: Mood normal.        Behavior: Behavior normal.        Thought Content: Thought content normal.        Judgment: Judgment normal.     UC Treatments / Results  Labs (all labs ordered are listed, but only abnormal results are displayed) Labs Reviewed  URINALYSIS, COMPLETE (UACMP) WITH MICROSCOPIC - Abnormal; Notable for the following components:      Result Value   Glucose, UA 500 (*)    Bacteria, UA FEW (*)    All other components within normal limits  GLUCOSE, CAPILLARY - Abnormal; Notable for the following components:   Glucose-Capillary 228 (*)    All other components within normal limits  CBG MONITORING, ED    EKG   Radiology No results found.  Procedures Procedures (including critical care time)  Medications Ordered in UC Medications - No data to display  Initial Impression / Assessment and Plan / UC Course  I have reviewed the triage vital signs and the nursing notes.  Pertinent labs & imaging results that were available during my care of the patient were reviewed by me and considered in my medical decision making (see chart for details).  Pleasant  though ill-appearing 55 year old female here for evaluation of vomiting and continuous nausea.  She reports that she had 4 visits of vomiting since 3 PM yesterday afternoon.  She reports that she ate breakfast this morning and then threw it up 30 minutes later.  She is noncommittal when I asked if she is been able to keep down fluids.  Physical exam reveals sticky oral mucous membranes but normal skin turgor.  Cardiopulmonary exam reveals clear lung sounds in all fields.  Abdomen is protuberant but soft and nontender with positive bowel sounds in all 4 quadrants.  Urinalysis was collected at triage which shows 500 glucose and few bacteria but is otherwise unremarkable.  Will check CBG.  If patient CBG is high we will check CMP and provide IV fluids but if her CBG is normal we will prescribe patient Zofran and discharge her home to orally rehydrate.  Patient's fingerstick blood sugar was 228.  Suspect patient's vomiting is either stemming from a viral illness or from something she ate.  Will discharge patient home with a prescription for Zofran and have her orally rehydrate.  Return and ER precautions reviewed with patient.   Final Clinical Impressions(s) / UC Diagnoses   Final diagnoses:  Non-intractable vomiting with nausea, unspecified vomiting type     Discharge Instructions      Use the Zofran ODT 8 mg every 8 hours as needed for nausea.  Just placed on under your tongue and will dissolve.  Follow a clear liquid diet for the next 12  to 24 hours.  But no drinking is more important than eating.  Clear liquids are things such as broth, water, ginger ale, Jell-O, or Pedialyte.  If you continue vomiting at home and are unable to keep down fluids despite using Zofran I can recommend she go to the emergency department for evaluation and IV rehydration.     ED Prescriptions     Medication Sig Dispense Auth. Provider   ondansetron (ZOFRAN ODT) 8 MG disintegrating tablet Take 1 tablet (8 mg  total) by mouth every 8 (eight) hours as needed for nausea or vomiting. 20 tablet Becky Augusta, NP      PDMP not reviewed this encounter.   Becky Augusta, NP 04/27/21 1423

## 2021-04-27 NOTE — ED Notes (Signed)
Reports urinary frequency, requests UA

## 2021-04-27 NOTE — Discharge Instructions (Addendum)
Use the Zofran ODT 8 mg every 8 hours as needed for nausea.  Just placed on under your tongue and will dissolve.  Follow a clear liquid diet for the next 12 to 24 hours.  But no drinking is more important than eating.  Clear liquids are things such as broth, water, ginger ale, Jell-O, or Pedialyte.  If you continue vomiting at home and are unable to keep down fluids despite using Zofran I can recommend she go to the emergency department for evaluation and IV rehydration.

## 2021-04-27 NOTE — ED Triage Notes (Signed)
Emesis x4 in last 24 hours, started yesterday at 1500.   Constant nausea, no abdominal pain or diarrhea.

## 2021-05-19 DIAGNOSIS — N939 Abnormal uterine and vaginal bleeding, unspecified: Secondary | ICD-10-CM | POA: Insufficient documentation

## 2021-07-01 DIAGNOSIS — E785 Hyperlipidemia, unspecified: Secondary | ICD-10-CM | POA: Insufficient documentation

## 2021-07-01 DIAGNOSIS — E039 Hypothyroidism, unspecified: Secondary | ICD-10-CM | POA: Insufficient documentation

## 2021-07-11 ENCOUNTER — Ambulatory Visit
Admission: EM | Admit: 2021-07-11 | Discharge: 2021-07-11 | Disposition: A | Discharge status: Home / Self Care | Payer: 59

## 2021-07-11 ENCOUNTER — Other Ambulatory Visit: Payer: Self-pay

## 2021-07-11 ENCOUNTER — Encounter: Payer: Self-pay | Admitting: Emergency Medicine

## 2021-07-11 DIAGNOSIS — S39012A Strain of muscle, fascia and tendon of lower back, initial encounter: Secondary | ICD-10-CM

## 2021-07-11 MED ORDER — BACLOFEN 10 MG PO TABS: 10.0000 mg | ORAL_TABLET | Freq: Three times a day (TID) | ORAL | 0 refills | Status: DC

## 2021-07-11 MED ORDER — IBUPROFEN 600 MG PO TABS: 600.0000 mg | ORAL_TABLET | Freq: Four times a day (QID) | ORAL | 0 refills | Status: DC | PRN

## 2021-07-11 NOTE — Discharge Instructions (Signed)
Take the ibuprofen, 600 mg every 6 hours with food, on a schedule for the next 48 hours and then as needed.  Take the baclofen, 10 mg every 8 hours, on a schedule for the next 48 hours and then as needed.  Apply moist heat to your back for 30 minutes at a time 2-3 times a day to improve blood flow to the area and help remove the lactic acid causing the spasm.  Follow the back exercises given at discharge.  Return for reevaluation for any new or worsening symptoms.  

## 2021-07-11 NOTE — ED Provider Notes (Signed)
[facility]    CSN: [id number] Arrival date & time: 07/11/21  1003      History   Chief Complaint Chief Complaint  Patient presents with   Back Pain    HPI Maria Hancock is a 55 y.o. female.   HPI  55 year old female here for evaluation of low back pain.  Patient reports that she tripped over the foot of her bed and landed on the ground on her left side 2 days ago and she is been experiencing pain in her low back ever since.  She states that her pain increases with movement and is better with rest.  She has been using heat, ice, and taking over-the-counter Aleve with mild relief of symptoms.  She denies any numbness, tingling, weakness in the upper extremities or any incontinence or numbness in her thighs or perineum.  Past Medical History:  Diagnosis Date   Anxiety    Arthritis    "knees, back, hands" (02/26/2013)   Depression    Diabetes mellitus without complication (HCC)    GERD (gastroesophageal reflux disease)    with Mobic   Hyperlipidemia    Hypertension    IBS (irritable bowel syndrome)    Menorrhagia    Migraine headache    Psoriasis     Patient Active Problem List   Diagnosis Date Noted   Atypical chest pain 05/31/2018   SOB (shortness of breath) 05/31/2018   Lightheadedness 05/31/2018   Palpitations 05/31/2018   Morbid obesity (HCC) 05/31/2018   Plantar fasciitis of left foot 01/26/2018   Major depression, recurrent, chronic (HCC) 12/02/2015   Osteoarthritis of left knee 02/26/2013    Class: Diagnosis of    Past Surgical History:  Procedure Laterality Date   HARDWARE REMOVAL Left 02/26/2013   Procedure: HARDWARE REMOVAL;  Surgeon: Cammy Copa, MD;  Location: [facility] OR;  Service: Orthopedics;  Laterality: Left;  Left Knee Removal of Hardware   JOINT REPLACEMENT     KNEE ARTHROPLASTY Left ?2001   "put hardware in" (02/26/2013)   KNEE ARTHROSCOPY Left    "I've had 2; 2nd one was 2013" (02/26/2013)   REPLACEMENT TOTAL KNEE Left  02/26/2013   "w/hardware removal" (02/26/2013)   TOTAL KNEE ARTHROPLASTY Left 02/26/2013   Procedure: TOTAL KNEE ARTHROPLASTY;  Surgeon: Cammy Copa, MD;  Location: [facility] OR;  Service: Orthopedics;  Laterality: Left;  Left Total Knee Arthroplasty    TUBAL LIGATION  1988    OB History   No obstetric history on file.      Home Medications    Prior to Admission medications   Medication Sig Start Date End Date Taking? Authorizing Provider  ARIPiprazole (ABILIFY) 5 MG tablet Take 5 mg by mouth daily.   Yes [provider]  baclofen (LIORESAL) 10 MG tablet Take 1 tablet (10 mg total) by mouth 3 (three) times daily. 07/11/21  Yes Becky Augusta, NP  DULoxetine (CYMBALTA) 20 MG capsule Take 20 mg by mouth daily.   Yes [provider]  escitalopram (LEXAPRO) 20 MG tablet Take 20 mg by mouth daily.   Yes [provider]  ibuprofen (ADVIL) 600 MG tablet Take 1 tablet (600 mg total) by mouth every 6 (six) hours as needed. 07/11/21  Yes Becky Augusta, NP  levothyroxine (SYNTHROID, LEVOTHROID) 25 MCG tablet Take 25 mcg by mouth daily. 05/07/18  Yes [provider]  lisinopril (ZESTRIL) 40 MG tablet Take 40 mg by mouth daily. 06/28/21  Yes [provider]  metFORMIN (GLUCOPHAGE) 500  MG tablet Take 500 mg by mouth 2 (two) times daily. 12/09/17  Yes [provider]  TRADJENTA 5 MG TABS tablet Take 5 mg by mouth daily. 06/29/21  Yes [provider]  albuterol (VENTOLIN HFA) 108 (90 Base) MCG/ACT inhaler Inhale 1-2 puffs into the lungs every 6 (six) hours as needed for wheezing or shortness of breath. 01/14/21   Tommie Sams, DO  atorvastatin (LIPITOR) 20 MG tablet Take 20 mg by mouth daily. 07/06/21   [provider]  ondansetron (ZOFRAN ODT) 8 MG disintegrating tablet Take 1 tablet (8 mg total) by mouth every 8 (eight) hours as needed for nausea or vomiting. 04/27/21   Becky Augusta, NP  FLUoxetine (PROZAC) 20 MG tablet TAKE 1 AND ONE-HALF  TABLETS (30MG  TOTAL) BY MOUTH DAILY. 05/07/18 04/14/20  [provider]    Family History Family History  Problem Relation Age of Onset   Cirrhosis Father    Heart disease Maternal Grandmother     Social History Social History   Tobacco Use   Smoking status: Former    Packs/day: 0.50    Years: 16.00    Pack years: 8.00    Types: Cigarettes    Quit date: 10/04/1995    Years since quitting: 25.7   Smokeless tobacco: Never  Vaping Use   Vaping Use: Never used  Substance Use Topics   Alcohol use: No   Drug use: No    Comment: denied all drug/alcohol/tobacco use     Allergies   Patient has no known allergies.   Review of Systems Review of Systems  Genitourinary:  Negative for difficulty urinating.  Musculoskeletal:  Positive for back pain.  Skin:  Negative for color change and wound.  Neurological:  Negative for weakness and numbness.  Hematological: Negative.   Psychiatric/Behavioral: Negative.      Physical Exam Triage Vital Signs ED Triage Vitals  Enc Vitals Group     BP 07/11/21 1046 128/78     Pulse Rate 07/11/21 1046 95     Resp 07/11/21 1046 14     Temp 07/11/21 1046 98.2 F (36.8 C)     Temp Source 07/11/21 1046 Oral     SpO2 07/11/21 1046 96 %     Weight 07/11/21 1041 (!) 321 lb (145.6 kg)     Height 07/11/21 1041 5\' 5"  (1.651 m)     Head Circumference --      Peak Flow --      Pain Score 07/11/21 1041 6     Pain Loc --      Pain Edu? --      Excl. in GC? --    No data found.  Updated Vital Signs BP 128/78 (BP Location: Left Arm)   Pulse 95   Temp 98.2 F (36.8 C) (Oral)   Resp 14   Ht 5\' 5"  (1.651 m)   Wt (!) 321 lb (145.6 kg)   SpO2 96%   BMI 53.42 kg/m   Visual Acuity Right Eye Distance:   Left Eye Distance:   Bilateral Distance:    Right Eye Near:   Left Eye Near:    Bilateral Near:     Physical Exam Vitals and nursing note reviewed.  Constitutional:      General: She is not in acute distress.    Appearance:  Normal appearance. She is not ill-appearing.  HENT:     Head: Normocephalic and atraumatic.  Cardiovascular:     Rate and Rhythm: Normal rate and  regular rhythm.     Pulses: Normal pulses.     Heart sounds: Normal heart sounds. No murmur heard.   No gallop.  Pulmonary:     Effort: Pulmonary effort is normal.     Breath sounds: Normal breath sounds. No wheezing, rhonchi or rales.  Musculoskeletal:        General: Tenderness present. No swelling or deformity. Normal range of motion.  Skin:    General: Skin is warm and dry.     Capillary Refill: Capillary refill takes less than 2 seconds.     Findings: No bruising, erythema or lesion.  Neurological:     General: No focal deficit present.     Mental Status: She is alert and oriented to person, place, and time.  Psychiatric:        Mood and Affect: Mood normal.        Behavior: Behavior normal.        Thought Content: Thought content normal.        Judgment: Judgment normal.     UC Treatments / Results  Labs (all labs ordered are listed, but only abnormal results are displayed) Labs Reviewed - No data to display  EKG   Radiology No results found.  Procedures Procedures (including critical care time)  Medications Ordered in UC Medications - No data to display  Initial Impression / Assessment and Plan / UC Course  I have reviewed the triage vital signs and the nursing notes.  Pertinent labs & imaging results that were available during my care of the patient were reviewed by me and considered in my medical decision making (see chart for details).  Patient is a nontoxic-appearing 55 year old female here for evaluation of low back pain as outlined HPI above.  Patient's physical exam reveals patient's axial carriage and normal alignment.  Her cardiopulmonary exam reveals clear lung sounds in all fields.  Patient has no midline lower thoracic, lumbar, or sacral spinous tenderness.  She does have bilateral lower lumbar  paraspinous tenderness as well as right upper lumbar spasm.  There is no bruising or abrasions noted on the skin.  Patient is able to ambulate and has no difficulty with transitioning from sitting to standing.  Patient exam is consistent with a lumbar strain and will treat with ibuprofen 600 mg, baclofen 10 mg, moist heat, and home PT.   Final Clinical Impressions(s) / UC Diagnoses   Final diagnoses:  Strain of lumbar region, initial encounter     Discharge Instructions      Take the ibuprofen, 600 mg every 6 hours with food, on a schedule for the next 48 hours and then as needed.  Take the baclofen, 10 mg every 8 hours, on a schedule for the next 48 hours and then as needed.  Apply moist heat to your back for 30 minutes at a time 2-3 times a day to improve blood flow to the area and help remove the lactic acid causing the spasm.  Follow the back exercises given at discharge.  Return for reevaluation for any new or worsening symptoms.      ED Prescriptions     Medication Sig Dispense Auth. Provider   ibuprofen (ADVIL) 600 MG tablet Take 1 tablet (600 mg total) by mouth every 6 (six) hours as needed. 30 tablet Becky Augusta, NP   baclofen (LIORESAL) 10 MG tablet Take 1 tablet (10 mg total) by mouth 3 (three) times daily. 30 each Becky Augusta, NP      PDMP not  reviewed this encounter.   Becky Augusta, NP 07/11/21 1128

## 2021-07-11 NOTE — ED Triage Notes (Signed)
Patient c/o lower back pain that started on Friday.  Patient states that she tripped over the foot of her bed on Friday and landed on her left side.  Patient denies hitting her head.

## 2021-07-22 ENCOUNTER — Ambulatory Visit
Admission: EM | Admit: 2021-07-22 | Discharge: 2021-07-22 | Disposition: A | Discharge status: Home / Self Care | Payer: 59 | Attending: Physician Assistant | Admitting: Physician Assistant

## 2021-07-22 ENCOUNTER — Other Ambulatory Visit: Payer: Self-pay

## 2021-07-22 ENCOUNTER — Encounter: Payer: Self-pay | Admitting: Emergency Medicine

## 2021-07-22 DIAGNOSIS — J069 Acute upper respiratory infection, unspecified: Secondary | ICD-10-CM

## 2021-07-22 NOTE — ED Triage Notes (Signed)
Patient c/o possible flu, body aches, sore throat, bilateral ear pain, head congestion since waking up yesterday.  Patient's temp yesterday was 100.1.  Patient has taken Thera-Flu.  Patient is vaccinated for COVID and FLU/

## 2021-07-22 NOTE — ED Provider Notes (Signed)
[name]    CSN: [id number] Arrival date & time: 07/22/21  0805      History   Chief Complaint Chief Complaint  Patient presents with   Possible Flu    HPI Maria Hancock is a 55 y.o. female.   Patient here today for evaluation of possible flulike symptoms.  She reports she has had congestion, fever, headaches and body aches.  She denies any nausea, vomiting or diarrhea.  She has had some sore throat as well as ear discomfort.  She tried taking TheraFlu which did help her go to sleep.  The history is provided by the patient.   Past Medical History:  Diagnosis Date   Anxiety    Arthritis    "knees, back, hands" (02/26/2013)   Depression    Diabetes mellitus without complication (HCC)    GERD (gastroesophageal reflux disease)    with Mobic   Hyperlipidemia    Hypertension    IBS (irritable bowel syndrome)    Menorrhagia    Migraine headache    Psoriasis     Patient Active Problem List   Diagnosis Date Noted   Atypical chest pain 05/31/2018   SOB (shortness of breath) 05/31/2018   Lightheadedness 05/31/2018   Palpitations 05/31/2018   Morbid obesity (HCC) 05/31/2018   Plantar fasciitis of left foot 01/26/2018   Major depression, recurrent, chronic (HCC) 12/02/2015   Osteoarthritis of left knee 02/26/2013    Class: Diagnosis of    Past Surgical History:  Procedure Laterality Date   HARDWARE REMOVAL Left 02/26/2013   Procedure: HARDWARE REMOVAL;  Surgeon: Cammy Copa, MD;  Location: [facility] OR;  Service: Orthopedics;  Laterality: Left;  Left Knee Removal of Hardware   JOINT REPLACEMENT     KNEE ARTHROPLASTY Left ?2001   "put hardware in" (02/26/2013)   KNEE ARTHROSCOPY Left    "I've had 2; 2nd one was 2013" (02/26/2013)   REPLACEMENT TOTAL KNEE Left 02/26/2013   "w/hardware removal" (02/26/2013)   TOTAL KNEE ARTHROPLASTY Left 02/26/2013   Procedure: TOTAL KNEE ARTHROPLASTY;  Surgeon: Cammy Copa, MD;  Location: [facility] OR;  Service:  Orthopedics;  Laterality: Left;  Left Total Knee Arthroplasty    TUBAL LIGATION  1988    OB History   No obstetric history on file.      Home Medications    Prior to Admission medications   Medication Sig Start Date End Date Taking? Authorizing Provider  albuterol (VENTOLIN HFA) 108 (90 Base) MCG/ACT inhaler Inhale 1-2 puffs into the lungs every 6 (six) hours as needed for wheezing or shortness of breath. 01/14/21  Yes Cook, Jayce G, DO  ARIPiprazole (ABILIFY) 5 MG tablet Take 5 mg by mouth daily.   Yes [provider]  atorvastatin (LIPITOR) 20 MG tablet Take 20 mg by mouth daily. 07/06/21  Yes [provider]  baclofen (LIORESAL) 10 MG tablet Take 1 tablet (10 mg total) by mouth 3 (three) times daily. 07/11/21  Yes Becky Augusta, NP  DULoxetine (CYMBALTA) 20 MG capsule Take 20 mg by mouth daily.   Yes [provider]  escitalopram (LEXAPRO) 20 MG tablet Take 20 mg by mouth daily.   Yes [provider]  ibuprofen (ADVIL) 600 MG tablet Take 1 tablet (600 mg total) by mouth every 6 (six) hours as needed. 07/11/21  Yes Becky Augusta, NP  levothyroxine (SYNTHROID, LEVOTHROID) 25 MCG tablet Take 25 mcg by mouth daily. 05/07/18  Yes [provider]  lisinopril (ZESTRIL) 40 MG tablet  Take 40 mg by mouth daily. 06/28/21  Yes [provider]  metFORMIN (GLUCOPHAGE) 500 MG tablet Take 500 mg by mouth 2 (two) times daily. 12/09/17  Yes [provider]  ondansetron (ZOFRAN ODT) 8 MG disintegrating tablet Take 1 tablet (8 mg total) by mouth every 8 (eight) hours as needed for nausea or vomiting. 04/27/21  Yes Becky Augusta, NP  TRADJENTA 5 MG TABS tablet Take 5 mg by mouth daily. 06/29/21  Yes [provider]  FLUoxetine (PROZAC) 20 MG tablet TAKE 1 AND ONE-HALF TABLETS (30MG  TOTAL) BY MOUTH DAILY. 05/07/18 04/14/20  [provider]    Family History Family History  Problem Relation Age of Onset   Cirrhosis Father    Heart disease  Maternal Grandmother     Social History Social History   Tobacco Use   Smoking status: Former    Packs/day: 0.50    Years: 16.00    Pack years: 8.00    Types: Cigarettes    Quit date: 10/04/1995    Years since quitting: 25.8   Smokeless tobacco: Never  Vaping Use   Vaping Use: Never used  Substance Use Topics   Alcohol use: No   Drug use: No    Comment: denied all drug/alcohol/tobacco use     Allergies   Patient has no known allergies.   Review of Systems Review of Systems  Constitutional:  Negative for chills and fever.  HENT:  Positive for congestion, ear pain, sinus pressure and sore throat.   Eyes:  Negative for discharge and redness.  Respiratory:  Positive for cough. Negative for shortness of breath and wheezing.   Gastrointestinal:  Negative for abdominal pain, diarrhea, nausea and vomiting.    Physical Exam Triage Vital Signs ED Triage Vitals  Enc Vitals Group     BP 07/22/21 0814 (!) 149/93     Pulse Rate 07/22/21 0814 90     Resp --      Temp 07/22/21 0814 98.1 F (36.7 C)     Temp Source 07/22/21 0814 Oral     SpO2 07/22/21 0814 94 %     Weight 07/22/21 0815 (!) 321 lb (145.6 kg)     Height 07/22/21 0815 5\' 5"  (1.651 m)     Head Circumference --      Peak Flow --      Pain Score 07/22/21 0815 4     Pain Loc --      Pain Edu? --      Excl. in GC? --    No data found.  Updated Vital Signs BP (!) 149/93 (BP Location: Left Arm)   Pulse 90   Temp 98.1 F (36.7 C) (Oral)   Ht 5\' 5"  (1.651 m)   Wt (!) 321 lb (145.6 kg)   SpO2 94%   BMI 53.42 kg/m   Physical Exam Vitals and nursing note reviewed.  Constitutional:      General: She is not in acute distress.    Appearance: Normal appearance. She is not ill-appearing.  HENT:     Head: Normocephalic and atraumatic.     Right Ear: Tympanic membrane normal.     Left Ear: Tympanic membrane normal.     Nose: Congestion present.     Mouth/Throat:     Mouth: Mucous membranes are moist.      Pharynx: No oropharyngeal exudate or posterior oropharyngeal erythema.  Eyes:     Conjunctiva/sclera: Conjunctivae normal.  Cardiovascular:     Rate and Rhythm: Normal  rate and regular rhythm.     Heart sounds: Normal heart sounds. No murmur heard. Pulmonary:     Effort: Pulmonary effort is normal. No respiratory distress.     Breath sounds: Normal breath sounds. No wheezing, rhonchi or rales.  Skin:    General: Skin is warm and dry.  Neurological:     Mental Status: She is alert.  Psychiatric:        Mood and Affect: Mood normal.        Thought Content: Thought content normal.     UC Treatments / Results  Labs (all labs ordered are listed, but only abnormal results are displayed) Labs Reviewed  COVID-19, FLU A+B NAA    EKG   Radiology No results found.  Procedures Procedures (including critical care time)  Medications Ordered in UC Medications - No data to display  Initial Impression / Assessment and Plan / UC Course  I have reviewed the triage vital signs and the nursing notes.  Pertinent labs & imaging results that were available during my care of the patient were reviewed by me and considered in my medical decision making (see chart for details).    Suspect viral etiology of symptoms and will order flu, COVID screening.  Will await for results for further recommendation in the meantime encouraged symptomatic treatment and follow-up with any further concerns.  Final Clinical Impressions(s) / UC Diagnoses   Final diagnoses:  Viral upper respiratory tract infection     Discharge Instructions      Follow up with any further concerns.      ED Prescriptions   None    PDMP not reviewed this encounter.   Tomi Bamberger, PA-C 07/22/21 [phone]

## 2021-07-22 NOTE — Discharge Instructions (Addendum)
Follow up with any further concerns.  

## 2021-07-23 LAB — COVID-19, FLU A+B NAA
Influenza A, NAA: NOT DETECTED
Influenza B, NAA: NOT DETECTED
SARS-CoV-2, NAA: NOT DETECTED

## 2021-07-24 ENCOUNTER — Telehealth: Payer: 59 | Admitting: Emergency Medicine

## 2021-07-24 DIAGNOSIS — R051 Acute cough: Secondary | ICD-10-CM | POA: Diagnosis not present

## 2021-07-24 DIAGNOSIS — R0981 Nasal congestion: Secondary | ICD-10-CM

## 2021-07-24 DIAGNOSIS — J019 Acute sinusitis, unspecified: Secondary | ICD-10-CM | POA: Diagnosis not present

## 2021-07-24 MED ORDER — AMOXICILLIN-POT CLAVULANATE 875-125 MG PO TABS: 1.0000 | ORAL_TABLET | Freq: Two times a day (BID) | ORAL | 0 refills | Status: DC

## 2021-07-24 NOTE — Patient Instructions (Signed)
Dena Billet, thank you for joining [place], PA-C for today's virtual visit.  While this provider is not your primary care provider (PCP), if your PCP is located in our provider database this encounter information will be shared with them immediately following your visit.  Consent: (Patient) Maria Hancock provided verbal consent for this virtual visit at the beginning of the encounter.  Current Medications:  Current Outpatient Medications:    amoxicillin-clavulanate (AUGMENTIN) 875-125 MG tablet, Take 1 tablet by mouth 2 (two) times daily., Disp: 20 tablet, Rfl: 0   albuterol (VENTOLIN HFA) 108 (90 Base) MCG/ACT inhaler, Inhale 1-2 puffs into the lungs every 6 (six) hours as needed for wheezing or shortness of breath., Disp: 18 g, Rfl: 1   ARIPiprazole (ABILIFY) 5 MG tablet, Take 5 mg by mouth daily., Disp: , Rfl:    atorvastatin (LIPITOR) 20 MG tablet, Take 20 mg by mouth daily., Disp: , Rfl:    baclofen (LIORESAL) 10 MG tablet, Take 1 tablet (10 mg total) by mouth 3 (three) times daily., Disp: 30 each, Rfl: 0   DULoxetine (CYMBALTA) 20 MG capsule, Take 20 mg by mouth daily., Disp: , Rfl:    escitalopram (LEXAPRO) 20 MG tablet, Take 20 mg by mouth daily., Disp: , Rfl:    ibuprofen (ADVIL) 600 MG tablet, Take 1 tablet (600 mg total) by mouth every 6 (six) hours as needed., Disp: 30 tablet, Rfl: 0   levothyroxine (SYNTHROID, LEVOTHROID) 25 MCG tablet, Take 25 mcg by mouth daily., Disp: , Rfl: 3   lisinopril (ZESTRIL) 40 MG tablet, Take 40 mg by mouth daily., Disp: , Rfl:    metFORMIN (GLUCOPHAGE) 500 MG tablet, Take 500 mg by mouth 2 (two) times daily., Disp: , Rfl: 5   ondansetron (ZOFRAN ODT) 8 MG disintegrating tablet, Take 1 tablet (8 mg total) by mouth every 8 (eight) hours as needed for nausea or vomiting., Disp: 20 tablet, Rfl: 0   TRADJENTA 5 MG TABS tablet, Take 5 mg by mouth daily., Disp: , Rfl:    Medications ordered in this encounter:  Meds ordered this encounter   Medications   amoxicillin-clavulanate (AUGMENTIN) 875-125 MG tablet    Sig: Take 1 tablet by mouth 2 (two) times daily.    Dispense:  20 tablet    Refill:  0    Order Specific Question:   Supervising Provider    Answer:   Hyacinth Meeker, BRIAN [[id number]]     *If you need refills on other medications prior to your next appointment, please contact your pharmacy*  Follow-Up: Call back or seek an in-person evaluation if the symptoms worsen or if the condition fails to improve as anticipated.  Other Instructions Rest and push fluids Augmentin prescribed.  Take as directed and to completion Continue with OTC ibuprofen/tylenol as needed for pain Follow up with PCP or in person at urgent care if symptoms persists Follow up in person at urgent care or go to the ED if you have any new or worsening symptoms such as fever, chills, worsening sinus pain/pressure, cough, sore throat, chest pain, shortness of breath, abdominal pain, changes in bowel or bladder habits, etc...    If you have been instructed to have an in-person evaluation today at a local Urgent Care facility, please use the link below. It will take you to a list of all of our available [facility], including address, phone number and hours of operation. Please do not delay care.  [facility]  If  you or a family member do not have a primary care provider, use the link below to schedule a visit and establish care. When you choose a [facility] primary care physician or advanced practice provider, you gain a long-term partner in health. Find a Primary Care Provider  Learn more about [facility] in-office and virtual care options: [facility]

## 2021-07-24 NOTE — Progress Notes (Signed)
Virtual Visit Consent   Maria Hancock, you are scheduled for a virtual visit with a [facility] provider today.     Just as with appointments in the office, your consent must be obtained to participate.  Your consent will be active for this visit and any virtual visit you may have with one of our providers in the next 365 days.     If you have a MyChart account, a copy of this consent can be sent to you electronically.  All virtual visits are billed to your insurance company just like a traditional visit in the office.    As this is a virtual visit, video technology does not allow for your provider to perform a traditional examination.  This may limit your provider's ability to fully assess your condition.  If your provider identifies any concerns that need to be evaluated in person or the need to arrange testing (such as labs, EKG, etc.), we will make arrangements to do so.     Although advances in technology are sophisticated, we cannot ensure that it will always work on either your end or our end.  If the connection with a video visit is poor, the visit may have to be switched to a telephone visit.  With either a video or telephone visit, we are not always able to ensure that we have a secure connection.     I need to obtain your verbal consent now.   Are you willing to proceed with your visit today?    Maria Hancock has provided verbal consent on 07/24/2021 for a virtual visit (video or telephone).   Rennis Harding, [place]   Date: 07/24/2021 12:35 PM   Virtual Visit via Video Note   I, Rennis Harding, connected with  Maria Hancock  ([id number], 09/05/1966) on 07/24/21 at 12:30 PM EDT by a video-enabled telemedicine application and verified that I am speaking with the correct person using two identifiers.  Location: Patient: Virtual Visit Location Patient: Home Provider: Virtual Visit Location Provider: Home Office   I discussed the limitations of evaluation and management  by telemedicine and the availability of in person appointments. The patient expressed understanding and agreed to proceed.    History of Present Illness: Maria Hancock is a 55 y.o. who identifies as a female who was assigned female at birth, and is being seen today for congestion, runny nose, sore throat, sinus pain/ pressure, productive cough, and drainage x 4-5 days.  Denies sick exposure to COVID, flu or strep.  Has tried OTC medications without relief.  Denies aggravating factors.  Reports previous symptoms in the past with sinus infection.   Denies fever, SOB, wheezing, chest pain, nausea, changes in bowel or bladder habits.    ROS: As per HPI.  All other pertinent ROS negative.       HPI: HPI  Problems:  Patient Active Problem List   Diagnosis Date Noted   Atypical chest pain 05/31/2018   SOB (shortness of breath) 05/31/2018   Lightheadedness 05/31/2018   Palpitations 05/31/2018   Morbid obesity (HCC) 05/31/2018   Plantar fasciitis of left foot 01/26/2018   Major depression, recurrent, chronic (HCC) 12/02/2015   Osteoarthritis of left knee 02/26/2013    Class: Diagnosis of    Allergies: No Known Allergies Medications:  Current Outpatient Medications:    amoxicillin-clavulanate (AUGMENTIN) 875-125 MG tablet, Take 1 tablet by mouth 2 (two) times daily., Disp: 20 tablet, Rfl: 0   albuterol (VENTOLIN HFA) 108 (90 Base)  MCG/ACT inhaler, Inhale 1-2 puffs into the lungs every 6 (six) hours as needed for wheezing or shortness of breath., Disp: 18 g, Rfl: 1   ARIPiprazole (ABILIFY) 5 MG tablet, Take 5 mg by mouth daily., Disp: , Rfl:    atorvastatin (LIPITOR) 20 MG tablet, Take 20 mg by mouth daily., Disp: , Rfl:    baclofen (LIORESAL) 10 MG tablet, Take 1 tablet (10 mg total) by mouth 3 (three) times daily., Disp: 30 each, Rfl: 0   DULoxetine (CYMBALTA) 20 MG capsule, Take 20 mg by mouth daily., Disp: , Rfl:    escitalopram (LEXAPRO) 20 MG tablet, Take 20 mg by mouth daily., Disp: ,  Rfl:    ibuprofen (ADVIL) 600 MG tablet, Take 1 tablet (600 mg total) by mouth every 6 (six) hours as needed., Disp: 30 tablet, Rfl: 0   levothyroxine (SYNTHROID, LEVOTHROID) 25 MCG tablet, Take 25 mcg by mouth daily., Disp: , Rfl: 3   lisinopril (ZESTRIL) 40 MG tablet, Take 40 mg by mouth daily., Disp: , Rfl:    metFORMIN (GLUCOPHAGE) 500 MG tablet, Take 500 mg by mouth 2 (two) times daily., Disp: , Rfl: 5   ondansetron (ZOFRAN ODT) 8 MG disintegrating tablet, Take 1 tablet (8 mg total) by mouth every 8 (eight) hours as needed for nausea or vomiting., Disp: 20 tablet, Rfl: 0   TRADJENTA 5 MG TABS tablet, Take 5 mg by mouth daily., Disp: , Rfl:   Observations/Objective: Patient is well-developed, well-nourished in no acute distress.  Resting comfortably  at home. Mildly fatigued appearing, but nontoxic Head is normocephalic, atraumatic.  No labored breathing. Speaking in full sentences, and tolerating own secretions Speech is clear and coherent with logical content.  Patient is alert and oriented at baseline.    Assessment and Plan: 1. Acute non-recurrent sinusitis, unspecified location  2. Acute cough  3. Nasal congestion Rest and push fluids Augmentin prescribed.  Take as directed and to completion Continue with OTC ibuprofen/tylenol as needed for pain Follow up with PCP or in person at urgent care if symptoms persists Follow up in person at urgent care or go to the ED if you have any new or worsening symptoms such as fever, chills, worsening sinus pain/pressure, cough, sore throat, chest pain, shortness of breath, abdominal pain, changes in bowel or bladder habits, etc...   Follow Up Instructions: I discussed the assessment and treatment plan with the patient. The patient was provided an opportunity to ask questions and all were answered. The patient agreed with the plan and demonstrated an understanding of the instructions.  A copy of instructions were sent to the patient via  MyChart unless otherwise noted below.    The patient was advised to call back or seek an in-person evaluation if the symptoms worsen or if the condition fails to improve as anticipated.  Time:  I spent 10 minutes with the patient via telehealth technology discussing the above problems/concerns.    Rennis Harding, PA-C

## 2021-09-18 DIAGNOSIS — M1990 Unspecified osteoarthritis, unspecified site: Secondary | ICD-10-CM | POA: Insufficient documentation

## 2021-09-19 ENCOUNTER — Encounter: Payer: Self-pay | Admitting: *Deleted

## 2021-09-19 ENCOUNTER — Ambulatory Visit
Admission: EM | Admit: 2021-09-19 | Discharge: 2021-09-19 | Disposition: A | Discharge status: Home / Self Care | Payer: 59 | Attending: Internal Medicine | Admitting: Internal Medicine

## 2021-09-19 ENCOUNTER — Other Ambulatory Visit: Payer: Self-pay

## 2021-09-19 DIAGNOSIS — J029 Acute pharyngitis, unspecified: Secondary | ICD-10-CM | POA: Diagnosis not present

## 2021-09-19 DIAGNOSIS — J069 Acute upper respiratory infection, unspecified: Secondary | ICD-10-CM

## 2021-09-19 DIAGNOSIS — H65191 Other acute nonsuppurative otitis media, right ear: Secondary | ICD-10-CM | POA: Diagnosis not present

## 2021-09-19 LAB — POCT RAPID STREP A (OFFICE): Rapid Strep A Screen: NEGATIVE

## 2021-09-19 MED ORDER — BENZONATATE 100 MG PO CAPS: 100.0000 mg | ORAL_CAPSULE | Freq: Three times a day (TID) | ORAL | 0 refills | Status: DC | PRN

## 2021-09-19 MED ORDER — ALBUTEROL SULFATE HFA 108 (90 BASE) MCG/ACT IN AERS: 1.0000 | INHALATION_SPRAY | Freq: Four times a day (QID) | RESPIRATORY_TRACT | 0 refills | Status: DC | PRN

## 2021-09-19 MED ORDER — CEFDINIR 300 MG PO CAPS: 300.0000 mg | ORAL_CAPSULE | Freq: Two times a day (BID) | ORAL | 0 refills | Status: AC

## 2021-09-19 NOTE — Discharge Instructions (Signed)
It appears that you have a viral upper respiratory infection that should resolve in the next few days with symptomatic treatment.  You have been prescribed an antibiotic for right ear infection as well as an albuterol inhaler to take as needed for shortness of breath.  A cough medication has been prescribed for you as well.  Please monitor oxygen at home with pulse oximeter.  Go to the hospital if symptoms do not improve or worsen.

## 2021-09-19 NOTE — ED Provider Notes (Signed)
[name]    CSN: [id number] Arrival date & time: 09/19/21  0818      History   Chief Complaint Chief Complaint  Patient presents with   Cough   Fever   Sore Throat   Generalized Body Aches    HPI Maria Hancock is a 55 y.o. female.   Patient presents with fever, nonproductive cough, body aches, sore throat, nasal congestion that started approximately 4 days ago.  Patient also reports some intermittent shortness of breath.  Denies any history of any chronic lung diseases.  Denies any known fevers or sick contacts.  Patient has taken over-the-counter cough and cold medications with minimal improvement in symptoms.  Denies ear pain, nausea, vomiting, diarrhea, abdominal pain.   Cough Fever Sore Throat   Past Medical History:  Diagnosis Date   Anxiety    Arthritis    "knees, back, hands" (02/26/2013)   Depression    Diabetes mellitus without complication (HCC)    GERD (gastroesophageal reflux disease)    with Mobic   Hyperlipidemia    Hypertension    IBS (irritable bowel syndrome)    Menorrhagia    Migraine headache    Psoriasis     Patient Active Problem List   Diagnosis Date Noted   Atypical chest pain 05/31/2018   SOB (shortness of breath) 05/31/2018   Lightheadedness 05/31/2018   Palpitations 05/31/2018   Morbid obesity (HCC) 05/31/2018   Plantar fasciitis of left foot 01/26/2018   Major depression, recurrent, chronic (HCC) 12/02/2015   Osteoarthritis of left knee 02/26/2013    Class: Diagnosis of    Past Surgical History:  Procedure Laterality Date   HARDWARE REMOVAL Left 02/26/2013   Procedure: HARDWARE REMOVAL;  Surgeon: Cammy Copa, MD;  Location: [facility] OR;  Service: Orthopedics;  Laterality: Left;  Left Knee Removal of Hardware   JOINT REPLACEMENT     KNEE ARTHROPLASTY Left ?2001   "put hardware in" (02/26/2013)   KNEE ARTHROSCOPY Left    "I've had 2; 2nd one was 2013" (02/26/2013)   REPLACEMENT TOTAL KNEE Left 02/26/2013    "w/hardware removal" (02/26/2013)   TOTAL KNEE ARTHROPLASTY Left 02/26/2013   Procedure: TOTAL KNEE ARTHROPLASTY;  Surgeon: Cammy Copa, MD;  Location: [facility] OR;  Service: Orthopedics;  Laterality: Left;  Left Total Knee Arthroplasty    TUBAL LIGATION  1988    OB History   No obstetric history on file.      Home Medications    Prior to Admission medications   Medication Sig Start Date End Date Taking? Authorizing Provider  albuterol (VENTOLIN HFA) 108 (90 Base) MCG/ACT inhaler Inhale 1-2 puffs into the lungs every 6 (six) hours as needed for wheezing or shortness of breath. 09/19/21  Yes , Rolly Salter E, FNP  benzonatate (TESSALON) 100 MG capsule Take 1 capsule (100 mg total) by mouth every 8 (eight) hours as needed for cough. 09/19/21  Yes , Rolly Salter E, FNP  cefdinir (OMNICEF) 300 MG capsule Take 1 capsule (300 mg total) by mouth 2 (two) times daily for 10 days. 09/19/21 09/29/21 Yes , Rolly Salter E, FNP  ARIPiprazole (ABILIFY) 5 MG tablet Take 5 mg by mouth daily.    [provider]  atorvastatin (LIPITOR) 20 MG tablet Take 20 mg by mouth daily. 07/06/21   [provider]  baclofen (LIORESAL) 10 MG tablet Take 1 tablet (10 mg total) by mouth 3 (three) times daily. 07/11/21   Becky Augusta, NP  DULoxetine (CYMBALTA) 20 MG capsule Take  20 mg by mouth daily.    [provider]  escitalopram (LEXAPRO) 20 MG tablet Take 20 mg by mouth daily.    [provider]  ibuprofen (ADVIL) 600 MG tablet Take 1 tablet (600 mg total) by mouth every 6 (six) hours as needed. 07/11/21   Becky Augusta, NP  levothyroxine (SYNTHROID, LEVOTHROID) 25 MCG tablet Take 25 mcg by mouth daily. 05/07/18   [provider]  lisinopril (ZESTRIL) 40 MG tablet Take 40 mg by mouth daily. 06/28/21   [provider]  metFORMIN (GLUCOPHAGE) 500 MG tablet Take 500 mg by mouth 2 (two) times daily. 12/09/17   [provider]  ondansetron (ZOFRAN ODT) 8 MG disintegrating  tablet Take 1 tablet (8 mg total) by mouth every 8 (eight) hours as needed for nausea or vomiting. 04/27/21   Becky Augusta, NP  TRADJENTA 5 MG TABS tablet Take 5 mg by mouth daily. 06/29/21   [provider]  FLUoxetine (PROZAC) 20 MG tablet TAKE 1 AND ONE-HALF TABLETS (30MG  TOTAL) BY MOUTH DAILY. 05/07/18 04/14/20  [provider]    Family History Family History  Problem Relation Age of Onset   Cirrhosis Father    Heart disease Maternal Grandmother     Social History Social History   Tobacco Use   Smoking status: Former    Packs/day: 0.50    Years: 16.00    Pack years: 8.00    Types: Cigarettes    Quit date: 10/04/1995    Years since quitting: 25.9   Smokeless tobacco: Never  Vaping Use   Vaping Use: Never used  Substance Use Topics   Alcohol use: No   Drug use: No    Comment: denied all drug/alcohol/tobacco use     Allergies   Patient has no known allergies.   Review of Systems Review of Systems Per HPI  Physical Exam Triage Vital Signs ED Triage Vitals  Enc Vitals Group     BP 09/19/21 0859 103/77     Pulse Rate 09/19/21 0859 99     Resp 09/19/21 0859 20     Temp 09/19/21 0859 98.8 F (37.1 C)     Temp src --      SpO2 09/19/21 0859 92 %     Weight --      Height --      Head Circumference --      Peak Flow --      Pain Score 09/19/21 0901 6     Pain Loc --      Pain Edu? --      Excl. in GC? --    No data found.  Updated Vital Signs BP 103/77    Pulse 99    Temp 98.8 F (37.1 C)    Resp 20    SpO2 97%   Visual Acuity Right Eye Distance:   Left Eye Distance:   Bilateral Distance:    Right Eye Near:   Left Eye Near:    Bilateral Near:     Physical Exam Constitutional:      General: She is not in acute distress.    Appearance: Normal appearance. She is not toxic-appearing or diaphoretic.  HENT:     Head: Normocephalic and atraumatic.     Right Ear: Ear canal normal. No drainage, swelling or tenderness. No mastoid  tenderness. Tympanic membrane is erythematous. Tympanic membrane is not perforated or bulging.     Left Ear: Tympanic membrane and ear canal normal.  Nose: Congestion present.     Mouth/Throat:     Mouth: Mucous membranes are moist.     Pharynx: Posterior oropharyngeal erythema present.  Eyes:     Extraocular Movements: Extraocular movements intact.     Conjunctiva/sclera: Conjunctivae normal.     Pupils: Pupils are equal, round, and reactive to light.  Cardiovascular:     Rate and Rhythm: Normal rate and regular rhythm.     Pulses: Normal pulses.     Heart sounds: Normal heart sounds.  Pulmonary:     Effort: Pulmonary effort is normal. No respiratory distress.     Breath sounds: Normal breath sounds. No stridor. No wheezing, rhonchi or rales.  Abdominal:     General: Abdomen is flat. Bowel sounds are normal.     Palpations: Abdomen is soft.  Musculoskeletal:        General: Normal range of motion.     Cervical back: Normal range of motion.  Skin:    General: Skin is warm and dry.  Neurological:     General: No focal deficit present.     Mental Status: She is alert and oriented to person, place, and time. Mental status is at baseline.  Psychiatric:        Mood and Affect: Mood normal.        Behavior: Behavior normal.     UC Treatments / Results  Labs (all labs ordered are listed, but only abnormal results are displayed) Labs Reviewed  CULTURE, GROUP A STREP ([facility])  COVID-19, FLU A+B NAA  POCT RAPID STREP A (OFFICE)    EKG   Radiology No results found.  Procedures Procedures (including critical care time)  Medications Ordered in UC Medications - No data to display  Initial Impression / Assessment and Plan / UC Course  I have reviewed the triage vital signs and the nursing notes.  Pertinent labs & imaging results that were available during my care of the patient were reviewed by me and considered in my medical decision making (see chart for details).      Patient presents with symptoms likely from a viral upper respiratory infection. Differential includes bacterial pneumonia, sinusitis, allergic rhinitis, COVID-19, flu. Do not suspect underlying cardiopulmonary process. Symptoms seem unlikely related to ACS, CHF or COPD exacerbations, pneumonia, pneumothorax. Patient is nontoxic appearing and not in need of emergent medical intervention.  Rapid strep is negative.  Throat culture, COVID-19, flu swab pending.  Suggested chest x-ray to patient.  Do not have x-ray tech in urgent care today but offered patient outpatient imaging.  Patient declined.  Risks associated with not doing x-ray were discussed with patient.  Recommended symptom control with over the counter medications.  Cefdinir to treat right ear infection. Albuterol inhaler to help with intermittent shortness of breath.  Will hold off on prednisone at this time as patient has history of diabetes.  Benzonatate to take as needed for cough.  Return if symptoms fail to improve in 1-2 weeks or you develop shortness of breath, chest pain, severe headache. Patient states understanding and is agreeable.  Discharged with PCP followup.  Final Clinical Impressions(s) / UC Diagnoses   Final diagnoses:  Viral upper respiratory tract infection with cough  Sore throat  Other non-recurrent acute nonsuppurative otitis media of right ear     Discharge Instructions      It appears that you have a viral upper respiratory infection that should resolve in the next few days with symptomatic treatment.  You have been prescribed  an antibiotic for right ear infection as well as an albuterol inhaler to take as needed for shortness of breath.  A cough medication has been prescribed for you as well.  Please monitor oxygen at home with pulse oximeter.  Go to the hospital if symptoms do not improve or worsen.    ED Prescriptions     Medication Sig Dispense Auth. Provider   cefdinir (OMNICEF) 300 MG capsule  Take 1 capsule (300 mg total) by mouth 2 (two) times daily for 10 days. 20 capsule [place], Rolly Salter E, [place]   albuterol (VENTOLIN HFA) 108 (90 Base) MCG/ACT inhaler Inhale 1-2 puffs into the lungs every 6 (six) hours as needed for wheezing or shortness of breath. 1 each Foley, [place] E, [place]   benzonatate (TESSALON) 100 MG capsule Take 1 capsule (100 mg total) by mouth every 8 (eight) hours as needed for cough. 21 capsule [place], Acie Fredrickson, [place]      PDMP not reviewed this encounter.   Gustavus Bryant, [place] 09/19/21 [phone]

## 2021-09-19 NOTE — ED Triage Notes (Signed)
Pt reports on Thursday she had a fever,cough,body aches .

## 2021-09-20 LAB — COVID-19, FLU A+B NAA
Influenza A, NAA: DETECTED — AB
Influenza B, NAA: NOT DETECTED
SARS-CoV-2, NAA: NOT DETECTED

## 2021-09-22 LAB — CULTURE, GROUP A STREP (THRC)

## 2021-09-30 ENCOUNTER — Ambulatory Visit (HOSPITAL_COMMUNITY)
Admission: RE | Admit: 2021-09-30 | Discharge: 2021-09-30 | Disposition: A | Discharge status: Home / Self Care | Payer: 59 | Source: Ambulatory Visit | Attending: Surgery | Admitting: Surgery

## 2021-09-30 ENCOUNTER — Ambulatory Visit (INDEPENDENT_AMBULATORY_CARE_PROVIDER_SITE_OTHER): Payer: 59 | Admitting: Surgery

## 2021-09-30 ENCOUNTER — Ambulatory Visit: Payer: Self-pay

## 2021-09-30 ENCOUNTER — Other Ambulatory Visit: Payer: Self-pay

## 2021-09-30 DIAGNOSIS — Z96652 Presence of left artificial knee joint: Secondary | ICD-10-CM

## 2021-09-30 DIAGNOSIS — M7989 Other specified soft tissue disorders: Secondary | ICD-10-CM | POA: Diagnosis present

## 2021-09-30 DIAGNOSIS — M79662 Pain in left lower leg: Secondary | ICD-10-CM | POA: Insufficient documentation

## 2021-09-30 NOTE — Progress Notes (Signed)
Office Visit Note   Patient: Maria Hancock           Date of Birth: 02-03-1966           MRN: [id number] Visit Date: 09/30/2021              Requested by: Center, Phineas Real [facility] [address] [place],  [place] [address] PCP: Center, Phineas Real [facility]   Assessment & Plan: Visit Diagnoses:  1. H/O total knee replacement, left   2. Pain of left calf   3. Left leg swelling     Plan: With patient's left calf pain and swelling recommend getting a stat venous Doppler to rule out DVT.  She is leaving straight from our office to have this done this afternoon.  I will await callback report.  For her bilateral lower extremity edema I also did advise her to speak with her primary care provider regarding this.  She should follow-up with Dr. August Saucer for yearly rechecks of her left total knee replacement.  Follow-Up Instructions: No follow-ups on file.   Orders:  Orders Placed This Encounter  Procedures   XR KNEE 3 VIEW LEFT   VAS [place] LOWER EXTREMITY VENOUS (DVT)   No orders of the defined types were placed in this encounter.     Procedures: No procedures performed   Clinical Data: No additional findings.   Subjective: Chief Complaint  Patient presents with   Other    Complains of lumps on lower extremity that swells and has become painful    HPI 55 year old white female comes in today with complaints of left greater than right lower extremity edema and left calf pain.  Patient is status post left total knee replacement by Dr. August Saucer Feb 26, 2013.  He has not seen patient in at least a few years.  Comes in today with complaints of left great and right lower extremity swelling.  This has been going on for a while.  Denies chest pain shortness of breath.  She does have a primary care provider but states that she has not discussed the leg swelling issue with them. Review of Systems No current cardiac pulmonary GI GU issues  Objective: Vital  Signs: There were no vitals taken for this visit.  Physical Exam Constitutional:      Appearance: She is obese.  HENT:     Head: Normocephalic and atraumatic.  Eyes:     Extraocular Movements: Extraocular movements intact.  Pulmonary:     Effort: No respiratory distress.  Neurological:     Mental Status: She is alert and oriented to person, place, and time.  Psychiatric:        Mood and Affect: Mood normal.    Ortho Exam  Specialty Comments:  No specialty comments available.  Imaging: XR KNEE 3 VIEW LEFT  Result Date: 09/30/2021 X-ray left knee shows good alignment and seating of the prosthesis.  No complicating features.    PMFS History: Patient Active Problem List   Diagnosis Date Noted   Atypical chest pain 05/31/2018   SOB (shortness of breath) 05/31/2018   Lightheadedness 05/31/2018   Palpitations 05/31/2018   Morbid obesity (HCC) 05/31/2018   Plantar fasciitis of left foot 01/26/2018   Major depression, recurrent, chronic (HCC) 12/02/2015   Osteoarthritis of left knee 02/26/2013    Class: Diagnosis of   Past Medical History:  Diagnosis Date   Anxiety    Arthritis    "knees, back, hands" (02/26/2013)  Depression    Diabetes mellitus without complication (HCC)    GERD (gastroesophageal reflux disease)    with Mobic   Hyperlipidemia    Hypertension    IBS (irritable bowel syndrome)    Menorrhagia    Migraine headache    Psoriasis     Family History  Problem Relation Age of Onset   Cirrhosis Father    Heart disease Maternal Grandmother     Past Surgical History:  Procedure Laterality Date   HARDWARE REMOVAL Left 02/26/2013   Procedure: HARDWARE REMOVAL;  Surgeon: Cammy Copa, MD;  Location: [facility] OR;  Service: Orthopedics;  Laterality: Left;  Left Knee Removal of Hardware   JOINT REPLACEMENT     KNEE ARTHROPLASTY Left ?2001   "put hardware in" (02/26/2013)   KNEE ARTHROSCOPY Left    "I've had 2; 2nd one was 2013" (02/26/2013)   REPLACEMENT  TOTAL KNEE Left 02/26/2013   "w/hardware removal" (02/26/2013)   TOTAL KNEE ARTHROPLASTY Left 02/26/2013   Procedure: TOTAL KNEE ARTHROPLASTY;  Surgeon: Cammy Copa, MD;  Location: [facility] OR;  Service: Orthopedics;  Laterality: Left;  Left Total Knee Arthroplasty    TUBAL LIGATION  1988   Social History   Occupational History   Not on file  Tobacco Use   Smoking status: Former    Packs/day: 0.50    Years: 16.00    Pack years: 8.00    Types: Cigarettes    Quit date: 10/04/1995    Years since quitting: 26.0   Smokeless tobacco: Never  Vaping Use   Vaping Use: Never used  Substance and Sexual Activity   Alcohol use: No   Drug use: No    Comment: denied all drug/alcohol/tobacco use   Sexual activity: Yes    Birth control/protection: I.U.D.    Comment: IUD in placed

## 2021-10-27 DIAGNOSIS — Z0000 Encounter for general adult medical examination without abnormal findings: Secondary | ICD-10-CM | POA: Insufficient documentation

## 2021-10-27 DIAGNOSIS — M545 Low back pain, unspecified: Secondary | ICD-10-CM | POA: Insufficient documentation

## 2021-10-27 DIAGNOSIS — Z96652 Presence of left artificial knee joint: Secondary | ICD-10-CM | POA: Insufficient documentation

## 2021-11-09 ENCOUNTER — Ambulatory Visit
Admission: EM | Admit: 2021-11-09 | Discharge: 2021-11-09 | Disposition: A | Discharge status: Home / Self Care | Payer: 59 | Attending: Internal Medicine | Admitting: Internal Medicine

## 2021-11-09 ENCOUNTER — Other Ambulatory Visit: Payer: Self-pay

## 2021-11-09 DIAGNOSIS — M79641 Pain in right hand: Secondary | ICD-10-CM | POA: Diagnosis not present

## 2021-11-09 DIAGNOSIS — M65331 Trigger finger, right middle finger: Secondary | ICD-10-CM

## 2021-11-09 DIAGNOSIS — M65321 Trigger finger, right index finger: Secondary | ICD-10-CM

## 2021-11-09 MED ORDER — NAPROXEN 500 MG PO TABS: 500.0000 mg | ORAL_TABLET | Freq: Two times a day (BID) | ORAL | 0 refills | Status: AC

## 2021-11-09 NOTE — Discharge Instructions (Signed)
Symptoms and exam today are consistent with carpal tunnel symptoms in the right hand, although there are other possible causes of pain/numbness in the thumb/index/middle fingers.  You also describe trigger finger symptoms, in the index and middle fingers.  Try wearing the brace on your right hand at bedtime and during the day as much as you can, to limit painful activities.  Keep followup appointment with hand doctor ([place]) on 2/14.  Note for work for several days.  Prescription for naprosyn (anti inflammatory, pain reliever) sent to the pharmacy.

## 2021-11-09 NOTE — ED Provider Notes (Signed)
[facility]    CSN: [id number] Arrival date & time: 11/09/21  1347      History   Chief Complaint Chief Complaint  Patient presents with   Finger problem    HPI Maria Hancock is a 56 y.o. female.  She presents today with pain and swelling in the right hand, has been going on for at least several weeks.  Appears to be most noticeable in the right 1st, 2nd, 3rd MCPs and fingers, and she has subjective swelling there as well.  Hard to make a fist and use her fingers, grip things.  Sometimes pain wakes her up in the middle of the night, and sometimes she has some numbness or tingling.  She describes the pain as sharp.  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 She has been taking 3 otc aleve every couple days which provides some relief.  Has an appointment to see a hand surgeon, Dr Frazier Butt, in [place] next Tuesday 2/14. Feels well otherwise--no fever, no malaise. Having a very hard time doing her job, due to pain in the hand, and would like a work note for a few days.  HPI  Past Medical History:  Diagnosis Date   Anxiety    Arthritis    "knees, back, hands" (02/26/2013)   Depression    Diabetes mellitus without complication (HCC)    GERD (gastroesophageal reflux disease)    with Mobic   Hyperlipidemia    Hypertension    IBS (irritable bowel syndrome)    Menorrhagia    Migraine headache    Psoriasis     Patient Active Problem List   Diagnosis Date Noted   Atypical chest pain 05/31/2018   SOB (shortness of breath) 05/31/2018   Lightheadedness 05/31/2018   Palpitations 05/31/2018   Morbid obesity (HCC) 05/31/2018   Plantar fasciitis of left foot 01/26/2018   Major depression, recurrent, chronic (HCC) 12/02/2015   Osteoarthritis of left knee 02/26/2013    Class: Diagnosis of    Past Surgical History:   Procedure Laterality Date   HARDWARE REMOVAL Left 02/26/2013   Procedure: HARDWARE REMOVAL;  Surgeon: Cammy Copa, MD;  Location: [facility];  Service: Orthopedics;  Laterality: Left;  Left Knee Removal of Hardware   JOINT REPLACEMENT     KNEE ARTHROPLASTY Left ?2001   "put hardware in" (02/26/2013)   KNEE ARTHROSCOPY Left    "I've had 2; 2nd one was 2013" (02/26/2013)   REPLACEMENT TOTAL KNEE Left 02/26/2013   "w/hardware removal" (02/26/2013)   TOTAL KNEE ARTHROPLASTY Left 02/26/2013   Procedure: TOTAL KNEE ARTHROPLASTY;  Surgeon: Cammy Copa, MD;  Location: [facility];  Service: Orthopedics;  Laterality: Left;  Left Total Knee Arthroplasty    TUBAL LIGATION  1988    Home Medications    Prior to Admission medications   Medication Sig Start Date End Date Taking? Authorizing Provider  naproxen (NAPROSYN) 500 MG tablet Take 1 tablet (500 mg total) by mouth 2 (two) times daily for 7 days. 11/09/21 11/16/21 Yes Isa Rankin, MD  ARIPiprazole (ABILIFY) 5 MG tablet Take 5 mg by mouth daily.    [provider]  atorvastatin (LIPITOR) 20 MG tablet Take 20 mg by mouth daily. 07/06/21   [provider]  DULoxetine (CYMBALTA) 20 MG capsule Take 20 mg by mouth daily.    [provider]  escitalopram (LEXAPRO) 20 MG tablet Take 20 mg by mouth daily.    [provider]  ibuprofen (ADVIL) 600 MG tablet Take 1 tablet (600 mg total) by mouth every 6 (six) hours as needed. 07/11/21   Becky Augusta, NP  levothyroxine (SYNTHROID, LEVOTHROID) 25 MCG tablet Take 25 mcg by mouth daily. 05/07/18   [provider]  lisinopril (ZESTRIL) 40 MG tablet Take 40 mg by mouth daily. 06/28/21   [provider]  metFORMIN (GLUCOPHAGE) 500 MG tablet Take 500 mg by mouth 2 (two) times daily. 12/09/17   [provider]  TRADJENTA 5 MG TABS tablet Take 5 mg by mouth daily. 06/29/21   [provider]  FLUoxetine (PROZAC) 20 MG tablet TAKE 1 AND ONE-HALF  TABLETS (30MG  TOTAL) BY MOUTH DAILY. 05/07/18 04/14/20  [provider]    Family History Family History  Problem Relation Age of Onset   Cirrhosis Father    Heart disease Maternal Grandmother     Social History Social History   Tobacco Use   Smoking status: Former    Packs/day: 0.50    Years: 16.00    Pack years: 8.00    Types: Cigarettes    Quit date: 10/04/1995    Years since quitting: 26.1   Smokeless tobacco: Never  Vaping Use   Vaping Use: Never used  Substance Use Topics   Alcohol use: No   Drug use: Never    Comment: denied all drug/alcohol/tobacco use     Allergies   Patient has no known allergies.   Review of Systems Review of Systems see HPI   Physical Exam Triage Vital Signs ED Triage Vitals  Enc Vitals Group     BP 11/09/21 1507 (!) 143/102     Pulse Rate 11/09/21 1507 91     Resp 11/09/21 1507 20     Temp 11/09/21 1507 97.8 F (36.6 C)     Temp Source 11/09/21 1507 Oral     SpO2 11/09/21 1507 98 %     Weight --      Height --      Pain Score 11/09/21 1509 7     Pain Loc --    Updated Vital Signs BP (!) 143/102 (BP Location: Right Wrist)    Pulse 91    Temp 97.8 F (36.6 C) (Oral)    Resp 20    SpO2 98%   Physical Exam Constitutional:      General: She is not in acute distress.    Appearance: She is not ill-appearing or toxic-appearing.     Comments: Good hygiene  HENT:     Head: Atraumatic.     Mouth/Throat:     Mouth: Mucous membranes are moist.  Eyes:     Conjunctiva/sclera:     Right eye: Right conjunctiva is not injected. No exudate.    Left eye: Left conjunctiva is not injected. No exudate.    Comments: Conjugate gaze appreciated  Cardiovascular:     Rate and Rhythm: Normal rate.  Pulmonary:     Effort: Pulmonary effort is normal. No respiratory distress.  Abdominal:     General: There is no distension.  Musculoskeletal:     Cervical back: Neck supple.     Comments: Right and left hands symmetric to inspection and  palpation at MCPs, fingers.  Decreased fist ability due to pain on right.  No rash/redness.  Subtle warmth of R 2nd MCP compared to left.   Able to walk into urgent care  independently  Skin:    General: Skin is warm and dry.     Comments: Pink, no cyanosis, no rash  Neurological:     Mental Status: She is alert.     Comments: Face symmetric, speech clear, coherent, logical     UC Treatments / Results  Labs (all labs ordered are listed, but only abnormal results are displayed) Labs Reviewed - No data to display NA  EKG NA  Radiology No results found. NA  Procedures Procedures (including critical care time) Right wrist/thumb splint applied per nursing  Medications Ordered in UC Medications - No data to display NA   Final Clinical Impressions(s) / UC Diagnoses   Final diagnoses:  Right hand pain  Trigger index finger of right hand  Trigger middle finger of right hand     Discharge Instructions      Symptoms and exam today are consistent with carpal tunnel symptoms in the right hand, although there are other possible causes of pain/numbness in the thumb/index/middle fingers.  You also describe trigger finger symptoms, in the index and middle fingers.  Try wearing the brace on your right hand at bedtime and during the day as much as you can, to limit painful activities.  Keep followup appointment with hand doctor ([place]) on 2/14.  Note for work for several days.  Prescription for naprosyn (anti inflammatory, pain reliever) sent to the pharmacy.   ED Prescriptions     Medication Sig Dispense Auth. Provider   naproxen (NAPROSYN) 500 MG tablet Take 1 tablet (500 mg total) by mouth 2 (two) times daily for 7 days. 15 tablet Isa Rankin, MD      PDMP not reviewed this encounter.   Isa Rankin, MD 11/10/21 1630

## 2021-11-09 NOTE — ED Triage Notes (Signed)
Pt reports right thumb, right index finger and right middle finger get lock up or down position x 3 weeks.  Right index finger is painful and swelling x 1 week. Aleve gives no relief.

## 2021-11-16 ENCOUNTER — Other Ambulatory Visit: Payer: Self-pay

## 2021-11-16 ENCOUNTER — Ambulatory Visit: Payer: Self-pay

## 2021-11-16 ENCOUNTER — Encounter: Payer: Self-pay | Admitting: Orthopedic Surgery

## 2021-11-16 ENCOUNTER — Ambulatory Visit (INDEPENDENT_AMBULATORY_CARE_PROVIDER_SITE_OTHER): Payer: 59 | Admitting: Orthopedic Surgery

## 2021-11-16 VITALS — BP 120/82 | HR 98 | Ht 65.0 in

## 2021-11-16 DIAGNOSIS — M79641 Pain in right hand: Secondary | ICD-10-CM

## 2021-11-16 NOTE — Progress Notes (Signed)
Office Visit Note   Patient: Maria Hancock           Date of Birth: 10-Dec-1965           MRN: [id number] Visit Date: 11/16/2021              Requested by: [facility], [place],  [place] [address] PCP: [facility], [place]: Visit Diagnoses:  1. Pain in right hand     Plan: Discussed with patient that the numbness and tingling in her thumb, index, and middle fingers with nightly nocturnal symptoms and and positive provocative signs are consistent with carpal tunnel syndrome.  Like like to get an EMG/nerve conduction study to further evaluate.  I will see her back in the office once the nerve study is done.   Follow-Up Instructions: No follow-ups on file.   Orders:  Orders Placed This Encounter  Procedures   XR Hand Complete Right   No orders of the defined types were placed in this encounter.     Procedures: No procedures performed   Clinical Data: No additional findings.   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  Is been going on for quite some time but has been getting worse over the past few weeks.  This wakes her up almost every single night and causes her to shake her hands for symptom relief.  She does have a history of diabetes and hypothyroidism.  She denies any wrist trauma or C-spine issues.  She had EMG/nerve conduction study many years ago and remembers being told that she had carpal tunnel syndrome.  She has not had any treatment so far.   Review of Systems   Objective: Vital Signs: BP 120/82 (BP Location: Left Arm, Patient Position: Sitting, Cuff Size: Large)    Pulse 98    Ht 5\' 5"  (1.651 m)    SpO2 96%    BMI 53.42 kg/m    Physical Exam Constitutional:      Appearance: Normal appearance.  Cardiovascular:     Rate and Rhythm: Normal rate.     Pulses: Normal pulses.  Pulmonary:     Effort: Pulmonary effort is normal.  Skin:    General: Skin is warm and dry.     Capillary Refill: Capillary refill takes less than 2 seconds.  Neurological:     Mental Status: She is alert.    Right Hand Exam   Other  Erythema: absent Sensation: normal Pulse: present  Comments:  + Tinel and Phalen signs at wrist.  Negative Tinel at elbow.  5/5 thenar motor strength without atrophy.      Specialty Comments:  No specialty comments available.  Imaging: No results found.   PMFS History: Patient Active Problem List   Diagnosis Date Noted   Atypical chest pain 05/31/2018   SOB (shortness of breath) 05/31/2018   Lightheadedness 05/31/2018   Palpitations 05/31/2018   Morbid obesity ([place]) 05/31/2018   Plantar fasciitis of left foot 01/26/2018   Major depression, recurrent, chronic ([place]) 12/02/2015   Osteoarthritis of left knee 02/26/2013    Class: Diagnosis of   Past Medical History:  Diagnosis Date   Anxiety    Arthritis    "  knees, back, hands" (02/26/2013)   Depression    Diabetes mellitus without complication (HCC)    GERD (gastroesophageal reflux disease)    with Mobic   Hyperlipidemia    Hypertension    IBS (irritable bowel syndrome)    Menorrhagia    Migraine headache    Psoriasis     Family History  Problem Relation Age of Onset   Cirrhosis Father    Heart disease Maternal Grandmother     Past Surgical History:  Procedure Laterality Date   HARDWARE REMOVAL Left 02/26/2013   Procedure: HARDWARE REMOVAL;  Surgeon: Meredith Pel, MD;  Location: [facility];  Service: Orthopedics;  Laterality: Left;  Left Knee Removal of Hardware   JOINT REPLACEMENT     KNEE ARTHROPLASTY Left ?2001   "put hardware in" (02/26/2013)   KNEE ARTHROSCOPY Left    "I've had 2; 2nd one was 2013" (02/26/2013)    REPLACEMENT TOTAL KNEE Left 02/26/2013   "w/hardware removal" (02/26/2013)   TOTAL KNEE ARTHROPLASTY Left 02/26/2013   Procedure: TOTAL KNEE ARTHROPLASTY;  Surgeon: Meredith Pel, MD;  Location: [facility];  Service: Orthopedics;  Laterality: Left;  Left Total Knee Arthroplasty    TUBAL LIGATION  1988   Social History   Occupational History   Not on file  Tobacco Use   Smoking status: Former    Packs/day: 0.50    Years: 16.00    Pack years: 8.00    Types: Cigarettes    Quit date: 10/04/1995    Years since quitting: 26.1   Smokeless tobacco: Never  Vaping Use   Vaping Use: Never used  Substance and Sexual Activity   Alcohol use: No   Drug use: Never    Comment: denied all drug/alcohol/tobacco use   Sexual activity: Yes    Birth control/protection: I.U.D.    Comment: IUD in placed

## 2021-11-30 ENCOUNTER — Telehealth: Payer: Self-pay | Admitting: Orthopedic Surgery

## 2021-11-30 NOTE — Telephone Encounter (Signed)
Pt called requesting pain medication. Pt has a nerve study app next week. Pt states she has been taking over the counter meds and it is not helping her pain. Please send pain meds to [facility] on [address]. Please call pt when called in at [phone].

## 2021-12-01 ENCOUNTER — Encounter: Payer: 59 | Admitting: Physical Medicine and Rehabilitation

## 2021-12-01 NOTE — Telephone Encounter (Signed)
Pt called again

## 2021-12-02 NOTE — Telephone Encounter (Signed)
Patient aware of the below message and will see him in office on the 10th to followup her nerve study ?

## 2021-12-08 ENCOUNTER — Ambulatory Visit (INDEPENDENT_AMBULATORY_CARE_PROVIDER_SITE_OTHER): Payer: 59 | Admitting: Physical Medicine and Rehabilitation

## 2021-12-08 ENCOUNTER — Other Ambulatory Visit: Payer: Self-pay

## 2021-12-08 DIAGNOSIS — R202 Paresthesia of skin: Secondary | ICD-10-CM | POA: Diagnosis not present

## 2021-12-08 NOTE — Progress Notes (Signed)
Pt state right hand numbness and pain that travels up her right arm. Pt state trouble hold and gripping items doing the day. Pt state she takes over the counter pain meds, use heat and pain cream to help ease her pain. Pt state she is right handed. ? ?Numeric Pain Rating Scale and Functional Assessment ?Average Pain 5 ? ? ?In the last MONTH (on 0-10 scale) has pain interfered with the following? ? ?1. General activity like being  able to carry out your everyday physical activities such as walking, climbing stairs, carrying groceries, or moving a chair?  ?Rating(9) ? ? ? ? ?

## 2021-12-10 ENCOUNTER — Ambulatory Visit: Payer: 59 | Admitting: Orthopedic Surgery

## 2021-12-13 ENCOUNTER — Ambulatory Visit (INDEPENDENT_AMBULATORY_CARE_PROVIDER_SITE_OTHER): Payer: 59 | Admitting: Orthopedic Surgery

## 2021-12-13 ENCOUNTER — Other Ambulatory Visit: Payer: Self-pay

## 2021-12-13 DIAGNOSIS — G5601 Carpal tunnel syndrome, right upper limb: Secondary | ICD-10-CM | POA: Diagnosis not present

## 2021-12-13 NOTE — Progress Notes (Signed)
? ?Office Visit Note ?  ?Patient: Maria Hancock           ?Date of Birth: 10-09-1965           ?MRN: [id number] ?Visit Date: 12/13/2021 ?             ?Requested by: [facility] ?[address] ?[place],  [place] [address] ?PCP: [facility] ? ? ?Assessment & Plan: ?Visit Diagnoses:  ?1. Carpal tunnel syndrome, right upper limb   ? ? ?Plan: We again discussed the diagnosis, prognosis, and both conservative and operative treatment options for carpal tunnel syndrome. ? ?After our discussion, the patient has elected to proceed with right carpal tunnel release.  We reviewed the benefits of surgery and the potential risks including, but not limited to, persistent symptoms, infection, damage to nearby nerves and blood vessels, delayed wound healing, need for additional surgeries.   ? ?All patient concerns and questions were addressed. ? ?A surgical date will be confirmed with the patient.   ? ?Follow-Up Instructions: No follow-ups on file.  ? ?Orders:  ?No orders of the defined types were placed in this encounter. ? ?No orders of the defined types were placed in this encounter. ? ? ? ? Procedures: ?No procedures performed ? ? ?Clinical Data: ?No additional findings. ? ? ?Subjective: ?Chief Complaint  ?Patient presents with  ? Right Arm - Follow-up  ?  EMG/NCS review  ? ? 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  She underwent EMG/nerve conduction study on 12/08/2021 which suggested moderate right carpal tunnel syndrome.  Her symptoms are unchanged since her last visit with me.  She still describes nightly nocturnal symptoms in which she wakes up and has to shake her hands for symptom relief.  She has tried extension braces with minimal symptom relief.  Her fingers are numb today. ? ? ?Review of Systems ? ? ?Objective: ?Vital Signs: There were no vitals taken for  this visit. ? ?Physical Exam ?Constitutional:   ?   Appearance: Normal appearance.  ?Cardiovascular:  ?   Rate and Rhythm: Normal rate.  ?   Pulses: Normal pulses.  ?Pulmonary:  ?   Effort: Pulmonary effort is normal.  ?Skin: ?   General: Skin is warm and dry.  ?   Capillary Refill: Capillary refill takes less than 2 seconds.  ?Neurological:  ?   Mental Status: She is alert.  ? ? ?Right Hand Exam  ? ?Tenderness  ?The patient is experiencing no tenderness.  ? ?Range of Motion  ?The patient has normal right wrist ROM.  ? ?Other  ?Erythema: absent ?Sensation: decreased ?Pulse: present ? ?Comments:  + Tinel and Phalen signs. Diminished sensation in thumb, index, and middle fingers.  ? ? ? ? ?Specialty Comments:  ?No specialty comments available. ? ?Imaging: ?No results found. ? ? ?PMFS History: ?Patient Active Problem List  ? Diagnosis Date Noted  ? Carpal tunnel syndrome, right upper limb 12/13/2021  ? Atypical chest pain 05/31/2018  ? SOB (shortness of breath) 05/31/2018  ? Lightheadedness 05/31/2018  ? Palpitations 05/31/2018  ? Morbid obesity (HCC) 05/31/2018  ? Plantar fasciitis of left foot 01/26/2018  ? Major depression, recurrent, chronic (HCC) 12/02/2015  ? Osteoarthritis of left knee 02/26/2013  ?  Class: Diagnosis of  ? ?Past Medical History:  ?Diagnosis Date  ? Anxiety   ? Arthritis   ? "  knees, back, hands" (02/26/2013)  ? Depression   ? Diabetes mellitus without complication (HCC)   ? GERD (gastroesophageal reflux disease)   ? with Mobic  ? Hyperlipidemia   ? Hypertension   ? IBS (irritable bowel syndrome)   ? Menorrhagia   ? Migraine headache   ? Psoriasis   ?  ?Family History  ?Problem Relation Age of Onset  ? Cirrhosis Father   ? Heart disease Maternal Grandmother   ?  ?Past Surgical History:  ?Procedure Laterality Date  ? HARDWARE REMOVAL Left 02/26/2013  ? Procedure: HARDWARE REMOVAL;  Surgeon: Cammy Copa, MD;  Location: [facility];  Service: Orthopedics;  Laterality: Left;  Left Knee Removal of  Hardware  ? JOINT REPLACEMENT    ? KNEE ARTHROPLASTY Left ?2001  ? "put hardware in" (02/26/2013)  ? KNEE ARTHROSCOPY Left   ? "I've had 2; 2nd one was 2013" (02/26/2013)  ? REPLACEMENT TOTAL KNEE Left 02/26/2013  ? "w/hardware removal" (02/26/2013)  ? TOTAL KNEE ARTHROPLASTY Left 02/26/2013  ? Procedure: TOTAL KNEE ARTHROPLASTY;  Surgeon: Cammy Copa, MD;  Location: [facility] OR;  Service: Orthopedics;  Laterality: Left;  Left Total Knee Arthroplasty ?  ? TUBAL LIGATION  1988  ? ?Social History  ? ?Occupational History  ? Not on file  ?Tobacco Use  ? Smoking status: Former  ?  Packs/day: 0.50  ?  Years: 16.00  ?  Pack years: 8.00  ?  Types: Cigarettes  ?  Quit date: 10/04/1995  ?  Years since quitting: 26.2  ? Smokeless tobacco: Never  ?Vaping Use  ? Vaping Use: Never used  ?Substance and Sexual Activity  ? Alcohol use: No  ? Drug use: Never  ?  Comment: denied all drug/alcohol/tobacco use  ? Sexual activity: Yes  ?  Birth control/protection: I.U.D.  ?  Comment: IUD in placed  ? ? ? ? ? ? ?

## 2021-12-13 NOTE — H&P (View-Only) (Signed)
? ?Office Visit Note ?  ?Patient: Maria Hancock           ?Date of Birth: 10-09-1965           ?MRN: [id number] ?Visit Date: 12/13/2021 ?             ?Requested by: [facility] ?[address] ?[place],  [place] [address] ?PCP: [facility] ? ? ?Assessment & Plan: ?Visit Diagnoses:  ?1. Carpal tunnel syndrome, right upper limb   ? ? ?Plan: We again discussed the diagnosis, prognosis, and both conservative and operative treatment options for carpal tunnel syndrome. ? ?After our discussion, the patient has elected to proceed with right carpal tunnel release.  We reviewed the benefits of surgery and the potential risks including, but not limited to, persistent symptoms, infection, damage to nearby nerves and blood vessels, delayed wound healing, need for additional surgeries.   ? ?All patient concerns and questions were addressed. ? ?A surgical date will be confirmed with the patient.   ? ?Follow-Up Instructions: No follow-ups on file.  ? ?Orders:  ?No orders of the defined types were placed in this encounter. ? ?No orders of the defined types were placed in this encounter. ? ? ? ? Procedures: ?No procedures performed ? ? ?Clinical Data: ?No additional findings. ? ? ?Subjective: ?Chief Complaint  ?Patient presents with  ? Right Arm - Follow-up  ?  EMG/NCS review  ? ? 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  She underwent EMG/nerve conduction study on 12/08/2021 which suggested moderate right carpal tunnel syndrome.  Her symptoms are unchanged since her last visit with me.  She still describes nightly nocturnal symptoms in which she wakes up and has to shake her hands for symptom relief.  She has tried extension braces with minimal symptom relief.  Her fingers are numb today. ? ? ?Review of Systems ? ? ?Objective: ?Vital Signs: There were no vitals taken for  this visit. ? ?Physical Exam ?Constitutional:   ?   Appearance: Normal appearance.  ?Cardiovascular:  ?   Rate and Rhythm: Normal rate.  ?   Pulses: Normal pulses.  ?Pulmonary:  ?   Effort: Pulmonary effort is normal.  ?Skin: ?   General: Skin is warm and dry.  ?   Capillary Refill: Capillary refill takes less than 2 seconds.  ?Neurological:  ?   Mental Status: She is alert.  ? ? ?Right Hand Exam  ? ?Tenderness  ?The patient is experiencing no tenderness.  ? ?Range of Motion  ?The patient has normal right wrist ROM.  ? ?Other  ?Erythema: absent ?Sensation: decreased ?Pulse: present ? ?Comments:  + Tinel and Phalen signs. Diminished sensation in thumb, index, and middle fingers.  ? ? ? ? ?Specialty Comments:  ?No specialty comments available. ? ?Imaging: ?No results found. ? ? ?PMFS History: ?Patient Active Problem List  ? Diagnosis Date Noted  ? Carpal tunnel syndrome, right upper limb 12/13/2021  ? Atypical chest pain 05/31/2018  ? SOB (shortness of breath) 05/31/2018  ? Lightheadedness 05/31/2018  ? Palpitations 05/31/2018  ? Morbid obesity (HCC) 05/31/2018  ? Plantar fasciitis of left foot 01/26/2018  ? Major depression, recurrent, chronic (HCC) 12/02/2015  ? Osteoarthritis of left knee 02/26/2013  ?  Class: Diagnosis of  ? ?Past Medical History:  ?Diagnosis Date  ? Anxiety   ? Arthritis   ? "  knees, back, hands" (02/26/2013)  ? Depression   ? Diabetes mellitus without complication (HCC)   ? GERD (gastroesophageal reflux disease)   ? with Mobic  ? Hyperlipidemia   ? Hypertension   ? IBS (irritable bowel syndrome)   ? Menorrhagia   ? Migraine headache   ? Psoriasis   ?  ?Family History  ?Problem Relation Age of Onset  ? Cirrhosis Father   ? Heart disease Maternal Grandmother   ?  ?Past Surgical History:  ?Procedure Laterality Date  ? HARDWARE REMOVAL Left 02/26/2013  ? Procedure: HARDWARE REMOVAL;  Surgeon: Gregory Scott Dean, MD;  Location: [facility];  Service: Orthopedics;  Laterality: Left;  Left Knee Removal of  Hardware  ? JOINT REPLACEMENT    ? KNEE ARTHROPLASTY Left ?2001  ? "put hardware in" (02/26/2013)  ? KNEE ARTHROSCOPY Left   ? "I've had 2; 2nd one was 2013" (02/26/2013)  ? REPLACEMENT TOTAL KNEE Left 02/26/2013  ? "w/hardware removal" (02/26/2013)  ? TOTAL KNEE ARTHROPLASTY Left 02/26/2013  ? Procedure: TOTAL KNEE ARTHROPLASTY;  Surgeon: Gregory Scott Dean, MD;  Location: [facility] OR;  Service: Orthopedics;  Laterality: Left;  Left Total Knee Arthroplasty ?  ? TUBAL LIGATION  1988  ? ?Social History  ? ?Occupational History  ? Not on file  ?Tobacco Use  ? Smoking status: Former  ?  Packs/day: 0.50  ?  Years: 16.00  ?  Pack years: 8.00  ?  Types: Cigarettes  ?  Quit date: 10/04/1995  ?  Years since quitting: 26.2  ? Smokeless tobacco: Never  ?Vaping Use  ? Vaping Use: Never used  ?Substance and Sexual Activity  ? Alcohol use: No  ? Drug use: Never  ?  Comment: denied all drug/alcohol/tobacco use  ? Sexual activity: Yes  ?  Birth control/protection: I.U.D.  ?  Comment: IUD in placed  ? ? ? ? ? ? ?

## 2021-12-13 NOTE — Progress Notes (Unsigned)
Maria Hancock - 56 y.o. female MRN [id number]  Date of birth: 03/31/1966  Office Visit Note: Visit Date: 12/08/2021 PCP: [facility] Referred by: Marlyne BeardsBenfield, Charlie, MD  Subjective: Chief Complaint  Patient presents with   Right Hand - Pain, Numbness   Right Arm - Pain, Numbness   HPI:  Maria Hancock is a 56 y.o. female who comes in today at the request of Dr. Marlyne Beardsharlie Benfield for electrodiagnostic study of the Right upper extremities.  Patient is Right hand dominant. works as a Research scientist (medical)dog groomer who presents with numbness and tingling of her right thumb, index, middle, and ring fingers.  Is been going on for quite some time but has been getting worse over the past few weeks.  This wakes her up almost every single night and causes her to shake her hands for symptom relief.  She does have a history of diabetes and hypothyroidism.   ROS Otherwise per HPI.  Assessment & Plan: Visit Diagnoses:    ICD-10-CM   1. Paresthesia of skin  R20.2 NCV with EMG (electromyography)      Plan: Impression: The above electrodiagnostic study is ABNORMAL and reveals evidence of a moderate right median nerve entrapment at the wrist (carpal tunnel syndrome) affecting sensory and motor components.   There is no significant electrodiagnostic evidence of any other focal nerve entrapment, brachial plexopathy or cervical radiculopathy.   Recommendations: 1.  Follow-up with referring physician. 2.  Continue current management of symptoms. 3.  Continue use of resting splint at night-time and as needed during the day. 4.  Suggest surgical evaluation.  Meds & Orders: No orders of the defined types were placed in this encounter.   Orders Placed This Encounter  Procedures   NCV with EMG (electromyography)    Follow-up: No follow-ups on file.   Procedures: No procedures performed  EMG & NCV Findings: Evaluation of the right median motor nerve showed prolonged distal onset  latency (4.9 ms) and decreased conduction velocity (Elbow-Wrist, 46 m/s).  The right median (across palm) sensory nerve showed prolonged distal peak latency (Wrist, 5.1 ms) and prolonged distal peak latency (Palm, 3.3 ms).  All remaining nerves (as indicated in the following tables) were within normal limits.    All examined muscles (as indicated in the following table) showed no evidence of electrical instability.    Impression: The above electrodiagnostic study is ABNORMAL and reveals evidence of a moderate right median nerve entrapment at the wrist (carpal tunnel syndrome) affecting sensory and motor components.   There is no significant electrodiagnostic evidence of any other focal nerve entrapment, brachial plexopathy or cervical radiculopathy.   Recommendations: 1.  Follow-up with referring physician. 2.  Continue current management of symptoms. 3.  Continue use of resting splint at night-time and as needed during the day. 4.  Suggest surgical evaluation.  ___________________________ Naaman PlummerFred Maridel Pixler FAAPMR Board Certified, American Board of Physical Medicine and Rehabilitation    Nerve Conduction Studies Anti Sensory Summary Table   Stim Site NR Peak (ms) Norm Peak (ms) P-T Amp (V) Norm P-T Amp Site1 Site2 Delta-P (ms) Dist (cm) Vel (m/s) Norm Vel (m/s)  Right Median Acr Palm Anti Sensory (2nd Digit)  30.6C  Wrist    *5.1 <3.6 12.6 >10 Wrist Palm 1.8 0.0    Palm    *3.3 <2.0 3.6         Right Radial Anti Sensory (Base 1st Digit)  31C  Wrist    2.0 <3.1 20.4  Wrist Base 1st Digit 2.0 0.0    Right Ulnar Anti Sensory (5th Digit)  31.1C  Wrist    3.7 <3.7 17.1 >15.0 Wrist 5th Digit 3.7 14.0 38 >38   Motor Summary Table   Stim Site NR Onset (ms) Norm Onset (ms) O-P Amp (mV) Norm O-P Amp Site1 Site2 Delta-0 (ms) Dist (cm) Vel (m/s) Norm Vel (m/s)  Right Median Motor (Abd Poll Brev)  31.2C    Martin-Gruber  Wrist    *4.9 <4.2 5.0 >5 Elbow Wrist 4.6 21.0 *46 >50  Elbow    9.5   5.3         Right Ulnar Motor (Abd Dig Min)  31.1C  Wrist    3.7 <4.2 5.6 >3 B Elbow Wrist 3.4 19.0 56 >53  B Elbow    7.1  6.7  A Elbow B Elbow 1.5 10.0 67 >53  A Elbow    8.6  5.8          EMG   Side Muscle Nerve Root Ins Act Fibs Psw Amp Dur Poly Recrt Int Dennie Bible Comment  Right Abd Poll Brev Median C8-T1 Nml Nml Nml Nml Nml 0 Nml Nml   Right 1stDorInt Ulnar C8-T1 Nml Nml Nml Nml Nml 0 Nml Nml   Right PronatorTeres Median C6-7 Nml Nml Nml Nml Nml 0 Nml Nml   Right Biceps Musculocut C5-6 Nml Nml Nml Nml Nml 0 Nml Nml   Right Deltoid Axillary C5-6 Nml Nml Nml Nml Nml 0 Nml Nml     Nerve Conduction Studies Anti Sensory Left/Right Comparison   Stim Site L Lat (ms) R Lat (ms) L-R Lat (ms) L Amp (V) R Amp (V) L-R Amp (%) Site1 Site2 L Vel (m/s) R Vel (m/s) L-R Vel (m/s)  Median Acr Palm Anti Sensory (2nd Digit)  30.6C  Wrist  *5.1   12.6  Wrist Palm     Palm  *3.3   3.6        Radial Anti Sensory (Base 1st Digit)  31C  Wrist  2.0   20.4  Wrist Base 1st Digit     Ulnar Anti Sensory (5th Digit)  31.1C  Wrist  3.7   17.1  Wrist 5th Digit  38    Motor Left/Right Comparison   Stim Site L Lat (ms) R Lat (ms) L-R Lat (ms) L Amp (mV) R Amp (mV) L-R Amp (%) Site1 Site2 L Vel (m/s) R Vel (m/s) L-R Vel (m/s)  Median Motor (Abd Poll Brev)  31.2C    Martin-Gruber  Wrist  *4.9   5.0  Elbow Wrist  *46   Elbow  9.5   5.3        Ulnar Motor (Abd Dig Min)  31.1C  Wrist  3.7   5.6  B Elbow Wrist  56   B Elbow  7.1   6.7  A Elbow B Elbow  67   A Elbow  8.6   5.8           Waveforms:             Clinical History: No specialty comments available.     Objective:  VS:  HT:     WT:    BMI:      BP:    HR: bpm   TEMP: ( )   RESP:  Physical Exam Musculoskeletal:        General: No swelling, tenderness or deformity.     Comments: Inspection reveals no atrophy of  the bilateral APB or FDI or hand intrinsics. There is no swelling, color changes, allodynia or dystrophic changes. There is 5  out of 5 strength in the bilateral wrist extension, finger abduction and long finger flexion. There is intact sensation to light touch in all dermatomal and peripheral nerve distributions but some dysesthesia in the median nerve distribution on the right. There is a positive Phalen's test on the right. There is a negative Hoffmann's test bilaterally.  Skin:    General: Skin is warm and dry.     Findings: No erythema or rash.  Neurological:     General: No focal deficit present.     Mental Status: She is alert and oriented to person, place, and time.     Motor: No weakness or abnormal muscle tone.     Coordination: Coordination normal.  Psychiatric:        Mood and Affect: Mood normal.        Behavior: Behavior normal.     Imaging: No results found.

## 2021-12-13 NOTE — Procedures (Signed)
EMG & NCV Findings: ?Evaluation of the right median motor nerve showed prolonged distal onset latency (4.9 ms) and decreased conduction velocity (Elbow-Wrist, 46 m/s).  The right median (across palm) sensory nerve showed prolonged distal peak latency (Wrist, 5.1 ms) and prolonged distal peak latency (Palm, 3.3 ms).  All remaining nerves (as indicated in the following tables) were within normal limits.   ? ?All examined muscles (as indicated in the following table) showed no evidence of electrical instability.   ? ?Impression: ?The above electrodiagnostic study is ABNORMAL and reveals evidence of a moderate right median nerve entrapment at the wrist (carpal tunnel syndrome) affecting sensory and motor components.  ? ?There is no significant electrodiagnostic evidence of any other focal nerve entrapment, brachial plexopathy or cervical radiculopathy.  ? ?Recommendations: ?1.  Follow-up with referring physician. ?2.  Continue current management of symptoms. ?3.  Continue use of resting splint at night-time and as needed during the day. ?4.  Suggest surgical evaluation. ? ?___________________________ ?Naaman Plummer FAAPMR ?Board Certified, Biomedical engineer of Physical Medicine and Rehabilitation ? ? ? ?Nerve Conduction Studies ?Anti Sensory Summary Table ? ? Stim Site NR Peak (ms) Norm Peak (ms) P-T Amp (?V) Norm P-T Amp Site1 Site2 Delta-P (ms) Dist (cm) Vel (m/s) Norm Vel (m/s)  ?Right Median Acr Palm Anti Sensory (2nd Digit)  30.6?C  ?Wrist    *5.1 <3.6 12.6 >10 Wrist Palm 1.8 0.0    ?Palm    *3.3 <2.0 3.6         ?Right Radial Anti Sensory (Base 1st Digit)  31?C  ?Wrist    2.0 <3.1 20.4  Wrist Base 1st Digit 2.0 0.0    ?Right Ulnar Anti Sensory (5th Digit)  31.1?C  ?Wrist    3.7 <3.7 17.1 >15.0 Wrist 5th Digit 3.7 14.0 38 >38  ? ?Motor Summary Table ? ? Stim Site NR Onset (ms) Norm Onset (ms) O-P Amp (mV) Norm O-P Amp Site1 Site2 Delta-0 (ms) Dist (cm) Vel (m/s) Norm Vel (m/s)  ?Right Median Motor (Abd Poll Brev)   31.2?C    Martin-Gruber  ?Wrist    *4.9 <4.2 5.0 >5 Elbow Wrist 4.6 21.0 *46 >50  ?Elbow    9.5  5.3         ?Right Ulnar Motor (Abd Dig Min)  31.1?C  ?Wrist    3.7 <4.2 5.6 >3 B Elbow Wrist 3.4 19.0 56 >53  ?B Elbow    7.1  6.7  A Elbow B Elbow 1.5 10.0 67 >53  ?A Elbow    8.6  5.8         ? ?EMG ? ? Side Muscle Nerve Root Ins Act Fibs Psw Amp Dur Poly Recrt Int Dennie Bible Comment  ?Right Abd Poll Brev Median C8-T1 Nml Nml Nml Nml Nml 0 Nml Nml   ?Right 1stDorInt Ulnar C8-T1 Nml Nml Nml Nml Nml 0 Nml Nml   ?Right PronatorTeres Median C6-7 Nml Nml Nml Nml Nml 0 Nml Nml   ?Right Biceps Musculocut C5-6 Nml Nml Nml Nml Nml 0 Nml Nml   ?Right Deltoid Axillary C5-6 Nml Nml Nml Nml Nml 0 Nml Nml   ? ? ?Nerve Conduction Studies ?Anti Sensory Left/Right Comparison ? ? Stim Site L Lat (ms) R Lat (ms) L-R Lat (ms) L Amp (?V) R Amp (?V) L-R Amp (%) Site1 Site2 L Vel (m/s) R Vel (m/s) L-R Vel (m/s)  ?Median Acr Palm Anti Sensory (2nd Digit)  30.6?C  ?Wrist  *5.1   12.6  Wrist  Palm     ?Palm  *3.3   3.6        ?Radial Anti Sensory (Base 1st Digit)  31?C  ?Wrist  2.0   20.4  Wrist Base 1st Digit     ?Ulnar Anti Sensory (5th Digit)  31.1?C  ?Wrist  3.7   17.1  Wrist 5th Digit  38   ? ?Motor Left/Right Comparison ? ? Stim Site L Lat (ms) R Lat (ms) L-R Lat (ms) L Amp (mV) R Amp (mV) L-R Amp (%) Site1 Site2 L Vel (m/s) R Vel (m/s) L-R Vel (m/s)  ?Median Motor (Abd Poll Brev)  31.2?C    Martin-Gruber  ?Wrist  *4.9   5.0  Elbow Wrist  *46   ?Elbow  9.5   5.3        ?Ulnar Motor (Abd Dig Min)  31.1?C  ?Wrist  3.7   5.6  B Elbow Wrist  56   ?B Elbow  7.1   6.7  A Elbow B Elbow  67   ?A Elbow  8.6   5.8        ? ? ? ?Waveforms: ?    ? ?   ? ? ?

## 2021-12-14 ENCOUNTER — Encounter (HOSPITAL_BASED_OUTPATIENT_CLINIC_OR_DEPARTMENT_OTHER): Payer: Self-pay | Admitting: Orthopedic Surgery

## 2021-12-14 ENCOUNTER — Other Ambulatory Visit: Payer: Self-pay

## 2021-12-21 ENCOUNTER — Encounter (HOSPITAL_BASED_OUTPATIENT_CLINIC_OR_DEPARTMENT_OTHER)
Admission: RE | Admit: 2021-12-21 | Discharge: 2021-12-21 | Disposition: A | Discharge status: Home / Self Care | Payer: 59 | Source: Ambulatory Visit | Attending: Orthopedic Surgery | Admitting: Orthopedic Surgery

## 2021-12-21 DIAGNOSIS — Z79899 Other long term (current) drug therapy: Secondary | ICD-10-CM | POA: Diagnosis not present

## 2021-12-21 DIAGNOSIS — F32A Depression, unspecified: Secondary | ICD-10-CM | POA: Diagnosis not present

## 2021-12-21 DIAGNOSIS — K219 Gastro-esophageal reflux disease without esophagitis: Secondary | ICD-10-CM | POA: Diagnosis not present

## 2021-12-21 DIAGNOSIS — Z6841 Body Mass Index (BMI) 40.0 and over, adult: Secondary | ICD-10-CM | POA: Diagnosis not present

## 2021-12-21 DIAGNOSIS — E119 Type 2 diabetes mellitus without complications: Secondary | ICD-10-CM | POA: Diagnosis not present

## 2021-12-21 DIAGNOSIS — G5601 Carpal tunnel syndrome, right upper limb: Secondary | ICD-10-CM | POA: Diagnosis present

## 2021-12-21 DIAGNOSIS — E039 Hypothyroidism, unspecified: Secondary | ICD-10-CM | POA: Diagnosis not present

## 2021-12-21 DIAGNOSIS — Z01810 Encounter for preprocedural cardiovascular examination: Secondary | ICD-10-CM | POA: Diagnosis present

## 2021-12-21 DIAGNOSIS — F419 Anxiety disorder, unspecified: Secondary | ICD-10-CM | POA: Diagnosis not present

## 2021-12-21 DIAGNOSIS — Z87891 Personal history of nicotine dependence: Secondary | ICD-10-CM | POA: Diagnosis not present

## 2021-12-21 DIAGNOSIS — I10 Essential (primary) hypertension: Secondary | ICD-10-CM | POA: Diagnosis not present

## 2021-12-21 DIAGNOSIS — Z7984 Long term (current) use of oral hypoglycemic drugs: Secondary | ICD-10-CM | POA: Diagnosis not present

## 2021-12-21 LAB — BASIC METABOLIC PANEL
Anion gap: 7 (ref 5–15)
BUN: 21 mg/dL — ABNORMAL HIGH (ref 6–20)
CO2: 27 mmol/L (ref 22–32)
Calcium: 9.4 mg/dL (ref 8.9–10.3)
Chloride: 103 mmol/L (ref 98–111)
Creatinine, Ser: 0.86 mg/dL (ref 0.44–1.00)
GFR, Estimated: >60 mL/min (ref >60)
Glucose, Bld: 126 mg/dL — ABNORMAL HIGH (ref 70–99)
Potassium: 5 mmol/L (ref 3.5–5.1)
Sodium: 137 mmol/L (ref 135–145)

## 2021-12-21 NOTE — Progress Notes (Signed)

## 2021-12-21 NOTE — Progress Notes (Signed)
Anesthesia consult per Dr. Ellender, will proceed with surgery as scheduled.  

## 2021-12-22 ENCOUNTER — Ambulatory Visit (HOSPITAL_BASED_OUTPATIENT_CLINIC_OR_DEPARTMENT_OTHER): Payer: 59 | Admitting: Anesthesiology

## 2021-12-22 ENCOUNTER — Ambulatory Visit (HOSPITAL_BASED_OUTPATIENT_CLINIC_OR_DEPARTMENT_OTHER)
Admission: RE | Admit: 2021-12-22 | Discharge: 2021-12-22 | Disposition: A | Discharge status: Home / Self Care | Payer: 59 | Attending: Orthopedic Surgery | Admitting: Orthopedic Surgery

## 2021-12-22 ENCOUNTER — Encounter (HOSPITAL_BASED_OUTPATIENT_CLINIC_OR_DEPARTMENT_OTHER): Payer: Self-pay | Admitting: Orthopedic Surgery

## 2021-12-22 ENCOUNTER — Encounter (HOSPITAL_BASED_OUTPATIENT_CLINIC_OR_DEPARTMENT_OTHER)
Admission: RE | Disposition: A | Discharge status: Home / Self Care | Payer: Self-pay | Source: Home / Self Care | Attending: Orthopedic Surgery

## 2021-12-22 ENCOUNTER — Other Ambulatory Visit: Payer: Self-pay

## 2021-12-22 DIAGNOSIS — Z7984 Long term (current) use of oral hypoglycemic drugs: Secondary | ICD-10-CM | POA: Insufficient documentation

## 2021-12-22 DIAGNOSIS — E039 Hypothyroidism, unspecified: Secondary | ICD-10-CM | POA: Insufficient documentation

## 2021-12-22 DIAGNOSIS — I10 Essential (primary) hypertension: Secondary | ICD-10-CM | POA: Insufficient documentation

## 2021-12-22 DIAGNOSIS — G5601 Carpal tunnel syndrome, right upper limb: Secondary | ICD-10-CM | POA: Insufficient documentation

## 2021-12-22 DIAGNOSIS — F419 Anxiety disorder, unspecified: Secondary | ICD-10-CM | POA: Insufficient documentation

## 2021-12-22 DIAGNOSIS — E119 Type 2 diabetes mellitus without complications: Secondary | ICD-10-CM | POA: Insufficient documentation

## 2021-12-22 DIAGNOSIS — Z87891 Personal history of nicotine dependence: Secondary | ICD-10-CM | POA: Insufficient documentation

## 2021-12-22 DIAGNOSIS — K219 Gastro-esophageal reflux disease without esophagitis: Secondary | ICD-10-CM | POA: Insufficient documentation

## 2021-12-22 DIAGNOSIS — F32A Depression, unspecified: Secondary | ICD-10-CM | POA: Insufficient documentation

## 2021-12-22 DIAGNOSIS — Z6841 Body Mass Index (BMI) 40.0 and over, adult: Secondary | ICD-10-CM | POA: Insufficient documentation

## 2021-12-22 DIAGNOSIS — Z79899 Other long term (current) drug therapy: Secondary | ICD-10-CM | POA: Insufficient documentation

## 2021-12-22 HISTORY — DX: Hypothyroidism, unspecified: E03.9

## 2021-12-22 HISTORY — PX: CARPAL TUNNEL RELEASE: SHX101

## 2021-12-22 LAB — POCT PREGNANCY, URINE: Preg Test, Ur: NEGATIVE

## 2021-12-22 LAB — GLUCOSE, CAPILLARY: Glucose-Capillary: 135 mg/dL — ABNORMAL HIGH (ref 70–99)

## 2021-12-22 SURGERY — CARPAL TUNNEL RELEASE
Anesthesia: Monitor Anesthesia Care | Site: Wrist | Laterality: Right

## 2021-12-22 MED ORDER — LIDOCAINE HCL (PF) 1 % IJ SOLN
INTRAMUSCULAR | Status: DC | PRN
  Administered 2021-12-22: 4 mL

## 2021-12-22 MED ORDER — CEFAZOLIN IN SODIUM CHLORIDE 3-0.9 GM/100ML-% IV SOLN
INTRAVENOUS | Status: AC
Start: 2021-12-22 — End: ?
  Filled 2021-12-22: qty 100

## 2021-12-22 MED ORDER — OXYCODONE HCL 5 MG PO TABS: 5.0000 mg | ORAL_TABLET | Freq: Once | ORAL | Status: DC | PRN

## 2021-12-22 MED ORDER — OXYCODONE HCL 5 MG PO TABS: 5.0000 mg | ORAL_TABLET | Freq: Four times a day (QID) | ORAL | 0 refills | Status: AC | PRN

## 2021-12-22 MED ORDER — ONDANSETRON HCL 4 MG/2ML IJ SOLN
INTRAMUSCULAR | Status: AC
  Filled 2021-12-22: qty 2

## 2021-12-22 MED ORDER — ACETAMINOPHEN 160 MG/5ML PO SOLN: 325.0000 mg | ORAL | Status: DC | PRN

## 2021-12-22 MED ORDER — CEFAZOLIN SODIUM-DEXTROSE 2-4 GM/100ML-% IV SOLN
2.0000 g | Freq: Once | INTRAVENOUS | Status: AC
  Administered 2021-12-22: 3 g via INTRAVENOUS

## 2021-12-22 MED ORDER — PROPOFOL 500 MG/50ML IV EMUL
INTRAVENOUS | Status: AC
Start: 2021-12-22 — End: ?
  Filled 2021-12-22: qty 50

## 2021-12-22 MED ORDER — PROPOFOL 10 MG/ML IV BOLUS
INTRAVENOUS | Status: AC
  Filled 2021-12-22: qty 20

## 2021-12-22 MED ORDER — FENTANYL CITRATE (PF) 100 MCG/2ML IJ SOLN
INTRAMUSCULAR | Status: AC
  Filled 2021-12-22: qty 2

## 2021-12-22 MED ORDER — OXYCODONE HCL 5 MG/5ML PO SOLN: 5.0000 mg | Freq: Once | ORAL | Status: DC | PRN

## 2021-12-22 MED ORDER — ACETAMINOPHEN 325 MG PO TABS: 325.0000 mg | ORAL_TABLET | ORAL | Status: DC | PRN

## 2021-12-22 MED ORDER — FENTANYL CITRATE (PF) 100 MCG/2ML IJ SOLN: 25.0000 ug | INTRAMUSCULAR | Status: DC | PRN

## 2021-12-22 MED ORDER — LACTATED RINGERS IV SOLN: INTRAVENOUS | Status: DC

## 2021-12-22 MED ORDER — BUPIVACAINE HCL (PF) 0.25 % IJ SOLN
INTRAMUSCULAR | Status: DC | PRN
  Administered 2021-12-22: 4 mL

## 2021-12-22 MED ORDER — PROPOFOL 500 MG/50ML IV EMUL
INTRAVENOUS | Status: DC | PRN
  Administered 2021-12-22: 75 ug/kg/min via INTRAVENOUS

## 2021-12-22 MED ORDER — 0.9 % SODIUM CHLORIDE (POUR BTL) OPTIME
TOPICAL | Status: DC | PRN
  Administered 2021-12-22: 120 mL

## 2021-12-22 MED ORDER — MEPERIDINE HCL 25 MG/ML IJ SOLN: 6.2500 mg | INTRAMUSCULAR | Status: DC | PRN

## 2021-12-22 MED ORDER — ONDANSETRON HCL 4 MG/2ML IJ SOLN
INTRAMUSCULAR | Status: DC | PRN
  Administered 2021-12-22: 4 mg via INTRAVENOUS

## 2021-12-22 MED ORDER — MIDAZOLAM HCL 5 MG/5ML IJ SOLN
INTRAMUSCULAR | Status: DC | PRN
Start: 2021-12-22 — End: 2021-12-22
  Administered 2021-12-22: 2 mg via INTRAVENOUS

## 2021-12-22 MED ORDER — FENTANYL CITRATE (PF) 100 MCG/2ML IJ SOLN
INTRAMUSCULAR | Status: DC | PRN
  Administered 2021-12-22: 25 ug via INTRAVENOUS

## 2021-12-22 MED ORDER — PROPOFOL 10 MG/ML IV BOLUS
INTRAVENOUS | Status: DC | PRN
Start: 2021-12-22 — End: 2021-12-22
  Administered 2021-12-22 (×2): 10 mg via INTRAVENOUS
  Administered 2021-12-22: 20 mg via INTRAVENOUS

## 2021-12-22 MED ORDER — MIDAZOLAM HCL 2 MG/2ML IJ SOLN
INTRAMUSCULAR | Status: AC
  Filled 2021-12-22: qty 2

## 2021-12-22 MED ORDER — ONDANSETRON HCL 4 MG/2ML IJ SOLN: 4.0000 mg | Freq: Once | INTRAMUSCULAR | Status: DC | PRN

## 2021-12-22 SURGICAL SUPPLY — 42 items
APL PRP STRL LF DISP 70% ISPRP (MISCELLANEOUS) ×1
BLADE SURG 15 STRL LF DISP TIS (BLADE) ×1 IMPLANT
BLADE SURG 15 STRL SS (BLADE) ×2
BNDG CMPR 9X4 STRL LF SNTH (GAUZE/BANDAGES/DRESSINGS) ×1
BNDG ELASTIC 3X5.8 VLCR STR LF (GAUZE/BANDAGES/DRESSINGS) ×2 IMPLANT
BNDG ESMARK 4X9 LF (GAUZE/BANDAGES/DRESSINGS) ×2 IMPLANT
BNDG GAUZE ELAST 4 BULKY (GAUZE/BANDAGES/DRESSINGS) ×2 IMPLANT
BNDG PLASTER X FAST 3X3 WHT LF (CAST SUPPLIES) IMPLANT
BNDG PLSTR 9X3 FST ST WHT (CAST SUPPLIES)
CHLORAPREP W/TINT 26 (MISCELLANEOUS) ×2 IMPLANT
CORD BIPOLAR FORCEPS 12FT (ELECTRODE) ×2 IMPLANT
COVER BACK TABLE 60X90IN (DRAPES) ×2 IMPLANT
COVER MAYO STAND STRL (DRAPES) ×2 IMPLANT
CUFF TOURN SGL QUICK 18X4 (TOURNIQUET CUFF) IMPLANT
CUFF TOURN SGL QUICK 24 (TOURNIQUET CUFF)
CUFF TRNQT CYL 24X4X16.5-23 (TOURNIQUET CUFF) IMPLANT
DRAPE EXTREMITY T 121X128X90 (DISPOSABLE) ×2 IMPLANT
DRAPE SURG 17X23 STRL (DRAPES) ×2 IMPLANT
GAUZE SPONGE 4X4 12PLY STRL (GAUZE/BANDAGES/DRESSINGS) ×2 IMPLANT
GAUZE XEROFORM 1X8 LF (GAUZE/BANDAGES/DRESSINGS) IMPLANT
GLOVE SURG ENC MOIS LTX SZ7 (GLOVE) ×2 IMPLANT
GLOVE SURG UNDER POLY LF SZ7 (GLOVE) ×2 IMPLANT
GOWN STRL REUS W/ TWL LRG LVL3 (GOWN DISPOSABLE) ×1 IMPLANT
GOWN STRL REUS W/TWL LRG LVL3 (GOWN DISPOSABLE) ×2
GOWN STRL REUS W/TWL XL LVL3 (GOWN DISPOSABLE) ×2 IMPLANT
NDL HYPO 25X1 1.5 SAFETY (NEEDLE) IMPLANT
NEEDLE HYPO 25X1 1.5 SAFETY (NEEDLE) IMPLANT
NS IRRIG 1000ML POUR BTL (IV SOLUTION) ×2 IMPLANT
PACK BASIN DAY SURGERY FS (CUSTOM PROCEDURE TRAY) ×2 IMPLANT
PAD CAST 3X4 CTTN HI CHSV (CAST SUPPLIES) ×1 IMPLANT
PADDING CAST COTTON 3X4 STRL (CAST SUPPLIES) ×2
SLEEVE SCD COMPRESS KNEE MED (STOCKING) IMPLANT
SUCTION FRAZIER HANDLE 10FR (MISCELLANEOUS)
SUCTION TUBE FRAZIER 10FR DISP (MISCELLANEOUS) IMPLANT
SUT ETHILON 4 0 PS 2 18 (SUTURE) ×2 IMPLANT
SUT MNCRL AB 3-0 PS2 18 (SUTURE) IMPLANT
SUT VICRYL 4-0 PS2 18IN ABS (SUTURE) IMPLANT
SYR BULB EAR ULCER 3OZ GRN STR (SYRINGE) ×2 IMPLANT
SYR CONTROL 10ML LL (SYRINGE) IMPLANT
TOWEL GREEN STERILE FF (TOWEL DISPOSABLE) ×4 IMPLANT
TUBE CONNECTING 20X1/4 (TUBING) IMPLANT
UNDERPAD 30X36 HEAVY ABSORB (UNDERPADS AND DIAPERS) ×2 IMPLANT

## 2021-12-22 NOTE — Discharge Instructions (Addendum)
 Maria Hancock, M.D. Hand Surgery  POST-OPERATIVE DISCHARGE INSTRUCTIONS   PRESCRIPTIONS: - You may have been given a prescription to be taken as directed for post-operative pain control.  You may also take over the counter ibuprofen/aleve and tylenol for pain. Take this as directed on the packaging. Do not exceed 3000 mg tylenol/acetaminophen in 24 hours.  Ibuprofen 600-800 mg (3-4) tablets by mouth every 6 hours as needed for pain.   OR  Aleve 2 tablets by mouth every 12 hours (twice daily) as needed for pain.   AND/OR  Tylenol 1000 mg (2 tablets) every 8 hours as needed for pain.  - Please use your pain medication carefully, as refills are limited and you may not be provided with one.  As stated above, please use over the counter pain medicine - it will also be helpful with decreasing your swelling.    ANESTHESIA: -After your surgery, post-surgical discomfort or pain is likely. This discomfort can last several days to a few weeks. At certain times of the day your discomfort may be more intense.   Did you receive a nerve block?   - A nerve block can provide pain relief for one hour to two days after your surgery. As long as the nerve block is working, you will experience little or no sensation in the area the surgeon operated on.  - As the nerve block wears off, you will begin to experience pain or discomfort. It is very important that you begin taking your prescribed pain medication before the nerve block fully wears off. Treating your pain at the first sign of the block wearing off will ensure your pain is better controlled and more tolerable when full-sensation returns. Do not wait until the pain is intolerable, as the medicine will be less effective. It is better to treat pain in advance than to try and catch up.   General Anesthesia:  If you did not receive a nerve block during your surgery, you will need to start taking your pain medication shortly after your surgery and  should continue to do so as prescribed by your surgeon.     ICE AND ELEVATION: - You may use ice for the first 48-72 hours, but it is not critical.   - Motion of your fingers is very important to decrease the swelling.  - Elevation, as much as possible for the next 48 hours, is critical for decreasing swelling as well as for pain relief. Elevation means when you are seated or lying down, you hand should be at or above your heart. When walking, the hand needs to be at or above the level of your elbow.  - If the bandage gets too tight, it may need to be loosened. Please contact our office and we will instruct you in how to do this.    SURGICAL BANDAGES:  - Keep your dressing and/or splint clean and dry at all times.  You can remove your dressing 4 days from now and change with a dry dressing or Band-Aids as needed thereafter. - You may place a plastic bag over your bandage to shower, but be careful, do not get your bandages wet.  - After the bandages have been removed, it is OK to get the stitches wet in a shower or with hand washing. Do Not soak or submerge the wound yet. Please do not use lotions or creams on the stitches.      HAND THERAPY:  - You may not need any. If you   do, we will begin this at your follow up visit in the clinic.    ACTIVITY AND WORK: - You are encouraged to move any fingers which are not in the bandage.  - Light use of the fingers is allowed to assist the other hand with daily hygiene and eating, but strong gripping or lifting is often uncomfortable and should be avoided.  - You might miss a variable period of time from work and hopefully this issue has been discussed prior to surgery. You may not do any heavy work with your affected hand for about 2 weeks.    [facility] [address] [place],  [place]  [address] [phone]  Post Anesthesia Home Care Instructions  Activity: Get plenty of rest for the remainder of the day. A responsible  individual must stay with you for 24 hours following the procedure.  For the next 24 hours, DO NOT: -Drive a car -Operate machinery -Drink alcoholic beverages -Take any medication unless instructed by your physician -Make any legal decisions or sign important papers.  Meals: Start with liquid foods such as gelatin or soup. Progress to regular foods as tolerated. Avoid greasy, spicy, heavy foods. If nausea and/or vomiting occur, drink only clear liquids until the nausea and/or vomiting subsides. Call your physician if vomiting continues.  Special Instructions/Symptoms: Your throat may feel dry or sore from the anesthesia or the breathing tube placed in your throat during surgery. If this causes discomfort, gargle with warm salt water. The discomfort should disappear within 24 hours.  If you had a scopolamine patch placed behind your ear for the management of post- operative nausea and/or vomiting:  1. The medication in the patch is effective for 72 hours, after which it should be removed.  Wrap patch in a tissue and discard in the trash. Wash hands thoroughly with soap and water. 2. You may remove the patch earlier than 72 hours if you experience unpleasant side effects which may include dry mouth, dizziness or visual disturbances. 3. Avoid touching the patch. Wash your hands with soap and water after contact with the patch.         

## 2021-12-22 NOTE — Interval H&P Note (Signed)
History and Physical Interval Note: ? ?12/22/2021 ?9:47 AM ? ?Maria Hancock  has presented today for surgery, with the diagnosis of RIGHT CARPAL TUNNEL SYNDROME.  The various methods of treatment have been discussed with the patient and family. After consideration of risks, benefits and other options for treatment, the patient has consented to  Procedure(s): ?RIGHT CARPAL TUNNEL RELEASE (Right) as a surgical intervention.  The patient's history has been reviewed, patient examined, no change in status, stable for surgery.  I have reviewed the patient's chart and labs.  Questions were answered to the patient's satisfaction.   ? ? ? [name] ? ? ?

## 2021-12-22 NOTE — Anesthesia Postprocedure Evaluation (Signed)
Anesthesia Post Note ? ?Patient: Maria Hancock ? ?Procedure(s) Performed: RIGHT CARPAL TUNNEL RELEASE (Right: Wrist) ? ?  ? ?Patient location during evaluation: PACU ?Anesthesia Type: MAC ?Level of consciousness: awake and alert ?Pain management: pain level controlled ?Vital Signs Assessment: post-procedure vital signs reviewed and stable ?Respiratory status: spontaneous breathing, nonlabored ventilation, respiratory function stable and patient connected to nasal cannula oxygen ?Cardiovascular status: stable and blood pressure returned to baseline ?Postop Assessment: no apparent nausea or vomiting ?Anesthetic complications: no ? ? ?No notable events documented. ? ?Last Vitals:  ?Vitals:  ? 12/22/21 1100 12/22/21 1126  ?BP: 92/73 134/83  ?Pulse: 91 89  ?Resp: 19 18  ?Temp:  36.4 ?C  ?SpO2: 94% 95%  ?  ?Last Pain:  ?Vitals:  ? 12/22/21 1126  ?TempSrc:   ?PainSc: 0-No pain  ? ? ?  ?  ?  ?  ?  ?  ? ?[name] ? ? ? ? ?

## 2021-12-22 NOTE — Op Note (Signed)
? ?  Date of Surgery: 12/22/2021 ? ?INDICATIONS: Maria Hancock is a 56 y.o.-year-old female with right carpal tunnel syndrome that was confirmed with electrodiagnostic studies and has failed conservative management.  Maria Hancock presents today for carpal tunnel release.  Risks, benefits, and alternatives to surgery were again discussed with the patient wishing to proceed with surgery.  Informed consent was signed after our discussion.  ? ?PREOPERATIVE DIAGNOSIS:  ?Right carpal tunnel syndrome ? ?POSTOPERATIVE DIAGNOSIS: Same. ? ?PROCEDURE:  ?Right carpal tunnel release ? ? ?SURGEON: Audria Nine, M.D. ? ?ASSIST:  ? ?ANESTHESIA:  Local, MAC ? ?IV FLUIDS AND URINE: See anesthesia. ? ?ESTIMATED BLOOD LOSS: <5 mL. ? ?IMPLANTS: * No implants in log *  ? ?DRAINS: None ? ?COMPLICATIONS: None ? ?DESCRIPTION OF PROCEDURE: ? ?The patient was met in the preoperative holding area where the surgical site was marked and the consent form was verified.  The patient was then taken to the operating room and transferred to the operating table.  All bony prominences were well padded.  A tourniquet was applied to the right forearm.  Monitored sedation was induced.   A formal time-out was performed to confirm that this was the correct patient, surgery, side, and site. A local block was performed using 10cc of 1% plain lidocaine and 0.25% plain bupivicaine. The operative extremity was prepped and draped in the usual and sterile fashion.   ? ?Following a second timeout, the limb was exsanguinated and the tourniquet inflated to 250 mmHg.  A longitudinal incision was made in line with the radial border of the ring finger from distal to the wrist flexion crease to the intersection of Kaplan's cardinal line.  The skin and subcutaneous tissue was sharply divided.  The longitudinally running palmar fascia was incised.  The transverse carpal ligament was divided from proximal to distal until the fat surrounding the palmar arch was encountered.  Retractors were then placed in the proximal aspect of the wound to visualize the distal antebrachial fascia.  The fascia was sharply divided under direct visualization.   The wound was then thoroughly irrigated with sterile saline.  The tourniquet was deflated.  Hemostasis was achieved with direct pressure and bipolar electrocautery.  The wound was then closed with 4-0 nylon sutures in a horizontal mattress fashion. The wound was then dressed with xeroform, folded kerlix, and an ace wrap. ? ?The patient was then reversed from anesthesia and transferred to the postoperative bed.  All counts were correct x 2 at the end of the procedure.  The patient was taken to the recovery unit in stable condition.    ? ?POSTOPERATIVE PLAN: Maria Hancock will be discharged to home with appropriate pain medication and discharge instructions.  I will see her back in 10-14 days for her first postop visit.  ? ?Audria Nine, MD ?10:39 AM  ?

## 2021-12-22 NOTE — Brief Op Note (Signed)
12/22/2021 ? ?10:35 AM ? ?PATIENT:  Merrisa Skorupski Wendorff  56 y.o. female ? ?PRE-OPERATIVE DIAGNOSIS:  RIGHT CARPAL TUNNEL SYNDROME ? ?POST-OPERATIVE DIAGNOSIS:  RIGHT CARPAL TUNNEL SYNDROME ? ?PROCEDURE:  Procedure(s): ?RIGHT CARPAL TUNNEL RELEASE (Right) ? ?SURGEON:  Surgeon(s) and Role: ?   * Sherilyn Cooter, MD - Primary ? ?PHYSICIAN ASSISTANT:  ? ?ASSISTANTS: none  ? ?ANESTHESIA:   local and MAC ? ?EBL:  0 mL  ? ?BLOOD ADMINISTERED:none ? ?DRAINS: none  ? ?LOCAL MEDICATIONS USED:  BUPIVICAINE  and LIDOCAINE  ? ?SPECIMEN:  No Specimen ? ?DISPOSITION OF SPECIMEN:  N/A ? ?COUNTS:  YES ? ?TOURNIQUET:   ?Total Tourniquet Time Documented: ?Forearm (Right) - 12 minutes ?Total: Forearm (Right) - 12 minutes ? ? ?DICTATION: .Dragon Dictation ? ?PLAN OF CARE: Discharge to home after PACU ? ?PATIENT DISPOSITION:  PACU - hemodynamically stable. ?  ?Delay start of Pharmacological VTE agent (>24hrs) due to surgical blood loss or risk of bleeding: not applicable ? ?

## 2021-12-22 NOTE — Anesthesia Procedure Notes (Signed)
Procedure Name: [place] ?Date/Time: 12/22/2021 10:02 AM ?Performed by: Glory Buff, CRNA ?Pre-anesthesia Checklist: Patient identified, Emergency Drugs available, Suction available and Patient being monitored ?Patient Re-evaluated:Patient Re-evaluated prior to induction ?Oxygen Delivery Method: Simple face mask ? ? ? ? ?

## 2021-12-22 NOTE — Anesthesia Preprocedure Evaluation (Addendum)
Anesthesia Evaluation  ?Patient identified by MRN, date of birth, ID band ?Patient awake ? ? ? ?Reviewed: ?Allergy & Precautions, H&P , NPO status , Patient's Chart, lab work & pertinent test results ? ?Airway ?Mallampati: I ? ?TM Distance: >3 FB ?Neck ROM: full ? ? ? Dental ? ?(+) Missing, Dental Advisory Given, Partial Upper,  ?  ?Pulmonary ?former smoker,  ?  ?Pulmonary exam normal ?breath sounds clear to auscultation ? ? ? ? ? ? Cardiovascular ?hypertension, Pt. on medications ? ?Rhythm:regular Rate:Normal ? ? ?  ?Neuro/Psych ? Headaches, PSYCHIATRIC DISORDERS Anxiety Depression   ? GI/Hepatic ?GERD  Medicated,  ?Endo/Other  ?diabetes, Oral Hypoglycemic AgentsHypothyroidism Morbid obesity ? Renal/GU ?  ? ?  ?Musculoskeletal ? ?(+) Arthritis , Osteoarthritis,   ? Abdominal ?(+) + obese,   ?Peds ? Hematology ?  ?Anesthesia Other Findings ? ? Reproductive/Obstetrics ? ?  ? ? ? ? ? ? ? ? ? ? ? ? ? ?  ?  ? ? ? ? ? ? ?Anesthesia Physical ? ?Anesthesia Plan ? ?ASA: 3 ? ?Anesthesia Plan: MAC  ? ?Post-op Pain Management: Minimal or no pain anticipated  ? ?Induction: Intravenous ? ?PONV Risk Score and Plan: 2 and Treatment may vary due to age or medical condition ? ?Airway Management Planned: Natural Airway and Nasal Cannula ? ?Additional Equipment: None ? ?Intra-op Plan:  ? ?Post-operative Plan:  ? ?Informed Consent: I have reviewed the patients History and Physical, chart, labs and discussed the procedure including the risks, benefits and alternatives for the proposed anesthesia with the patient or authorized representative who has indicated his/her understanding and acceptance.  ? ? ? ? ? ?Plan Discussed with: CRNA, Anesthesiologist and Surgeon ? ?Anesthesia Plan Comments:   ? ? ? ? ? ?Anesthesia Quick Evaluation ? ?

## 2021-12-22 NOTE — Transfer of Care (Signed)
Immediate Anesthesia Transfer of Care Note ? ?Patient: Maria Hancock ? ?Procedure(s) Performed: RIGHT CARPAL TUNNEL RELEASE (Right: Wrist) ? ?Patient Location: PACU ? ?Anesthesia Type:MAC ? ?Level of Consciousness: drowsy, patient cooperative and responds to stimulation ? ?Airway & Oxygen Therapy: Patient Spontanous Breathing and Patient connected to face mask oxygen ? ?Post-op Assessment: Report given to RN and Post -op Vital signs reviewed and stable ? ?Post vital signs: Reviewed and stable ? ?Last Vitals:  ?Vitals Value Taken Time  ?BP    ?Temp    ?Pulse 93 12/22/21 1036  ?Resp 15 12/22/21 1036  ?SpO2 97 % 12/22/21 1036  ?Vitals shown include unvalidated device data. ? ?Last Pain:  ?Vitals:  ? 12/22/21 0927  ?TempSrc: Oral  ?PainSc: 5   ?   ? ?Patients Stated Pain Goal: 3 (12/22/21 3614) ? ?Complications: No notable events documented. ?

## 2021-12-23 ENCOUNTER — Encounter (HOSPITAL_BASED_OUTPATIENT_CLINIC_OR_DEPARTMENT_OTHER): Payer: Self-pay | Admitting: Orthopedic Surgery

## 2022-01-03 ENCOUNTER — Encounter: Payer: Self-pay | Admitting: Orthopedic Surgery

## 2022-01-03 ENCOUNTER — Ambulatory Visit (INDEPENDENT_AMBULATORY_CARE_PROVIDER_SITE_OTHER): Payer: 59 | Admitting: Orthopedic Surgery

## 2022-01-03 DIAGNOSIS — G5601 Carpal tunnel syndrome, right upper limb: Secondary | ICD-10-CM

## 2022-01-03 NOTE — Progress Notes (Signed)
? ?Post-Op Visit Note ?  ?Patient: Maria Hancock           ?Date of Birth: [birth year]           ?MRN: [id number] ?Visit Date: 01/03/2022 ?[facility]P: [facility] ? ? ?Assessment & Plan: ? ?Chief Complaint:  ?Chief Complaint  ?Patient presents with  ? Right Hand - Pain  ? ?Visit Diagnoses:  ?1. Carpal tunnel syndrome, right upper limb   ? ? ?Plan: Patient is 12 days s/p R CTR.  She is doing very well postoperatively.  Her day time and nocturnal numbness and paresthesias have nearly resolved.  Her incision is clean, dry, and well approximated with no surrounding erythema or induration.  Her sutures were removed.  We discussed scar massage for scar softening and maturation.  I can see her back again as needed.  ? ?Follow-Up Instructions: No follow-ups on file.  ? ?Orders:  ?No orders of the defined types were placed in this encounter. ? ?No orders of the defined types were placed in this encounter. ? ? ?Imaging: ?No results found. ? ?PMFS History: ?Patient Active Problem List  ? Diagnosis Date Noted  ? Carpal tunnel syndrome, right upper limb 12/13/2021  ? Atypical chest pain 05/31/2018  ? SOB (shortness of breath) 05/31/2018  ? Lightheadedness 05/31/2018  ? Palpitations 05/31/2018  ? Morbid obesity (HCC) 05/31/2018  ? Plantar fasciitis of left foot 01/26/2018  ? Major depression, recurrent, chronic (HCC) 12/02/2015  ? Osteoarthritis of left knee 02/26/2013  ?  Class: Diagnosis of  ? ?Past Medical History:  ?Diagnosis Date  ? Anxiety   ? Arthritis   ? "knees, back, hands" (02/26/2013)  ? Depression   ? Diabetes mellitus without complication (HCC)   ? GERD (gastroesophageal reflux disease)   ? OTC med as needed  ? Hyperlipidemia   ? Hypertension   ? Hypothyroidism   ? IBS (irritable bowel syndrome)   ? Menorrhagia   ? Migraine headache   ? Psoriasis   ?  ?Family History  ?Problem Relation Age of Onset  ? Cirrhosis Father   ? Heart disease Maternal Grandmother   ?  ?Past Surgical History:  ?Procedure  Laterality Date  ? CARPAL TUNNEL RELEASE Right 12/22/2021  ? Procedure: RIGHT CARPAL TUNNEL RELEASE;  Surgeon: Marlyne Beards, MD;  Location: [facility];  Service: Orthopedics;  Laterality: Right;  ? HARDWARE REMOVAL Left 02/26/2013  ? Procedure: HARDWARE REMOVAL;  Surgeon: Cammy Copa, MD;  Location: [facility] OR;  Service: Orthopedics;  Laterality: Left;  Left Knee Removal of Hardware  ? JOINT REPLACEMENT    ? KNEE ARTHROPLASTY Left ?2001  ? "put hardware in" (02/26/2013)  ? KNEE ARTHROSCOPY Left   ? "I've had 2; 2nd one was 2013" (02/26/2013)  ? REPLACEMENT TOTAL KNEE Left 02/26/2013  ? "w/hardware removal" (02/26/2013)  ? TOTAL KNEE ARTHROPLASTY Left 02/26/2013  ? Procedure: TOTAL KNEE ARTHROPLASTY;  Surgeon: Cammy Copa, MD;  Location: [facility] OR;  Service: Orthopedics;  Laterality: Left;  Left Total Knee Arthroplasty ?  ? TUBAL LIGATION  1988  ? ?Social History  ? ?Occupational History  ? Not on file  ?Tobacco Use  ? Smoking status: Former  ?  Packs/day: 0.50  ?  Years: 16.00  ?  Pack years: 8.00  ?  Types: Cigarettes  ?  Quit date: 10/04/1995  ?  Years since quitting: 26.2  ? Smokeless tobacco: Never  ?Vaping Use  ? Vaping Use: Never used  ?  Substance and Sexual Activity  ? Alcohol use: No  ? Drug use: Never  ?  Comment: denied all drug/alcohol/tobacco use  ? Sexual activity: Yes  ?  Birth control/protection: Surgical  ?  Comment: BTL  ? ? ? ?

## 2022-03-03 DIAGNOSIS — G5790 Unspecified mononeuropathy of unspecified lower limb: Secondary | ICD-10-CM | POA: Insufficient documentation

## 2022-03-04 LAB — LAB REPORT - SCANNED
A1c: 6.4
EGFR: 81

## 2022-03-31 ENCOUNTER — Encounter: Payer: Self-pay | Admitting: Physician Assistant

## 2022-03-31 ENCOUNTER — Ambulatory Visit (INDEPENDENT_AMBULATORY_CARE_PROVIDER_SITE_OTHER): Payer: 59 | Admitting: Physician Assistant

## 2022-03-31 ENCOUNTER — Ambulatory Visit (INDEPENDENT_AMBULATORY_CARE_PROVIDER_SITE_OTHER): Payer: 59

## 2022-03-31 DIAGNOSIS — M7541 Impingement syndrome of right shoulder: Secondary | ICD-10-CM

## 2022-03-31 DIAGNOSIS — M25511 Pain in right shoulder: Secondary | ICD-10-CM

## 2022-03-31 DIAGNOSIS — G8929 Other chronic pain: Secondary | ICD-10-CM | POA: Diagnosis not present

## 2022-03-31 MED ORDER — IBUPROFEN 600 MG PO TABS: 600.0000 mg | ORAL_TABLET | Freq: Three times a day (TID) | ORAL | 0 refills | Status: DC | PRN

## 2022-03-31 MED ORDER — BUPIVACAINE HCL 0.25 % IJ SOLN
2.0000 mL | INTRAMUSCULAR | Status: AC | PRN
  Administered 2022-03-31: 2 mL via INTRA_ARTICULAR

## 2022-03-31 MED ORDER — METHYLPREDNISOLONE ACETATE 40 MG/ML IJ SUSP
80.0000 mg | INTRAMUSCULAR | Status: AC | PRN
  Administered 2022-03-31: 80 mg via INTRA_ARTICULAR

## 2022-03-31 MED ORDER — LIDOCAINE HCL 1 % IJ SOLN
2.0000 mL | INTRAMUSCULAR | Status: AC | PRN
  Administered 2022-03-31: 2 mL

## 2022-03-31 NOTE — Progress Notes (Signed)
Office Visit Note   Patient: Maria Hancock           Date of Birth: [birth year]           MRN: [id number] Visit Date: 03/31/2022              Requested by: [facility] [address] [place],  [place] [address] PCP: [facility]  Chief Complaint  Patient presents with  . 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  She denies any injuries.  She says it is gradually been getting worse.  She does have a history of a shoulder surgery with Dr. August Saucer several years ago.  He is also done a knee replacement on her.  She denies any neck pain today any fever chills  Assessment & Plan: Visit Diagnoses:  1. Chronic right shoulder pain     Plan: Patient has significant impingement findings and degenerative changes in the [facility] joint.  I think this may be the cause of her problem.  She does have strength and is limited mostly by pain.  I will go forward with an injection today.  I think it would be appropriate for her to follow-up in about 3 weeks with Dr. August Saucer.  Also discussed with her physical therapy but she does not apparently has a very high co-pay.  I again did give her self-guided exercises to try.  We will also call her in ibuprofen 600 3 times daily as she cannot take meloxicam but is done well with ibuprofen in the past she understands not to take this without food and not to take it with other anti-inflammatories  Follow-Up Instructions: 3 weeks Dr. Johnell Comings Exam  Patient is alert, oriented, no adenopathy, well-dressed, normal affect, normal respiratory effort. Examination of her right shoulder well-healed surgical portals.  She has pain with forward elevation in the mid range.  She is able to hold her arm up however is quite painful.  Also painful with internal rotation.  She has positive empty can test.   She has fair strength with internal/external rotation.  Imaging: No results found. No images are attached to the encounter.  Labs: Lab Results  Component Value Date   HGBA1C 7.3 (H) 12/04/2015   REPTSTATUS 09/22/2021 FINAL 09/19/2021   CULT  09/19/2021    NO GROUP A STREP (S.PYOGENES) ISOLATED Performed at [facility], [address], [place], [place] [address]      Lab Results  Component Value Date   ALBUMIN 3.6 07/22/2016   ALBUMIN 3.8 11/30/2015   ALBUMIN 3.8 11/11/2009    No results found for: "MG" No results found for: "VD25OH"  No results found for: "PREALBUMIN"    Latest Ref Rng & Units 05/23/2018   11:50 AM 04/16/2018   12:23 PM 07/22/2016    8:30 AM  CBC EXTENDED  WBC 3.6 - 11.0 K/uL 6.7  6.8  8.2   RBC 3.80 - 5.20 MIL/uL 4.30  4.26  4.71   Hemoglobin 12.0 - 16.0 g/dL 23.7  62.8  31.5   HCT 35.0 - 47.0 % 40.6  40.2  44.5   Platelets 150 - 440 K/uL 260  269  259   NEUT# 1.4 - 6.5 K/uL   5.5   Lymph# 1.0 - 3.6 K/uL   1.9  There is no height or weight on file to calculate BMI.  Orders:  Orders Placed This Encounter  Procedures  . XR Shoulder Right   No orders of the defined types were placed in this encounter.    Procedures: Large Joint Inj: R subacromial bursa on 03/31/2022 11:30 AM Indications: diagnostic evaluation and pain Details: 25 G 1.5 in needle, posterior approach  Arthrogram: No  Medications: 2 mL lidocaine 1 %; 80 mg methylPREDNISolone acetate 40 MG/ML; 2 mL bupivacaine 0.25 % Outcome: tolerated well, no immediate complications Procedure, treatment alternatives, risks and benefits explained, specific risks discussed. Consent was given by the patient.    Clinical Data: No additional findings.  ROS:  All other systems negative, except as noted in the HPI. Review of Systems  Objective: Vital Signs: There were no vitals taken for this visit.  Specialty Comments:  No specialty comments available.  PMFS  History: Patient Active Problem List   Diagnosis Date Noted  . Carpal tunnel syndrome, right upper limb 12/13/2021  . Atypical chest pain 05/31/2018  . SOB (shortness of breath) 05/31/2018  . Lightheadedness 05/31/2018  . Palpitations 05/31/2018  . Morbid obesity (HCC) 05/31/2018  . Plantar fasciitis of left foot 01/26/2018  . Major depression, recurrent, chronic (HCC) 12/02/2015  . Osteoarthritis of left knee 02/26/2013    Class: Diagnosis of   Past Medical History:  Diagnosis Date  . Anxiety   . Arthritis    "knees, back, hands" (02/26/2013)  . Depression   . Diabetes mellitus without complication (HCC)   . GERD (gastroesophageal reflux disease)    OTC med as needed  . Hyperlipidemia   . Hypertension   . Hypothyroidism   . IBS (irritable bowel syndrome)   . Menorrhagia   . Migraine headache   . Psoriasis     Family History  Problem Relation Age of Onset  . Cirrhosis Father   . Heart disease Maternal Grandmother     Past Surgical History:  Procedure Laterality Date  . CARPAL TUNNEL RELEASE Right 12/22/2021   Procedure: RIGHT CARPAL TUNNEL RELEASE;  Surgeon: Marlyne Beards, MD;  Location: [facility];  Service: Orthopedics;  Laterality: Right;  . HARDWARE REMOVAL Left 02/26/2013   Procedure: HARDWARE REMOVAL;  Surgeon: Cammy Copa, MD;  Location: [facility] OR;  Service: Orthopedics;  Laterality: Left;  Left Knee Removal of Hardware  . JOINT REPLACEMENT    . KNEE ARTHROPLASTY Left ?2001   "put hardware in" (02/26/2013)  . KNEE ARTHROSCOPY Left    "I've had 2; 2nd one was 2013" (02/26/2013)  . REPLACEMENT TOTAL KNEE Left 02/26/2013   "w/hardware removal" (02/26/2013)  . TOTAL KNEE ARTHROPLASTY Left 02/26/2013   Procedure: TOTAL KNEE ARTHROPLASTY;  Surgeon: Cammy Copa, MD;  Location: [facility] OR;  Service: Orthopedics;  Laterality: Left;  Left Total Knee Arthroplasty   . TUBAL LIGATION  1988   Social History   Occupational History  . Not on file   Tobacco Use  . Smoking status: Former    Packs/day: 0.50    Years: 16.00    Total pack years: 8.00    Types: Cigarettes    Quit date: 10/04/1995    Years since quitting: 26.5  . Smokeless tobacco: Never  Vaping Use  . Vaping Use: Never used  Substance and Sexual Activity  . Alcohol use: No  . Drug use: Never    Comment: denied all drug/alcohol/tobacco use  . Sexual activity: Yes    Birth control/protection: Surgical  Comment: BTL

## 2022-04-01 ENCOUNTER — Ambulatory Visit: Payer: 59 | Admitting: Physician Assistant

## 2022-04-21 ENCOUNTER — Ambulatory Visit: Payer: 59 | Admitting: Orthopedic Surgery

## 2022-04-29 ENCOUNTER — Ambulatory Visit: Payer: 59 | Admitting: Orthopedic Surgery

## 2022-05-11 DIAGNOSIS — R0602 Shortness of breath: Secondary | ICD-10-CM | POA: Insufficient documentation

## 2022-05-24 ENCOUNTER — Institutional Professional Consult (permissible substitution): Payer: 59 | Admitting: Student in an Organized Health Care Education/Training Program

## 2022-05-27 ENCOUNTER — Encounter: Payer: Self-pay | Admitting: Student in an Organized Health Care Education/Training Program

## 2022-05-27 ENCOUNTER — Ambulatory Visit (INDEPENDENT_AMBULATORY_CARE_PROVIDER_SITE_OTHER): Payer: 59 | Admitting: Student in an Organized Health Care Education/Training Program

## 2022-05-27 VITALS — BP 128/80 | HR 97 | Temp 97.7°F | Ht 65.0 in | Wt 328.0 lb

## 2022-05-27 DIAGNOSIS — R053 Chronic cough: Secondary | ICD-10-CM

## 2022-05-27 DIAGNOSIS — Z9189 Other specified personal risk factors, not elsewhere classified: Secondary | ICD-10-CM | POA: Diagnosis not present

## 2022-05-27 DIAGNOSIS — R0602 Shortness of breath: Secondary | ICD-10-CM

## 2022-05-27 MED ORDER — FLUTICASONE PROPIONATE 50 MCG/ACT NA SUSP: 2.0000 | Freq: Every day | NASAL | 11 refills | Status: DC

## 2022-05-27 MED ORDER — DESLORATADINE 5 MG PO TABS: 5.0000 mg | ORAL_TABLET | Freq: Every day | ORAL | 2 refills | Status: DC

## 2022-05-27 NOTE — Patient Instructions (Signed)
Today, we discussed your cough and shortness of breath. I have prescribed you an allergy pill as well as flonase to use in each nostril once a day. Wash your nose with a saline spray twice a day (prior to the flonase in the morning).  I have also ordered a breathing test, a CT scan of the chest, and blood work to work up your shortness of breath.  I have also ordered a sleep study to work you up for sleep apnea.

## 2022-05-27 NOTE — Progress Notes (Signed)
Synopsis: Referred in for cough and shortness of breath by [name]  Assessment & Plan:   #Chronic cough  She presents with chronic cough of 1 year in duration, with significant exposure to multiple animals and possible allergens. She also reports GERD thou appears to have a GERD consistent diet. On exam, her lungs are clear but nasal exam notable for congested turbinates.  For workup, I will obtain a CBC w/ differential, a PFT with methacholine challenge test to rule out asthma, and obtain an allergen panel.  For treatment, I will start her on a second generation anti-histamine as well as normal saline nasal spray and flonase. I will also work up her shortness of breath as below.  - Allergen Panel (27) + IGE; Future - CT CHEST HIGH RESOLUTION; Future - CBC with Differential/Platelet; Future - Pulmonary Function Test [facility] Only; Future - desloratadine (CLARINEX) 5 MG tablet; Take 1 tablet (5 mg total) by mouth daily.  Dispense: 30 tablet; Refill: 2 - fluticasone (FLONASE) 50 MCG/ACT nasal spray; Place 2 sprays into both nostrils daily.  Dispense: 18.2 mL; Refill: 11  2. Shortness of breath  Shortness of breath that is mostly exertional. Patient does suffer from morbid obesity and I counseled her on the importance of weight loss. I will obtain pulmonary function with spiromtry, lung volumes, DLCO and a methacholine challenge test. Furthermore, given her exposure to animals and birds, asthma and hypersensitivity pneumonitis are on the differential. I will obtain a high resolution CT scan of the chest to rule out the latter. Finally, I will obtain a TTE to assess LV and RV function as well as PA pressures.  - CT CHEST HIGH RESOLUTION; Future - Pulmonary Function Test [facility] Only; Future - TTE  3. At risk for obstructive sleep apnea  At risk for OSA given her weight. Also at risk for central sleep apnea. I will initiate workup with a home sleep test per patient's  preference.  - Home sleep test; Future   Return in about 4 weeks (around 06/24/2022).  I spent 60 minutes caring for this patient today, including preparing to see the patient, obtaining and/or reviewing separately obtained history, performing a medically appropriate examination and/or evaluation, counseling and educating the patient/family/caregiver, ordering medications, tests, or procedures, and documenting clinical information in the electronic health record  Raechel Chute, MD [facility] 05/27/2022 11:50 AM    End of visit medications:  Meds ordered this encounter  Medications   desloratadine (CLARINEX) 5 MG tablet    Sig: Take 1 tablet (5 mg total) by mouth daily.    Dispense:  30 tablet    Refill:  2   fluticasone (FLONASE) 50 MCG/ACT nasal spray    Sig: Place 2 sprays into both nostrils daily.    Dispense:  18.2 mL    Refill:  11     Current Outpatient Medications:    amLODipine (NORVASC) 5 MG tablet, Take 5 mg by mouth daily., Disp: , Rfl:    ARIPiprazole (ABILIFY) 5 MG tablet, Take 10 mg by mouth daily., Disp: , Rfl:    atorvastatin (LIPITOR) 40 MG tablet, Take 40 mg by mouth daily., Disp: , Rfl:    desloratadine (CLARINEX) 5 MG tablet, Take 1 tablet (5 mg total) by mouth daily., Disp: 30 tablet, Rfl: 2   desvenlafaxine (PRISTIQ) 100 MG 24 hr tablet, Take 100 mg by mouth daily., Disp: , Rfl:    escitalopram (LEXAPRO) 20 MG tablet, Take 20 mg by  mouth daily., Disp: , Rfl:    fluticasone (FLONASE) 50 MCG/ACT nasal spray, Place 2 sprays into both nostrils daily., Disp: 18.2 mL, Rfl: 11   metFORMIN (GLUCOPHAGE) 500 MG tablet, Take 1,000 mg by mouth 2 (two) times daily with a meal., Disp: , Rfl: 5   Potassium 99 MG TABS, Take by mouth., Disp: , Rfl:    pregabalin (LYRICA) 50 MG capsule, Take 100 mg by mouth 3 (three) times daily., Disp: , Rfl:    tolterodine (DETROL) 2 MG tablet, Take 2 mg by mouth 2 (two) times daily., Disp: , Rfl:    Subjective:    PATIENT ID: Maria Hancock GENDER: female DOB: [birth year], MRN: [id number]  Chief Complaint  Patient presents with   pulmonary consult    SOB with exertion and at rest and prod cough unsure of color x1y    HPI  Maria Hancock is a pleasant 56 year old female presenting to clinic for the evaluation of cough as well as shortness of breath.  Patient reports that her symptoms have worsened over the past year.  She now has chronic cough that is productive.  She tells me that her cough had always been there for many years but was sporadic and that she barely noticed it.  Around a year ago, the cough has worsened and now happens throughout the day and every day.  She denies any hemoptysis and does not recall the color of the sputum.  She does not have any associated chest pain, tightness, wheezing, fevers, chills, night sweats, or weight loss.  She does have a runny nose.  She reports having reflux disease and is taking medicine for it.  She only sporadically drinks coffee and denies eating spicy foods/acidic foods/fatty foods.  She has multiple joint and back pains for which she is being seen by orthopedics, a problem that is chronic for her.  Her shortness of breath is mostly with exertion.  She denies any worsening lower extremity edema, chest pain, or tightness.  She is scheduled to see cardiology next week. She reports that she snores heavily and sleeps in a different room than her husband. She was not told that she stops breathing at night.  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  At home, she has dogs, cats, a lizard, and 2 birds.  She has a history of smoking but reports quitting in the 90s, with 16 pack years on her. Denies other inhalational exposure and denies illicit drug use.  Ancillary information including prior medications, full medical/surgical/family/social histories, and PFTs (when available) are listed below and have been reviewed.   Review of Systems   Constitutional:  Negative for chills, diaphoresis, fever and weight loss.  Respiratory:  Positive for cough, sputum production and shortness of breath. Negative for hemoptysis and wheezing.   Cardiovascular:  Negative for chest pain, palpitations and leg swelling.  Gastrointestinal:  Positive for heartburn. Negative for nausea and vomiting.  Musculoskeletal:  Positive for back pain and joint pain.  Skin:  Negative for rash.  Neurological:  Negative for dizziness.     Objective:   Vitals:   05/27/22 1100  BP: 128/80  Pulse: 97  Temp: 97.7 F (36.5 C)  TempSrc: Temporal  SpO2: 94%  Weight: (!) 328 lb (148.8 kg)  Height: 5\' 5"  (1.651 m)   94% on RA BMI Readings from Last 3 Encounters:  05/27/22 54.58 kg/m  12/22/21 51.40 kg/m  11/16/21 53.42 kg/m   Wt Readings  from Last 3 Encounters:  05/27/22 (!) 328 lb (148.8 kg)  12/22/21 (!) 308 lb 13.8 oz (140.1 kg)  07/22/21 (!) 321 lb (145.6 kg)    Physical Exam Constitutional:      Appearance: She is obese. She is not ill-appearing.  HENT:     Head: Normocephalic.     Nose: Congestion and rhinorrhea present.     Mouth/Throat:     Mouth: Mucous membranes are moist.  Eyes:     Pupils: Pupils are equal, round, and reactive to light.  Cardiovascular:     Rate and Rhythm: Normal rate and regular rhythm.     Pulses: Normal pulses.     Heart sounds: No murmur heard. Pulmonary:     Effort: Pulmonary effort is normal.     Breath sounds: Normal breath sounds.  Abdominal:     General: There is distension.     Palpations: Abdomen is soft.  Musculoskeletal:        General: No swelling.     Cervical back: Normal range of motion.  Skin:    General: Skin is warm.  Neurological:     General: No focal deficit present.     Mental Status: She is alert. Mental status is at baseline.       Ancillary Information    Past Medical History:  Diagnosis Date   Anxiety    Arthritis    "knees, back, hands" (02/26/2013)    Depression    Diabetes mellitus without complication (HCC)    GERD (gastroesophageal reflux disease)    OTC med as needed   Hyperlipidemia    Hypertension    Hypothyroidism    IBS (irritable bowel syndrome)    Menorrhagia    Migraine headache    Psoriasis      Family History  Problem Relation Age of Onset   Cirrhosis Father    Heart disease Maternal Grandmother      Past Surgical History:  Procedure Laterality Date   CARPAL TUNNEL RELEASE Right 12/22/2021   Procedure: RIGHT CARPAL TUNNEL RELEASE;  Surgeon: Marlyne Beards, MD;  Location: [facility];  Service: Orthopedics;  Laterality: Right;   HARDWARE REMOVAL Left 02/26/2013   Procedure: HARDWARE REMOVAL;  Surgeon: Cammy Copa, MD;  Location: [facility] OR;  Service: Orthopedics;  Laterality: Left;  Left Knee Removal of Hardware   JOINT REPLACEMENT     KNEE ARTHROPLASTY Left ?2001   "put hardware in" (02/26/2013)   KNEE ARTHROSCOPY Left    "I've had 2; 2nd one was 2013" (02/26/2013)   REPLACEMENT TOTAL KNEE Left 02/26/2013   "w/hardware removal" (02/26/2013)   TOTAL KNEE ARTHROPLASTY Left 02/26/2013   Procedure: TOTAL KNEE ARTHROPLASTY;  Surgeon: Cammy Copa, MD;  Location: [facility] OR;  Service: Orthopedics;  Laterality: Left;  Left Total Knee Arthroplasty    TUBAL LIGATION  1988    Social History   Socioeconomic History   Marital status: Married    Spouse name: Not on file   Number of children: Not on file   Years of education: Not on file   Highest education level: Not on file  Occupational History   Not on file  Tobacco Use   Smoking status: Former    Packs/day: 1.00    Years: 16.00    Total pack years: 16.00    Types: Cigarettes    Quit date: 10/04/1995    Years since quitting: 26.6   Smokeless tobacco: Never  Vaping Use   Vaping Use: Never  used  Substance and Sexual Activity   Alcohol use: No   Drug use: Never    Comment: denied all drug/alcohol/tobacco use   Sexual activity: Yes    Birth  control/protection: Surgical    Comment: BTL  Other Topics Concern   Not on file  Social History Narrative   Not on file   Social Determinants of Health   Financial Resource Strain: Not on file  Food Insecurity: Not on file  Transportation Needs: Not on file  Physical Activity: Not on file  Stress: Not on file  Social Connections: Not on file  Intimate Partner Violence: Not on file     No Known Allergies   CBC    Component Value Date/Time   WBC 6.7 05/23/2018 1150   RBC 4.30 05/23/2018 1150   HGB 14.2 05/23/2018 1150   HCT 40.6 05/23/2018 1150   PLT 260 05/23/2018 1150   MCV 94.4 05/23/2018 1150   MCH 33.1 05/23/2018 1150   MCHC 35.1 05/23/2018 1150   RDW 14.2 05/23/2018 1150   LYMPHSABS 1.9 07/22/2016 0830   MONOABS 0.4 07/22/2016 0830   EOSABS 0.2 07/22/2016 0830   BASOSABS 0.1 07/22/2016 0830    Pulmonary Functions Testing Results:     No data to display          Outpatient Medications Prior to Visit  Medication Sig Dispense Refill   amLODipine (NORVASC) 5 MG tablet Take 5 mg by mouth daily.     ARIPiprazole (ABILIFY) 5 MG tablet Take 10 mg by mouth daily.     atorvastatin (LIPITOR) 40 MG tablet Take 40 mg by mouth daily.     desvenlafaxine (PRISTIQ) 100 MG 24 hr tablet Take 100 mg by mouth daily.     escitalopram (LEXAPRO) 20 MG tablet Take 20 mg by mouth daily.     metFORMIN (GLUCOPHAGE) 500 MG tablet Take 1,000 mg by mouth 2 (two) times daily with a meal.  5   Potassium 99 MG TABS Take by mouth.     pregabalin (LYRICA) 50 MG capsule Take 100 mg by mouth 3 (three) times daily.     tolterodine (DETROL) 2 MG tablet Take 2 mg by mouth 2 (two) times daily.     lisinopril (ZESTRIL) 40 MG tablet Take 40 mg by mouth daily.     TRADJENTA 5 MG TABS tablet Take 5 mg by mouth daily.     No facility-administered medications prior to visit.

## 2022-06-01 ENCOUNTER — Ambulatory Visit (HOSPITAL_COMMUNITY)
Admission: RE | Admit: 2022-06-01 | Discharge: 2022-06-01 | Disposition: A | Discharge status: Home / Self Care | Payer: 59 | Source: Ambulatory Visit | Attending: Student in an Organized Health Care Education/Training Program | Admitting: Student in an Organized Health Care Education/Training Program

## 2022-06-01 DIAGNOSIS — R0602 Shortness of breath: Secondary | ICD-10-CM | POA: Insufficient documentation

## 2022-06-01 LAB — PULMONARY FUNCTION TEST
DL/VA % pred: 99 %
DL/VA: 4.26 ml/min/mmHg/L
DLCO unc % pred: 82 %
DLCO unc: 17.17 ml/min/mmHg
FEF 25-75 Post: 3.82 L/sec
FEF 25-75 Pre: 3.39 L/sec
FEF2575-%Change-Post: 12 %
FEF2575-%Pred-Post: 146 %
FEF2575-%Pred-Pre: 130 %
FEV1-%Change-Post: -2 %
FEV1-%Pred-Post: 93 %
FEV1-%Pred-Pre: 95 %
FEV1-Post: 2.53 L
FEV1-Pre: 2.59 L
FEV1FVC-%Change-Post: 2 %
FEV1FVC-%Pred-Pre: 111 %
FEV6-%Change-Post: -4 %
FEV6-%Pred-Post: 83 %
FEV6-%Pred-Pre: 87 %
FEV6-Post: 2.79 L
FEV6-Pre: 2.93 L
FEV6FVC-%Pred-Post: 103 %
FEV6FVC-%Pred-Pre: 103 %
FVC-%Change-Post: -4 %
FVC-%Pred-Post: 80 %
FVC-%Pred-Pre: 84 %
FVC-Post: 2.79 L
FVC-Pre: 2.93 L
Post FEV1/FVC ratio: 91 %
Post FEV6/FVC ratio: 100 %
Pre FEV1/FVC ratio: 88 %
Pre FEV6/FVC Ratio: 100 %
RV % pred: 87 %
RV: 1.63 L
TLC % pred: 93 %
TLC: 4.72 L

## 2022-06-01 MED ORDER — ALBUTEROL SULFATE (2.5 MG/3ML) 0.083% IN NEBU
2.5000 mg | INHALATION_SOLUTION | Freq: Once | RESPIRATORY_TRACT | Status: AC
  Administered 2022-06-01: 2.5 mg via RESPIRATORY_TRACT

## 2022-06-01 NOTE — Progress Notes (Deleted)
Cardiology Office Note   Date:  06/01/2022   ID:  Maria Hancock, DOB 12-29-1965, MRN [id number]  PCP:  [facility]  Cardiologist:   None Referring:  ***  No chief complaint on file.     History of Present Illness: Maria Hancock is a 56 y.o. female who is referred by *** for evaluation of SOB.    She was seen by Dr. Okey Dupre in 2019.  Perfusion study was low risk.    ***   who is being seen today for the evaluation of chest pain at the request of Dr. Roxan Hockey. She has a history of type 2 diabetes mellitus, GERD, anxiety, psoriasis, and IBS. She presented to the [facility] ED on 04/16/18 with acute onset of chest pain with deep inspiration.  Workup was unrevealing, including negative d-dimer and troponin I x 2.  She returned to the ED a week ago (05/23/18) complaining of shortness of breath and dizziness.  Workup was again unrevealing.  Today, the patient reports that she has continued to have almost constant chest pain and shortness of breath.  She also has frequent lightheadedness that is not positional or related to specific activity.  She is unable to describe her chest pain other than "it just hurts" and is localized to the left side of her chest.  It occasionally radiates to the left arm.  Maximal intensity is 6/10.  It is currently 4/10 and has been present for at least 2 to 3 months.  She endorses associated shortness of breath, nausea, and diaphoresis.  It seems to be worsened somewhat with activity.  She reports feeling lightheaded to the point of almost passing out though she has never passed out completely.  There are no obvious precipitants for these symptoms.  She notes that her fluoxetine was adjusted about 2 weeks ago and levothyroxine also added.  However, her symptoms predate these medication changes.  Maria Hancock denies prior cardiac work-up.  She has chronic swelling of the lower extremities as well as 2 pillow orthopnea.  She also endorses  occasional PND.  She is typically fatigued when she awakens in the morning and has also been told that she snores.  She has never been evaluated for sleep apnea.  She has done some modest exercises in the past, but she has not done anything for at least a month out of concern for these aforementioned symptoms.   Past Medical History:  Diagnosis Date   Anxiety    Arthritis    "knees, back, hands" (02/26/2013)   Depression    Diabetes mellitus without complication (HCC)    GERD (gastroesophageal reflux disease)    OTC med as needed   Hyperlipidemia    Hypertension    Hypothyroidism    IBS (irritable bowel syndrome)    Menorrhagia    Migraine headache    Psoriasis     Past Surgical History:  Procedure Laterality Date   CARPAL TUNNEL RELEASE Right 12/22/2021   Procedure: RIGHT CARPAL TUNNEL RELEASE;  Surgeon: Marlyne Beards, MD;  Location: [facility];  Service: Orthopedics;  Laterality: Right;   HARDWARE REMOVAL Left 02/26/2013   Procedure: HARDWARE REMOVAL;  Surgeon: Cammy Copa, MD;  Location: [facility] OR;  Service: Orthopedics;  Laterality: Left;  Left Knee Removal of Hardware   JOINT REPLACEMENT     KNEE ARTHROPLASTY Left ?2001   "put hardware in" (02/26/2013)   KNEE ARTHROSCOPY Left    "I've had 2; 2nd one  was 2013" (02/26/2013)   REPLACEMENT TOTAL KNEE Left 02/26/2013   "w/hardware removal" (02/26/2013)   TOTAL KNEE ARTHROPLASTY Left 02/26/2013   Procedure: TOTAL KNEE ARTHROPLASTY;  Surgeon: Cammy Copa, MD;  Location: [facility] OR;  Service: Orthopedics;  Laterality: Left;  Left Total Knee Arthroplasty    TUBAL LIGATION  1988     Current Outpatient Medications  Medication Sig Dispense Refill   amLODipine (NORVASC) 5 MG tablet Take 5 mg by mouth daily.     ARIPiprazole (ABILIFY) 5 MG tablet Take 10 mg by mouth daily.     atorvastatin (LIPITOR) 40 MG tablet Take 40 mg by mouth daily.     desloratadine (CLARINEX) 5 MG tablet Take 1 tablet (5 mg total) by mouth  daily. 30 tablet 2   desvenlafaxine (PRISTIQ) 100 MG 24 hr tablet Take 100 mg by mouth daily.     escitalopram (LEXAPRO) 20 MG tablet Take 20 mg by mouth daily.     fluticasone (FLONASE) 50 MCG/ACT nasal spray Place 2 sprays into both nostrils daily. 18.2 mL 11   metFORMIN (GLUCOPHAGE) 500 MG tablet Take 1,000 mg by mouth 2 (two) times daily with a meal.  5   Potassium 99 MG TABS Take by mouth.     pregabalin (LYRICA) 50 MG capsule Take 100 mg by mouth 3 (three) times daily.     tolterodine (DETROL) 2 MG tablet Take 2 mg by mouth 2 (two) times daily.     No current facility-administered medications for this visit.    Allergies:   Patient has no known allergies.    Social History:  The patient  reports that she quit smoking about 26 years ago. Her smoking use included cigarettes. She has a 16.00 pack-year smoking history. She has never used smokeless tobacco. She reports that she does not drink alcohol and does not use drugs.   Family History:  The patient's ***family history includes Cirrhosis in her father; Heart disease in her maternal grandmother.    ROS:  Please see the history of present illness.   Otherwise, review of systems are positive for {NONE DEFAULTED:18576}.   All other systems are reviewed and negative.    PHYSICAL EXAM: VS:  There were no vitals taken for this visit. , BMI There is no height or weight on file to calculate BMI. GENERAL:  Well appearing HEENT:  Pupils equal round and reactive, fundi not visualized, oral mucosa unremarkable NECK:  No jugular venous distention, waveform within normal limits, carotid upstroke brisk and symmetric, no bruits, no thyromegaly LYMPHATICS:  No cervical, inguinal adenopathy LUNGS:  Clear to auscultation bilaterally BACK:  No CVA tenderness CHEST:  Unremarkable HEART:  PMI not displaced or sustained,S1 and S2 within normal limits, no S3, no S4, no clicks, no rubs, *** murmurs ABD:  Flat, positive bowel sounds normal in frequency  in pitch, no bruits, no rebound, no guarding, no midline pulsatile mass, no hepatomegaly, no splenomegaly EXT:  2 plus pulses throughout, no edema, no cyanosis no clubbing SKIN:  No rashes no nodules NEURO:  Cranial nerves II through XII grossly intact, motor grossly intact throughout PSYCH:  Cognitively intact, oriented to person place and time    EKG:  EKG {ACTION; IS/IS LOV:56433295} ordered today. The ekg ordered today demonstrates ***   Recent Labs: 12/21/2021: BUN 21; Creatinine, Ser 0.86; Potassium 5.0; Sodium 137    Lipid Panel No results found for: "CHOL", "TRIG", "HDL", "CHOLHDL", "VLDL", "LDLCALC", "LDLDIRECT"    Wt Readings from Last 3  Encounters:  05/27/22 (!) 328 lb (148.8 kg)  12/22/21 (!) 308 lb 13.8 oz (140.1 kg)  07/22/21 (!) 321 lb (145.6 kg)      Other studies Reviewed: Additional studies/ records that were reviewed today include: ***. Review of the above records demonstrates:  Please see elsewhere in the note.  ***   ASSESSMENT AND PLAN:  SOB:  ***   Current medicines are reviewed at length with the patient today.  The patient {ACTIONS; HAS/DOES NOT HAVE:19233} concerns regarding medicines.  The following changes have been made:  {PLAN; NO CHANGE:13088:s}  Labs/ tests ordered today include: *** No orders of the defined types were placed in this encounter.    Disposition:   FU with ***    Signed, Rollene Rotunda, MD  06/01/2022 9:04 PM     [facility]

## 2022-06-02 ENCOUNTER — Ambulatory Visit: Payer: 59 | Attending: Cardiology | Admitting: Cardiology

## 2022-06-02 ENCOUNTER — Ambulatory Visit: Admission: RE | Admit: 2022-06-02 | Payer: 59 | Source: Ambulatory Visit

## 2022-06-27 ENCOUNTER — Ambulatory Visit: Payer: 59 | Admitting: Student in an Organized Health Care Education/Training Program

## 2022-07-13 ENCOUNTER — Encounter: Payer: Self-pay | Admitting: Psychiatry

## 2022-07-13 ENCOUNTER — Ambulatory Visit (INDEPENDENT_AMBULATORY_CARE_PROVIDER_SITE_OTHER): Payer: 59 | Admitting: Psychiatry

## 2022-07-13 VITALS — BP 122/81 | HR 97 | Temp 98.1°F | Ht 64.0 in | Wt 329.0 lb

## 2022-07-13 DIAGNOSIS — F411 Generalized anxiety disorder: Secondary | ICD-10-CM | POA: Diagnosis not present

## 2022-07-13 DIAGNOSIS — F331 Major depressive disorder, recurrent, moderate: Secondary | ICD-10-CM | POA: Diagnosis not present

## 2022-07-13 DIAGNOSIS — Z9189 Other specified personal risk factors, not elsewhere classified: Secondary | ICD-10-CM

## 2022-07-13 DIAGNOSIS — F4010 Social phobia, unspecified: Secondary | ICD-10-CM | POA: Diagnosis not present

## 2022-07-13 DIAGNOSIS — Z634 Disappearance and death of family member: Secondary | ICD-10-CM | POA: Diagnosis not present

## 2022-07-13 MED ORDER — PROPRANOLOL HCL 10 MG PO TABS: 10.0000 mg | ORAL_TABLET | Freq: Two times a day (BID) | ORAL | 1 refills | Status: DC | PRN

## 2022-07-13 MED ORDER — ARIPIPRAZOLE 5 MG PO TABS: 5.0000 mg | ORAL_TABLET | Freq: Every morning | ORAL | 0 refills | Status: DC

## 2022-07-13 MED ORDER — REXULTI 0.5 MG PO TABS: 0.2500 mg | ORAL_TABLET | Freq: Every day | ORAL | 1 refills | Status: DC

## 2022-07-13 NOTE — Patient Instructions (Signed)
Please call for EKG - [phone]  [email or web address]  [email or web address]  [facility] - [phone]  [facility] - [phone]  [facility] - [phone]   [facility] [phone]  [facility] [phone]   Brexpiprazole Tablets What is this medication? BREXPIPRAZOLE (brex PIP ray zole) treats schizophrenia. It may also be used with antidepressant medication to treat depression. It can also be used to treat agitation caused by Alzheimer disease. It works by balancing the levels of dopamine and serotonin in your brain, substances that help regulate mood, behaviors, and thoughts. It belongs to a group of medications called antipsychotics. Antipsychotic medications can be used to treat several kinds of mental health conditions. This medicine may be used for other purposes; ask your health care provider or pharmacist if you have questions. COMMON BRAND NAME(S): REXULTI What should I tell my care team before I take this medication? They need to know if you have any of these conditions: Dementia Diabetes Difficulty swallowing Have trouble controlling your muscles Have urges you are unable to control (for example, gambling, spending money, or eating) Heart disease High cholesterol History of breast cancer History of stroke Kidney disease Liver disease Low blood counts, like low white cell, platelet, or red cell counts Low blood pressure Parkinson's disease Seizures Suicidal thoughts, plans or attempt; a previous suicide attempt by you or a family member An unusual or allergic reaction to brexpiprazole, other medications, foods, dyes, or preservatives Pregnant or trying to get pregnant Breast-feeding How should I use this medication? Take this medication by mouth with water. Take it as directed on the prescription label at the same time every day. You can take it with or without food. If it upsets your stomach, take it with  food. Keep taking this medication unless your care team tells you to stop. Stopping it too quickly can cause serious side effects. It can also make your condition worse. A special MedGuide will be given to you by the pharmacist with each prescription and refill. Be sure to read this information carefully each time. Talk to your care team about the use of this medication in children. While it may be prescribed for children as young as 13 years for selected conditions, precautions do apply. Overdosage: If you think you have taken too much of this medicine contact a poison control center or emergency room at once. NOTE: This medicine is only for you. Do not share this medicine with others. What if I miss a dose? If you miss a dose, take it as soon as you can. If it is almost time for your next dose, take only that dose. Do not take double or extra doses. What may interact with this medication? Do not take this medication with any of the following: Aripiprazole Metoclopramide This medication may also interact with the following: Antihistamines for allergy, cough, and cold Certain medications for anxiety or sleep Certain medications for depression like amitriptyline, duloxetine, fluoxetine, paroxetine, sertraline Certain medications for fungal infections like fluconazole, itraconazole, ketoconazole Clarithromycin General anesthetics like halothane, isoflurane, methoxyflurane, propofol Levodopa or other medications for Parkinson's disease Medications for blood pressure Medications that relax muscles for surgery Medications for seizures Narcotic medications for pain Phenothiazines like chlorpromazine, prochlorperazine, thioridazine Quinidine Rifampin St. John's Wort This list may not describe all possible interactions. Give your health care provider a list of all the medicines, herbs, non-prescription drugs, or dietary supplements you use. Also tell them if you smoke, drink  alcohol, or use  illegal drugs. Some items may interact with your medicine. What should I watch for while using this medication? Visit your care team for regular checks on your progress. Tell your care team if symptoms do not start to get better or if they get worse. Do not stop taking except on your care team's advice. You may develop a severe reaction. Your care team will tell you how much medication to take. Patients and their families should watch out for new or worsening depression or thoughts of suicide. Also watch out for sudden changes in feelings such as feeling anxious, agitated, panicky, irritable, hostile, aggressive, impulsive, severely restless, overly excited and hyperactive, or not being able to sleep. If this happens, especially at the beginning of antidepressant treatment or after a change in dose, call your care team. You may get dizzy or drowsy. Do not drive, use machinery, or do anything that needs mental alertness until you know how this medication affects you. Do not stand or sit up quickly, especially if you are an older patient. This reduces the risk of dizzy or fainting spells. Alcohol may interfere with the effect of this medication. Avoid alcoholic drinks. There have been reports of increased sexual urges or other strong urges such as gambling while taking this medication. If you experience any of these while taking this medication, you should report this to your care team as soon as possible. This medication may cause dry eyes and blurred vision. If you wear contact lenses you may feel some discomfort. Lubricating drops may help. See your eye care team if the problem does not go away or is severe. This medication may increase blood sugar. Ask your care team if changes in diet or medications are needed if you have diabetes. This medication can cause problems with controlling your body temperature. It can lower the response of your body to cold temperatures. If possible, stay indoors during cold  weather. If you must go outdoors, wear warm clothes. It can also lower the response of your body to heat. Do not overheat. Do not over-exercise. Stay out of the sun when possible. If you must be in the sun, wear cool clothing. Drink plenty of water. If you have trouble controlling your body temperature, call your care team right away. What side effects may I notice from receiving this medication? Side effects that you should report to your care team as soon as possible: Allergic reactions--skin rash, itching, hives, swelling of the face, lips, tongue, or throat High blood sugar (hyperglycemia)--increased thirst or amount of urine, unusual weakness or fatigue, blurry vision High fever, stiff muscles, increased sweating, fast or irregular heartbeat, and confusion, which may be signs of neuroleptic malignant syndrome Infection--fever, chills, cough, sore throat Low blood pressure--dizziness, feeling faint or lightheaded, blurry vision Pain or trouble swallowing Seizures Stroke--sudden numbness or weakness of the face, arm, or leg, trouble speaking, confusion, trouble walking, loss of balance or coordination, dizziness, severe headache, change in vision Thoughts of suicide or self-harm, worsening mood, feelings of depression Uncontrolled and repetitive body movements, muscle stiffness or spasms, tremors or shaking, loss of balance or coordination, restlessness, shuffling walk, which may be signs of extrapyramidal symptoms (EPS) Urges to engage in impulsive behaviors such as gambling, binge eating, sexual activity, or shopping in ways that are unusual for you Side effects that usually do not require medical attention (report to your care team if they continue or are bothersome): Constipation Drowsiness Headache Weight gain This list may not describe  all possible side effects. Call your doctor for medical advice about side effects. You may report side effects to FDA at 1-800-FDA-1088. Where should I  keep my medication? Keep out of the reach of children and pets. Store at room temperature between 20 and 25 degrees C (68 and 77 degrees F). Get rid of any unused medication after the expiration date. To get rid of medications that are no longer needed or have expired: Take the medication to a medication take-back program. Check with your pharmacy or law enforcement to find a location. If you cannot return the medication, check the label or package insert to see if the medication should be thrown out in the garbage or flushed down the toilet. If you are not sure, ask your care team. If it is safe to put it in the trash, take the medication out of the container. Mix the medication with cat litter, dirt, coffee grounds, or other unwanted substance. Seal the mixture in a bag or container. Put it in the trash. NOTE: This sheet is a summary. It may not cover all possible information. If you have questions about this medicine, talk to your doctor, pharmacist, or health care provider.  2023 Elsevier/Gold Standard (2014-04-25 00:00:00) Propranolol; Hydrochlorothiazide, HCTZ Oral Tablet What is this medication? PROPRANOLOL; HYDROCHLOROTHIAZIDE (proe PRAN oh lole; hye droe klor oh THYE a zide) is a combination of a beta blocker and a diuretic. It treats high blood pressure. This medicine may be used for other purposes; ask your health care provider or pharmacist if you have questions. COMMON BRAND NAME(S): Inderide What should I tell my care team before I take this medication? They need to know if you have any of these conditions: circulation problems, or blood vessel disease decreased urine diabetes gout heart disease immune system problems kidney disease liver disease lung or breathing disease, like asthma pheochromocytoma thyroid disease an unusual or allergic reaction to this propranolol, hydrochlorothiazide, sulfa drugs, other drugs, foods, dyes, or preservatives pregnant or trying to get  pregnant breast-feeding How should I use this medication? Take this drug by mouth. Take it as directed on the prescription label at the same time every day. Keep taking it unless your health care provider tells you to stop. Talk to your health care provider about the use of this drug in children. Special care may be needed. Overdosage: If you think you have taken too much of this medicine contact a poison control center or emergency room at once. NOTE: This medicine is only for you. Do not share this medicine with others. What if I miss a dose? If you miss a dose, take it as soon as you can. If it is almost time for your next dose, take only that dose. Do not take double or extra doses. What may interact with this medication? This medicine may also interact with the following medications: aluminum hydroxide gel antipyrine barbiturates like phenobarbital chlorpromazine cimetidine haloperidol lidocaine lithium medicines for diabetes medicines that relax muscles for surgery NSAIDs, medicines for pain and inflammation, like ibuprofen or naproxen phenytoin reserpine rifampin steroid medicines like prednisone or cortisone theophylline thyroid medicines This list may not describe all possible interactions. Give your health care provider a list of all the medicines, herbs, non-prescription drugs, or dietary supplements you use. Also tell them if you smoke, drink alcohol, or use illegal drugs. Some items may interact with your medicine. What should I watch for while using this medication? Visit your doctor or health care professional for regular check-ups.  Contact your doctor right away if your symptoms worsen. Check your blood pressure and pulse rate regularly. Ask your health care professional what your blood pressure and pulse rate should be, and when you should contact them. Do not stop taking this medicine suddenly. This could lead to serious heart-related effects. You may get drowsy or  dizzy. Do not drive, use machinery, or do anything that needs mental alertness until you know how this drug affects you. Do not stand or sit up quickly, especially if you are an older patient. This reduces the risk of dizzy or fainting spells. Alcohol can make you more drowsy and dizzy. Avoid alcoholic drinks. This medicine may increase blood sugar. Ask your healthcare provider if changes in diet or medicines are needed if you have diabetes. Do not treat yourself for coughs, colds, or pain while you are taking this medicine without asking your doctor or health care professional for advice. Some ingredients may increase your blood pressure. Check with your doctor or health care professional if you get an attack of severe diarrhea, nausea and vomiting, or if you sweat a lot. The loss of too much body fluid can make it dangerous for you to take this medicine. Talk to your health care professional about your risk of skin cancer. You may be more at risk for skin cancer if you take this medicine. This medicine can make you more sensitive to the sun. Keep out of the sun. If you cannot avoid being in the sun, wear protective clothing and use sunscreen. Do not use sun lamps or tanning beds/booths. What side effects may I notice from receiving this medication? Side effects that you should report to your doctor or health care professional as soon as possible: allergic reactions such as skin rash or itching, hives, swelling of the lips, mouth, tongue, or throat breathing problems changes in vision chest pain eye pain fast or irregular heartbeat feeling faint or lightheaded, falls gout attack muscle pain or cramps pain or difficulty when passing urine pain, tingling, numbness in the hands or feet redness, blistering, peeling or loosening of the skin, including inside the mouth signs and symptoms of high blood sugar such as being more thirsty or hungry or having to urinate more than normal. You may also feel  very tired or have blurry vision. slow heart rate swelling of the legs and ankles unusually weak Side effects that usually do not require medical attention (report to your doctor or health care professional if they continue or are bothersome): change in sex drive or performance diarrhea dry mouth hair loss headache stomach upset This list may not describe all possible side effects. Call your doctor for medical advice about side effects. You may report side effects to FDA at 1-800-FDA-1088. Where should I keep my medication? Keep out of the reach of children and pets. Store at room temperature between 20 and 25 degrees C (68 and 77 degrees F). Protect from moisture. Keep the container tightly closed. Do not freeze. Avoid exposure to extreme heat. Throw away any unused drug after the expiration date. NOTE: This sheet is a summary. It may not cover all possible information. If you have questions about this medicine, talk to your doctor, pharmacist, or health care provider.  2023 Elsevier/Gold Standard (2007-11-10 00:00:00)
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
## 2022-08-16 ENCOUNTER — Telehealth (INDEPENDENT_AMBULATORY_CARE_PROVIDER_SITE_OTHER): Payer: 59 | Admitting: Psychiatry

## 2022-08-16 ENCOUNTER — Encounter: Payer: Self-pay | Admitting: Psychiatry

## 2022-08-16 ENCOUNTER — Ambulatory Visit
Admission: RE | Admit: 2022-08-16 | Discharge: 2022-08-16 | Disposition: A | Discharge status: Home / Self Care | Payer: 59 | Source: Ambulatory Visit | Attending: Psychiatry | Admitting: Psychiatry

## 2022-08-16 DIAGNOSIS — F4010 Social phobia, unspecified: Secondary | ICD-10-CM

## 2022-08-16 DIAGNOSIS — Z9189 Other specified personal risk factors, not elsewhere classified: Secondary | ICD-10-CM

## 2022-08-16 DIAGNOSIS — F411 Generalized anxiety disorder: Secondary | ICD-10-CM | POA: Insufficient documentation

## 2022-08-16 DIAGNOSIS — Z634 Disappearance and death of family member: Secondary | ICD-10-CM | POA: Diagnosis not present

## 2022-08-16 DIAGNOSIS — F331 Major depressive disorder, recurrent, moderate: Secondary | ICD-10-CM | POA: Diagnosis present

## 2022-08-16 NOTE — Progress Notes (Unsigned)
Virtual Visit via Video Note  I connected with Maria Hancock on 08/16/22 at 11:30 AM EST by a video enabled telemedicine application and verified that I am speaking with the correct person using two identifiers.  Location Provider Location : [facility] Patient Location : Home  Participants: Patient , Provider   I discussed the limitations of evaluation and management by telemedicine and the availability of in person appointments. The patient expressed understanding and agreed to proceed.   I discussed the assessment and treatment plan with the patient. The patient was provided an opportunity to ask questions and all were answered. The patient agreed with the plan and demonstrated an understanding of the instructions.   The patient was advised to call back or seek an in-person evaluation if the symptoms worsen or if the condition fails to improve as anticipated.   [facility] MD OP Progress Note  08/17/2022 9:04 AM Maria Hancock  MRN:  [id number]  Chief Complaint:  Chief Complaint  Patient presents with   Follow-up   Medication Refill   Anxiety   Depression   HPI: Maria Hancock is a 56 year old Caucasian female, married, employed, lives in [place] a history of depression, generalized anxiety disorder, social anxiety, bereavement, diabetes mellitus, hyperlipidemia, hypertension, history of left knee arthroplasty, chronic back pain, arthralgia was evaluated by telemedicine today.  Patient today reports she is currently struggling with GI issues, likely has a 'stomach bug', reports she has had diarrhea although it is getting better.  She agrees to talk to her primary care provider if it does not get better.  Patient reports she continues to struggle with depression symptoms, sadness, low motivation, low energy, as well as anxiety, worrying about things and  social anxiety.  She has not been able to get the EKG completed as discussed last visit.  Would like to know more about  Rexulti , wants to make sure it is strong enough for her.  Patient agreeable to check the after visit summary from last visit, she was given information about Rexulti.  Also discussed side effects, medication education in session today.  Patient also has not been able to see a therapist yet however does have upcoming appointment scheduled.  Looks forward to that.  Reports work continues to be stressful.  Sleep is disrupted due to waking up several times at night due to the need to urinate.  Her medication for her bladder problems does not help her at night.  Agrees to talk to primary care provider.  She goes to bed at around 8 PM and wakes up at around 6 AM and she is spending around 10 hours in bed.  Denies any suicidality, homicidality or perceptual disturbances.  Was able to stop taking the Lexapro, currently on the Pristiq.  Denies side effects.  Ran out of the Abilify yesterday.  Agreeable to trial of Rexulti.  Patient denies any other concerns today.  Visit Diagnosis:    ICD-10-CM   1. MDD (major depressive disorder), recurrent episode, moderate (HCC)  F33.1     2. GAD (generalized anxiety disorder)  F41.1     3. Social anxiety disorder  F40.10     4. Bereavement  Z63.4     5. At risk for prolonged QT interval syndrome  Z91.89       Past Psychiatric History: Reviewed past psych History from progress note on 07/13/2022.  Past trials of Cymbalta, Lexapro, Zoloft, Prozac, Abilify.  Past Medical History:  Past Medical History:  Diagnosis Date  Anxiety    Arthritis    "knees, back, hands" (02/26/2013)   Depression    Diabetes mellitus without complication (HCC)    GERD (gastroesophageal reflux disease)    OTC med as needed   Hyperlipidemia    Hypertension    Hypothyroidism    IBS (irritable bowel syndrome)    Menorrhagia    Migraine headache    Psoriasis     Past Surgical History:  Procedure Laterality Date   CARPAL TUNNEL RELEASE Right 12/22/2021   Procedure: RIGHT  CARPAL TUNNEL RELEASE;  Surgeon: Marlyne Beards, MD;  Location: [facility];  Service: Orthopedics;  Laterality: Right;   HARDWARE REMOVAL Left 02/26/2013   Procedure: HARDWARE REMOVAL;  Surgeon: Cammy Copa, MD;  Location: [facility];  Service: Orthopedics;  Laterality: Left;  Left Knee Removal of Hardware   JOINT REPLACEMENT     KNEE ARTHROPLASTY Left ?2001   "put hardware in" (02/26/2013)   KNEE ARTHROSCOPY Left    "I've had 2; 2nd one was 2013" (02/26/2013)   REPLACEMENT TOTAL KNEE Left 02/26/2013   "w/hardware removal" (02/26/2013)   TOTAL KNEE ARTHROPLASTY Left 02/26/2013   Procedure: TOTAL KNEE ARTHROPLASTY;  Surgeon: Cammy Copa, MD;  Location: [facility] OR;  Service: Orthopedics;  Laterality: Left;  Left Total Knee Arthroplasty    TUBAL LIGATION  1988    Family Psychiatric History: Reviewed family psychiatric history from progress note on 07/13/2022.  Family History:  Family History  Problem Relation Age of Onset   Cirrhosis Father    Alcohol abuse Brother    Intellectual disability Maternal Aunt    Heart disease Maternal Grandmother    Intellectual disability Son    Drug abuse Son     Social History: Reviewed social history from progress note on 07/13/2022. Social History   Socioeconomic History   Marital status: Married    Spouse name: Not on file   Number of children: 3   Years of education: Not on file   Highest education level: High school graduate  Occupational History   Not on file  Tobacco Use   Smoking status: Former    Packs/day: 1.00    Years: 16.00    Total pack years: 16.00    Types: Cigarettes    Quit date: 10/04/1995    Years since quitting: 26.8   Smokeless tobacco: Never  Vaping Use   Vaping Use: Never used  Substance and Sexual Activity   Alcohol use: No   Drug use: Not Currently    Types: Marijuana   Sexual activity: Not Currently    Birth control/protection: Surgical    Comment: BTL  Other Topics Concern   Not on file   Social History Narrative   Not on file   Social Determinants of Health   Financial Resource Strain: Not on file  Food Insecurity: Not on file  Transportation Needs: Not on file  Physical Activity: Not on file  Stress: Not on file  Social Connections: Not on file    Allergies: No Known Allergies  Metabolic Disorder Labs: Lab Results  Component Value Date   HGBA1C 7.3 (H) 12/04/2015   MPG 163 12/04/2015   No results found for: "PROLACTIN" No results found for: "CHOL", "TRIG", "HDL", "CHOLHDL", "VLDL", "LDLCALC" Lab Results  Component Value Date   TSH 7.285 (H) 12/04/2015    Therapeutic Level Labs: No results found for: "LITHIUM" No results found for: "VALPROATE" No results found for: "CBMZ"  Current Medications: Current Outpatient Medications  Medication  Sig Dispense Refill   amLODipine (NORVASC) 5 MG tablet Take 5 mg by mouth daily.     atorvastatin (LIPITOR) 40 MG tablet Take 40 mg by mouth daily.     desloratadine (CLARINEX) 5 MG tablet Take 1 tablet (5 mg total) by mouth daily. 30 tablet 2   desvenlafaxine (PRISTIQ) 100 MG 24 hr tablet Take 100 mg by mouth daily.     fluticasone (FLONASE) 50 MCG/ACT nasal spray Place 2 sprays into both nostrils daily. 18.2 mL 11   metFORMIN (GLUCOPHAGE) 1000 MG tablet Take 1,000 mg by mouth 2 (two) times daily.     Meth-Hyo-M Bl-Na Phos-Ph Sal (URIBEL) 118 MG CAPS TAKE ONE CAPSULE BY MOUTH 3 TIMES A DAY AS NEEDED DYSURIA/BLADDER DISCOMFORT     Potassium 99 MG TABS Take by mouth.     pregabalin (LYRICA) 100 MG capsule Take 100 mg by mouth 3 (three) times daily.     propranolol (INDERAL) 10 MG tablet Take 1 tablet (10 mg total) by mouth 2 (two) times daily as needed. For severe anxiety only 60 tablet 1   tolterodine (DETROL) 2 MG tablet Take 2 mg by mouth 2 (two) times daily.     TRADJENTA 5 MG TABS tablet Take 5 mg by mouth daily.     Brexpiprazole (REXULTI) 0.5 MG TABS Take 0.5 tablets (0.25 mg total) by mouth at bedtime. Will send  to pharmacy after reviewing EKG (Patient not taking: Reported on 08/16/2022) 30 tablet 1   No current facility-administered medications for this visit.     Musculoskeletal: Strength & Muscle Tone:  UTA Gait & Station:  Seated Patient leans: N/A  Psychiatric Specialty Exam: Review of Systems  Constitutional:  Positive for fatigue.  Gastrointestinal:  Positive for diarrhea.  Psychiatric/Behavioral:  Positive for decreased concentration, dysphoric mood and sleep disturbance. The patient is nervous/anxious.   All other systems reviewed and are negative.   There were no vitals taken for this visit.There is no height or weight on file to calculate BMI.  General Appearance: Casual  Eye Contact:  Fair  Speech:  Clear and Coherent  Volume:  Normal  Mood:  Anxious and Depressed  Affect:  Congruent  Thought Process:  Goal Directed and Descriptions of Associations: Intact  Orientation:  Full (Time, Place, and Person)  Thought Content: Logical   Suicidal Thoughts:  No  Homicidal Thoughts:  No  Memory:  Immediate;   Fair Recent;   Fair Remote;   Fair  Judgement:  Fair  Insight:  Fair  Psychomotor Activity:  Normal  Concentration:  Concentration: Fair and Attention Span: Fair  Recall:  FiservFair  Fund of Knowledge: Fair  Language: Fair  Akathisia:  No  Handed:  Right  AIMS (if indicated): not done  Assets:  Communication Skills Desire for Improvement Housing Social Support  ADL's:  Intact  Cognition: WNL  Sleep:   restless due to need to urinate   Screenings: AIMS    Flowsheet Row Office Visit from [facility]13/20[facility]) [facility]  AIMS Total Score 0 0      AUDIT    Flowsheet Row [facility] ([facility]  Alcohol Use Disorder Identification Test Final Score (AUDIT) 0      PHQ2-9    Flowsheet Row Video  Visit [facility]16/2022 [facility] Office Visit from 07/13/2022 in [facility]  PHQ-2 Total  Score 4 4  PHQ-9 Total Score 11 15      Flowsheet Row Video Visit from 08/16/2022 in [facility] Office Visit from 07/13/2022 in [facility] Admission (Discharged) from 12/22/2021 in [facility]  C-SSRS RISK CATEGORY No Risk No Risk No Risk        Assessment and Plan: Maria Hancock is a 56 year old Caucasian female, married, employed, has a history of depression, anxiety, multiple medical problems, current diarrhea, currently agreeable to trial of Rexulti for management of depression, will benefit from the following plan.  Plan  MDD-unstable Will consider adding Rexulti 0.25 mg p.o. nightly after reviewing EKG.  Patient advised to call [phone] to get EKG completed.  EKG ordered in the system. Patient ran out of Abilify recently. Continue Pristiq 100 mg p.o. daily Start CBT-patient has upcoming appointment with Ms. Christina Hussami   GAD-unstable Patient to start CBT Pristiq 100 mg p.o. daily   Social anxiety disorder-unstable Patient to start CBT Propranolol 10 mg p.o. twice daily as needed for social anxiety attacks  Bereavement-unstable Referred for grief counseling/CBT  At risk for prolonged QT syndrome-EKG ordered in the system.  Patient to call [phone] to schedule this EKG.  Patient advised to contact the office once EKG completed.  I have reviewed labs as well as medical records from primary care provider at [facility]-most recent TSH-03/04/2022-within normal limits. Will consider repeating labs like hemoglobin A1c, lipid panel, prolactin as needed in the future.  Patient to contact primary care provider for her diarrhea/GI issues.    Collaboration of Care: Collaboration of Care: Referral or follow-up with counselor/therapist AEB  advised to establish care with therapist-has upcoming appointment.  Also advised to contact primary care provider if diarrhea does not get better.  Patient/Guardian was advised Release of Information must be obtained prior to any record release in order to collaborate their care with an outside provider. Patient/Guardian was advised if they have not already done so to contact the registration department to sign all necessary forms in order for [place] to release information regarding their care.   Consent: Patient/Guardian gives verbal consent for treatment and assignment of benefits for services provided during this visit. Patient/Guardian expressed understanding and agreed to proceed.   Follow-up in clinic in 4 to 5 weeks or sooner if needed.  This note was generated in part or whole with voice recognition software. Voice recognition is usually quite accurate but there are transcription errors that can and very often do occur. I apologize for any typographical errors that were not detected and corrected.      Jomarie Longs, MD 08/17/2022, 9:04 AM

## 2022-08-17 ENCOUNTER — Telehealth: Payer: Self-pay | Admitting: Psychiatry

## 2022-08-17 DIAGNOSIS — F331 Major depressive disorder, recurrent, moderate: Secondary | ICD-10-CM

## 2022-08-17 MED ORDER — REXULTI 0.5 MG PO TABS: 0.2500 mg | ORAL_TABLET | Freq: Every day | ORAL | 1 refills | Status: DC

## 2022-08-17 NOTE — Telephone Encounter (Signed)
Contacted patient to discuss EKG-normal sinus rhythm. Okay to start Rexulti-0.25 mg, patient agreeable.

## 2022-08-29 ENCOUNTER — Ambulatory Visit
Admission: EM | Admit: 2022-08-29 | Discharge: 2022-08-29 | Disposition: A | Discharge status: Home / Self Care | Payer: 59 | Attending: Internal Medicine | Admitting: Internal Medicine

## 2022-08-29 ENCOUNTER — Ambulatory Visit (INDEPENDENT_AMBULATORY_CARE_PROVIDER_SITE_OTHER): Payer: 59

## 2022-08-29 DIAGNOSIS — W19XXXA Unspecified fall, initial encounter: Secondary | ICD-10-CM | POA: Diagnosis not present

## 2022-08-29 DIAGNOSIS — M25522 Pain in left elbow: Secondary | ICD-10-CM

## 2022-08-29 DIAGNOSIS — S5002XA Contusion of left elbow, initial encounter: Secondary | ICD-10-CM

## 2022-08-29 MED ORDER — ETODOLAC 500 MG PO TABS: 500.0000 mg | ORAL_TABLET | Freq: Two times a day (BID) | ORAL | 0 refills | Status: AC

## 2022-08-29 NOTE — ED Triage Notes (Signed)
Pt reports she fell on a gate and landed on her left elbow yesterday. Can move it but it hurts. Her elbow is the cause of the pain but it shoots up to her shoulder. Limited ROM.   Pt took alieve and iced it but no relief.

## 2022-08-29 NOTE — ED Provider Notes (Signed)
[facility]    CSN: [id number] Arrival date & time: 08/29/22  1159      History   Chief Complaint No chief complaint on file.   HPI Maria Hancock is a 56 y.o. female.   56 year old female presents today due to concern of an injury to her left elbow.  She states last evening she tripped, falling forward and hitting the medial aspect of her elbow on the top of a baby gate.  She denies any visible bruising or swelling.  She states that extension of her elbow causes increased pain.  She reports it is point tender primarily to the soft tissue of the medial elbow.  She denies any radicular symptoms.  She denies numbness.  She takes naproxen daily, states it did not help.  Patient is a Air traffic controller for living, and states that grooming an animal this morning caused increased pain.     Past Medical History:  Diagnosis Date   Anxiety    Arthritis    "knees, back, hands" (02/26/2013)   Depression    Diabetes mellitus without complication (HCC)    GERD (gastroesophageal reflux disease)    OTC med as needed   Hyperlipidemia    Hypertension    Hypothyroidism    IBS (irritable bowel syndrome)    Menorrhagia    Migraine headache    Psoriasis     Patient Active Problem List   Diagnosis Date Noted   MDD (major depressive disorder), recurrent episode, moderate ([place]) 07/13/2022   GAD (generalized anxiety disorder) 07/13/2022   Social anxiety disorder 07/13/2022   Bereavement 07/13/2022   At risk for prolonged QT interval syndrome 07/13/2022   Impingement syndrome of right shoulder 03/31/2022   Carpal tunnel syndrome, right upper limb 12/13/2021   Unspecified osteoarthritis, unspecified site 09/18/2021   Type 2 diabetes mellitus ([place]) 01/08/2021   Atypical chest pain 05/31/2018   SOB (shortness of breath) 05/31/2018   Lightheadedness 05/31/2018   Palpitations 05/31/2018   Morbid obesity ([place]) 05/31/2018   Plantar fasciitis of left foot 01/26/2018   Major  depression, recurrent, chronic ([place]) 12/02/2015   Osteoarthritis of left knee 02/26/2013    Class: Diagnosis of    Past Surgical History:  Procedure Laterality Date   CARPAL TUNNEL RELEASE Right 12/22/2021   Procedure: RIGHT CARPAL TUNNEL RELEASE;  Surgeon: Sherilyn Cooter, MD;  Location: [facility];  Service: Orthopedics;  Laterality: Right;   HARDWARE REMOVAL Left 02/26/2013   Procedure: HARDWARE REMOVAL;  Surgeon: Meredith Pel, MD;  Location: [facility];  Service: Orthopedics;  Laterality: Left;  Left Knee Removal of Hardware   JOINT REPLACEMENT     KNEE ARTHROPLASTY Left ?2001   "put hardware in" (02/26/2013)   KNEE ARTHROSCOPY Left    "I've had 2; 2nd one was 2013" (02/26/2013)   REPLACEMENT TOTAL KNEE Left 02/26/2013   "w/hardware removal" (02/26/2013)   TOTAL KNEE ARTHROPLASTY Left 02/26/2013   Procedure: TOTAL KNEE ARTHROPLASTY;  Surgeon: Meredith Pel, MD;  Location: [facility];  Service: Orthopedics;  Laterality: Left;  Left Total Knee Arthroplasty    TUBAL LIGATION  1988    OB History   No obstetric history on file.      Home Medications    Prior to Admission medications   Medication Sig Start Date End Date Taking? Authorizing Provider  etodolac (LODINE) 500 MG tablet Take 1 tablet (500 mg total) by mouth 2 (two) times daily with a meal for 10 days. 08/29/22 09/08/22 Yes  Argel Pablo L, PA  amLODipine (NORVASC) 5 MG tablet Take 5 mg by mouth daily.    [provider]  atorvastatin (LIPITOR) 40 MG tablet Take 40 mg by mouth daily.    [provider]  Brexpiprazole (REXULTI) 0.5 MG TABS Take 0.5 tablets (0.25 mg total) by mouth at bedtime. 08/17/22 10/16/22  Jomarie Longs, MD  desloratadine (CLARINEX) 5 MG tablet Take 1 tablet (5 mg total) by mouth daily. 05/27/22 05/27/23  Raechel Chute, MD  desvenlafaxine (PRISTIQ) 100 MG 24 hr tablet Take 100 mg by mouth daily.    [provider]  fluticasone (FLONASE) 50 MCG/ACT nasal spray  Place 2 sprays into both nostrils daily. 05/27/22 05/27/23  Raechel Chute, MD  metFORMIN (GLUCOPHAGE) 1000 MG tablet Take 1,000 mg by mouth 2 (two) times daily. 06/07/22   [provider]  Potassium 99 MG TABS Take by mouth.    [provider]  pregabalin (LYRICA) 100 MG capsule Take 100 mg by mouth 3 (three) times daily. 06/07/22   [provider]  propranolol (INDERAL) 10 MG tablet Take 1 tablet (10 mg total) by mouth 2 (two) times daily as needed. For severe anxiety only 07/13/22   Jomarie Longs, MD  tolterodine (DETROL) 2 MG tablet Take 2 mg by mouth 2 (two) times daily.    [provider]  TRADJENTA 5 MG TABS tablet Take 5 mg by mouth daily. 06/22/22   [provider]  FLUoxetine (PROZAC) 20 MG tablet TAKE 1 AND ONE-HALF TABLETS (30MG  TOTAL) BY MOUTH DAILY. 05/07/18 04/14/20  [provider]    Family History Family History  Problem Relation Age of Onset   Cirrhosis Father    Alcohol abuse Brother    Intellectual disability Maternal Aunt    Heart disease Maternal Grandmother    Intellectual disability Son    Drug abuse Son     Social History Social History   Tobacco Use   Smoking status: Former    Packs/day: 1.00    Years: 16.00    Total pack years: 16.00    Types: Cigarettes    Quit date: 10/04/1995    Years since quitting: 26.9   Smokeless tobacco: Never  Vaping Use   Vaping Use: Never used  Substance Use Topics   Alcohol use: No   Drug use: Not Currently    Types: Marijuana     Allergies   Patient has no known allergies.   Review of Systems Review of Systems As per HPI  Physical Exam Triage Vital Signs ED Triage Vitals  Enc Vitals Group     BP 08/29/22 1249 137/83     Pulse Rate 08/29/22 1249 99     Resp 08/29/22 1249 20     Temp 08/29/22 1249 98.1 F (36.7 C)     Temp Source 08/29/22 1249 Oral     SpO2 08/29/22 1249 93 %     Weight --      Height --      Head Circumference --      Peak Flow --       Pain Score 08/29/22 1255 7     Pain Loc --      Pain Edu? --      Excl. in GC? --    No data found.  Updated Vital Signs BP 137/83 (BP Location: Right Arm)   Pulse 99   Temp 98.1 F (36.7 C) (Oral)   Resp 20   SpO2 93%   Visual Acuity  Right Eye Distance:   Left Eye Distance:   Bilateral Distance:    Right Eye Near:   Left Eye Near:    Bilateral Near:     Physical Exam Vitals and nursing note reviewed.  Constitutional:      General: She is not in acute distress.    Appearance: She is obese. She is not ill-appearing.  Cardiovascular:     Rate and Rhythm: Normal rate.  Pulmonary:     Effort: Pulmonary effort is normal. No respiratory distress.  Musculoskeletal:        General: Tenderness (tenderness to medial soft tissue only, without ecchymosis, abrasion, or mass. No bony tenderness) present. No swelling, deformity or signs of injury. Normal range of motion.     Right lower leg: No edema.     Left lower leg: No edema.  Skin:    General: Skin is warm and dry.     Capillary Refill: Capillary refill takes less than 2 seconds.     Coloration: Skin is not jaundiced.     Findings: No bruising, erythema or rash.  Neurological:     Sensory: No sensory deficit.     Motor: No weakness.     Coordination: Coordination normal.      UC Treatments / Results  Labs (all labs ordered are listed, but only abnormal results are displayed) Labs Reviewed - No data to display  EKG   Radiology DG ELBOW COMPLETE LEFT (3+VIEW)  Result Date: 08/29/2022 CLINICAL DATA:  Fall, pain, limited range of motion, initial encounter. EXAM: LEFT ELBOW - COMPLETE 3+ VIEW COMPARISON:  None Available. FINDINGS: No acute osseous or joint abnormality. Mild soft tissue swelling over the olecranon. IMPRESSION: No acute osseous or joint abnormality.  But Electronically Signed   By: Lorin Picket M.D.   On: 08/29/2022 13:16    Procedures Procedures (including critical care time)  Medications  Ordered in UC Medications - No data to display  Initial Impression / Assessment and Plan / UC Course  I have reviewed the triage vital signs and the nursing notes.  Pertinent labs & imaging results that were available during my care of the patient were reviewed by me and considered in my medical decision making (see chart for details).  Clinical Course as of 08/30/22 2321  Mon Aug 29, 2022  1335 Recheck O2 was 96% [WC]    Clinical Course User Index [WC] Geryl Councilman L, [place]    Contusion L elbow - xray negative for fracture. Pt is having pain to the soft tissues, but given MOI, medial epicondylitis unlikely. Will do short course of etodolac and ice. Pt may use elbow strap if sx persist.   Final Clinical Impressions(s) / UC Diagnoses   Final diagnoses:  Contusion of left elbow, initial encounter     Discharge Instructions      Your x-ray does not show any dislocation or fracture. I suspect your symptoms to be secondary to a contusion, which is a painful bruise and swelling Please apply ice to the area. Stop the naproxen and switch to the etodolac called in today.  Take this twice daily with food as needed. If your symptoms persist, you can purchase a over-the-counter elbow strap which are commonly used for lateral epicondylitis. We also try the golfers elbow rehab stretches attached to this form.      ED Prescriptions     Medication Sig Dispense Auth. Provider   etodolac (LODINE) 500 MG tablet Take 1 tablet (500 mg total) by  mouth 2 (two) times daily with a meal for 10 days. 20 tablet Hallel Denherder L, [place]      PDMP not reviewed this encounter.   Chaney Malling, [place] 08/30/22 2322

## 2022-08-29 NOTE — Discharge Instructions (Addendum)
Your x-ray does not show any dislocation or fracture. I suspect your symptoms to be secondary to a contusion, which is a painful bruise and swelling Please apply ice to the area. Stop the naproxen and switch to the etodolac called in today.  Take this twice daily with food as needed. If your symptoms persist, you can purchase a over-the-counter elbow strap which are commonly used for lateral epicondylitis. We also try the golfers elbow rehab stretches attached to this form.

## 2022-08-31 ENCOUNTER — Encounter (HOSPITAL_COMMUNITY): Payer: Self-pay

## 2022-08-31 ENCOUNTER — Encounter (HOSPITAL_COMMUNITY): Payer: Self-pay | Admitting: Licensed Clinical Social Worker

## 2022-08-31 ENCOUNTER — Ambulatory Visit (INDEPENDENT_AMBULATORY_CARE_PROVIDER_SITE_OTHER): Payer: 59 | Admitting: Orthopaedic Surgery

## 2022-08-31 ENCOUNTER — Ambulatory Visit (INDEPENDENT_AMBULATORY_CARE_PROVIDER_SITE_OTHER): Payer: 59 | Admitting: Licensed Clinical Social Worker

## 2022-08-31 ENCOUNTER — Encounter: Payer: Self-pay | Admitting: Orthopaedic Surgery

## 2022-08-31 DIAGNOSIS — M7541 Impingement syndrome of right shoulder: Secondary | ICD-10-CM | POA: Diagnosis not present

## 2022-08-31 DIAGNOSIS — Z634 Disappearance and death of family member: Secondary | ICD-10-CM

## 2022-08-31 DIAGNOSIS — F331 Major depressive disorder, recurrent, moderate: Secondary | ICD-10-CM

## 2022-08-31 DIAGNOSIS — F411 Generalized anxiety disorder: Secondary | ICD-10-CM | POA: Diagnosis not present

## 2022-08-31 MED ORDER — METHYLPREDNISOLONE ACETATE 40 MG/ML IJ SUSP
80.0000 mg | INTRAMUSCULAR | Status: AC | PRN
  Administered 2022-08-31: 80 mg via INTRA_ARTICULAR

## 2022-08-31 MED ORDER — LIDOCAINE HCL 1 % IJ SOLN
2.0000 mL | INTRAMUSCULAR | Status: AC | PRN
  Administered 2022-08-31: 2 mL

## 2022-08-31 MED ORDER — BUPIVACAINE HCL 0.25 % IJ SOLN
2.0000 mL | INTRAMUSCULAR | Status: AC | PRN
  Administered 2022-08-31: 2 mL via INTRA_ARTICULAR

## 2022-08-31 NOTE — Progress Notes (Signed)
Office Visit Note   Patient: Maria Hancock           Date of Birth: 25-May-1966           MRN: [id number] Visit Date: 08/31/2022              Requested by: [facility], [facility] [address] [place],  [place] [address] PCP: [facility], [facility]   Assessment & Plan: Visit Diagnoses:  1. Impingement syndrome of right shoulder     Plan: [name] returns today for pain in her right shoulder.  She has had ongoing problems with her right shoulder but had an injection 5 months ago which seem to help her quite a bit.  She is status post what sounds like a subacromial decompression with Dr. August Saucer many years ago.  The pain has recently returned.  She denies any particular recent injury.  Examination today did show impingement findings although she did not have any limitation of range of motion.  She was extremely tender over the [facility] joint.  Will go forward with an injection today.  Patient understands that if she had a return of symptoms it might be worth getting an MRI to evaluate for any rotator cuff injury.  Had significant relief of her pain after injecting the [facility] joint and the subacromial space  Follow-Up Instructions: Return if symptoms worsen or fail to improve.   Orders:  Orders Placed This Encounter  Procedures   Large Joint Inj: R subacromial bursa   No orders of the defined types were placed in this encounter.     Procedures: Large Joint Inj: R subacromial bursa on 08/31/2022 9:05 AM Indications: diagnostic evaluation and pain Details: 25 G 1.5 in needle  Arthrogram: No  Medications: 2 mL lidocaine 1 %; 80 mg methylPREDNISolone acetate 40 MG/ML; 2 mL bupivacaine 0.25 % Outcome: tolerated well, no immediate complications Procedure, treatment alternatives, risks and benefits explained, specific risks discussed. Consent was given by the patient.      Clinical Data: No additional findings.   Subjective: Chief Complaint   Patient presents with   Right Shoulder - Pain    HPI pleasant 56 year old woman with known history of right shoulder pain and impingement findings.  She had an injection last about 4 5 months ago seem to help her.  Pain is now returned.  She is requesting another injection  Review of Systems  All other systems reviewed and are negative.    Objective: Vital Signs: There were no vitals taken for this visit.  Physical Exam Constitutional:      Appearance: Normal appearance.  Pulmonary:     Effort: Pulmonary effort is normal.  Skin:    General: Skin is warm and dry.  Neurological:     Mental Status: She is alert.     Ortho Exam Examination of her right shoulder:.  She has full forward elevation but is quite painful through the last 20 degrees of elevation.  She has good external rotation again reproduces some of her symptoms.  She is exquisitely tender over the [facility] joint.  Positive impingement findings.  She is neurovascularly intact. Specialty Comments:  No specialty comments available.  Imaging: No results found.   PMFS History: Patient Active Problem List   Diagnosis Date Noted   MDD (major depressive disorder), recurrent episode, moderate (HCC) 07/13/2022   GAD (generalized anxiety disorder) 07/13/2022   Social anxiety disorder 07/13/2022   Bereavement 07/13/2022   At risk  for prolonged QT interval syndrome 07/13/2022   Impingement syndrome of right shoulder 03/31/2022   Carpal tunnel syndrome, right upper limb 12/13/2021   Unspecified osteoarthritis, unspecified site 09/18/2021   Type 2 diabetes mellitus (HCC) 01/08/2021   Atypical chest pain 05/31/2018   SOB (shortness of breath) 05/31/2018   Lightheadedness 05/31/2018   Palpitations 05/31/2018   Morbid obesity (HCC) 05/31/2018   Plantar fasciitis of left foot 01/26/2018   Major depression, recurrent, chronic (HCC) 12/02/2015   Osteoarthritis of left knee 02/26/2013    Class: Diagnosis of   Past Medical  History:  Diagnosis Date   Anxiety    Arthritis    "knees, back, hands" (02/26/2013)   Depression    Diabetes mellitus without complication (HCC)    GERD (gastroesophageal reflux disease)    OTC med as needed   Hyperlipidemia    Hypertension    Hypothyroidism    IBS (irritable bowel syndrome)    Menorrhagia    Migraine headache    Psoriasis     Family History  Problem Relation Age of Onset   Cirrhosis Father    Alcohol abuse Brother    Intellectual disability Maternal Aunt    Heart disease Maternal Grandmother    Intellectual disability Son    Drug abuse Son     Past Surgical History:  Procedure Laterality Date   CARPAL TUNNEL RELEASE Right 12/22/2021   Procedure: RIGHT CARPAL TUNNEL RELEASE;  Surgeon: Marlyne Beards, MD;  Location: [facility];  Service: Orthopedics;  Laterality: Right;   HARDWARE REMOVAL Left 02/26/2013   Procedure: HARDWARE REMOVAL;  Surgeon: Cammy Copa, MD;  Location: [facility] OR;  Service: Orthopedics;  Laterality: Left;  Left Knee Removal of Hardware   JOINT REPLACEMENT     KNEE ARTHROPLASTY Left ?2001   "put hardware in" (02/26/2013)   KNEE ARTHROSCOPY Left    "I've had 2; 2nd one was 2013" (02/26/2013)   REPLACEMENT TOTAL KNEE Left 02/26/2013   "w/hardware removal" (02/26/2013)   TOTAL KNEE ARTHROPLASTY Left 02/26/2013   Procedure: TOTAL KNEE ARTHROPLASTY;  Surgeon: Cammy Copa, MD;  Location: [facility];  Service: Orthopedics;  Laterality: Left;  Left Total Knee Arthroplasty    TUBAL LIGATION  1988   Social History   Occupational History   Not on file  Tobacco Use   Smoking status: Former    Packs/day: 1.00    Years: 16.00    Total pack years: 16.00    Types: Cigarettes    Quit date: 10/04/1995    Years since quitting: 26.9   Smokeless tobacco: Never  Vaping Use   Vaping Use: Never used  Substance and Sexual Activity   Alcohol use: No   Drug use: Not Currently    Types: Marijuana   Sexual activity: Not Currently     Birth control/protection: Surgical    Comment: BTL

## 2022-08-31 NOTE — Plan of Care (Signed)
  Problem: Depression Goal:  Decrease depressive symptoms and improve levels of effective functioning-pt reports a decrease in overall depression symptoms 3 out of 5 sessions documented.  Outcome: Initial Goal: Develop healthy thinking patterns and beliefs about self, others, and the world that lead to the alleviation and help prevent the relapse of depression per self report 3 out of 5 sessions documented.   Outcome: Initial   Problem: Anxiety  Goal:  Reduce overall frequency, intensity, and duration of the anxiety so that daily functioning is not impaired per pt self report 3 out of 5 sessions documented.   Outcome: Initial Goal: Learn and implement coping skills that result in a reduction of anxiety and worry, and improve daily functioning per pt report 3 out of 5 sessions documented  Outcome: Initial   Developed/revised tx plan based on pt self reported input. Pt verbally agrees with treatment plan at time of session     
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
## 2022-09-27 ENCOUNTER — Encounter: Payer: Self-pay | Admitting: Psychiatry

## 2022-09-27 ENCOUNTER — Telehealth (INDEPENDENT_AMBULATORY_CARE_PROVIDER_SITE_OTHER): Payer: 59 | Admitting: Psychiatry

## 2022-09-27 DIAGNOSIS — F4010 Social phobia, unspecified: Secondary | ICD-10-CM

## 2022-09-27 DIAGNOSIS — F411 Generalized anxiety disorder: Secondary | ICD-10-CM

## 2022-09-27 DIAGNOSIS — F331 Major depressive disorder, recurrent, moderate: Secondary | ICD-10-CM

## 2022-09-27 DIAGNOSIS — Z9189 Other specified personal risk factors, not elsewhere classified: Secondary | ICD-10-CM

## 2022-09-27 DIAGNOSIS — Z634 Disappearance and death of family member: Secondary | ICD-10-CM | POA: Diagnosis not present

## 2022-09-27 MED ORDER — REXULTI 0.5 MG PO TABS: 0.5000 mg | ORAL_TABLET | Freq: Every day | ORAL | 1 refills | Status: DC

## 2022-09-27 NOTE — Progress Notes (Unsigned)
Virtual Visit via Video Note  I connected with Maria Hancock on 09/27/22 at  3:30 PM EST by a video enabled telemedicine application and verified that I am speaking with the correct person using two identifiers.  Location Provider Location : [facility] Patient Location : Home  Participants: Patient , Provider    I discussed the limitations of evaluation and management by telemedicine and the availability of in person appointments. The patient expressed understanding and agreed to proceed.    I discussed the assessment and treatment plan with the patient. The patient was provided an opportunity to ask questions and all were answered. The patient agreed with the plan and demonstrated an understanding of the instructions.   The patient was advised to call back or seek an in-person evaluation if the symptoms worsen or if the condition fails to improve as anticipated.  BH MD OP Progress Note  09/28/2022 12:12 PM Maria Billetheresa A Cozort  MRN:  [id number]  Chief Complaint:  Chief Complaint  Patient presents with   Follow-up   Medication Refill   Anxiety   Depression   HPI: Maria Hancock is a 56 year old Caucasian female, married, employed, lives in [place], has a history of depression, GAD, social anxiety, bereavement, diabetes mellitus, hyperlipidemia, hypertension, history of left knee arthroplasty, chronic back pain, arthralgia was evaluated by telemedicine today.  Patient today reports she is currently tolerating the Rexulti well.  It has definitely helped her mood symptoms to get better.  She does not feel as tired during the day and her sleepiness has improved.   Patient reports she is managing her anxiety better than before.  Reports sleep at night is good.  Continues to take the pregabalin for her pain, it does help with her pain.  Patient appeared to be alert, oriented to person place time situation.  Reports she had a good Christmas holiday with her family.  Denies  any suicidality, homicidality or perceptual disturbances.  Patient denies any other concerns today.  Visit Diagnosis:    ICD-10-CM   1. MDD (major depressive disorder), recurrent episode, moderate (HCC)  F33.1 Brexpiprazole (REXULTI) 0.5 MG TABS    2. GAD (generalized anxiety disorder)  F41.1     3. Social anxiety disorder  F40.10     4. Bereavement  Z63.4     5. At risk for prolonged QT interval syndrome  Z91.89       Past Psychiatric History: Reviewed past psychiatric history from progress note on 07/13/2022.  Past Medical History:  Past Medical History:  Diagnosis Date   Anxiety    Arthritis    "knees, back, hands" (02/26/2013)   Depression    Diabetes mellitus without complication (HCC)    GERD (gastroesophageal reflux disease)    OTC med as needed   Hyperlipidemia    Hypertension    Hypothyroidism    IBS (irritable bowel syndrome)    Menorrhagia    Migraine headache    Psoriasis     Past Surgical History:  Procedure Laterality Date   CARPAL TUNNEL RELEASE Right 12/22/2021   Procedure: RIGHT CARPAL TUNNEL RELEASE;  Surgeon: Marlyne BeardsBenfield, Charlie, MD;  Location: [facility];  Service: Orthopedics;  Laterality: Right;   HARDWARE REMOVAL Left 02/26/2013   Procedure: HARDWARE REMOVAL;  Surgeon: Cammy CopaGregory Scott Dean, MD;  [facility] OR;  Service: Orthopedics;  Laterality: Left;  Left Knee Removal of Hardware   JOINT REPLACEMENT     KNEE ARTHROPLASTY Left ?2001   "put hardware in" (02/26/2013)  KNEE ARTHROSCOPY Left    "I've had 2; 2nd one was 2013" (02/26/2013)   REPLACEMENT TOTAL KNEE Left 02/26/2013   "w/hardware removal" (02/26/2013)   TOTAL KNEE ARTHROPLASTY Left 02/26/2013   Procedure: TOTAL KNEE ARTHROPLASTY;  Surgeon: Cammy Copa, MD;  Location: [facility] OR;  Service: Orthopedics;  Laterality: Left;  Left Total Knee Arthroplasty    TUBAL LIGATION  1988    Family Psychiatric History: Reviewed family psychiatric history from progress note on  07/13/2022  Family History:  Family History  Problem Relation Age of Onset   Cirrhosis Father    Alcohol abuse Brother    Intellectual disability Maternal Aunt    Heart disease Maternal Grandmother    Intellectual disability Son    Drug abuse Son     Social History: Reviewed social history from progress note on 07/13/2022. Social History   Socioeconomic History   Marital status: Married    Spouse name: Not on file   Number of children: 3   Years of education: Not on file   Highest education level: High school graduate  Occupational History   Not on file  Tobacco Use   Smoking status: Former    Packs/day: 1.00    Years: 16.00    Total pack years: 16.00    Types: Cigarettes    Quit date: 10/04/1995    Years since quitting: 27.0   Smokeless tobacco: Never  Vaping Use   Vaping Use: Never used  Substance and Sexual Activity   Alcohol use: No   Drug use: Not Currently    Types: Marijuana   Sexual activity: Not Currently    Birth control/protection: Surgical    Comment: BTL  Other Topics Concern   Not on file  Social History Narrative   Not on file   Social Determinants of Health   Financial Resource Strain: Not on file  Food Insecurity: Not on file  Transportation Needs: Not on file  Physical Activity: Not on file  Stress: Not on file  Social Connections: Not on file    Allergies: No Known Allergies  Metabolic Disorder Labs: Lab Results  Component Value Date   HGBA1C 7.3 (H) 12/04/2015   MPG 163 12/04/2015   No results found for: "PROLACTIN" No results found for: "CHOL", "TRIG", "HDL", "CHOLHDL", "VLDL", "LDLCALC" Lab Results  Component Value Date   TSH 7.285 (H) 12/04/2015    Therapeutic Level Labs: No results found for: "LITHIUM" No results found for: "VALPROATE" No results found for: "CBMZ"  Current Medications: Current Outpatient Medications  Medication Sig Dispense Refill   amLODipine (NORVASC) 5 MG tablet Take 5 mg by mouth daily.      atorvastatin (LIPITOR) 40 MG tablet Take 40 mg by mouth daily.     desloratadine (CLARINEX) 5 MG tablet Take 1 tablet (5 mg total) by mouth daily. 30 tablet 2   desvenlafaxine (PRISTIQ) 100 MG 24 hr tablet Take 100 mg by mouth daily.     fluticasone (FLONASE) 50 MCG/ACT nasal spray Place 2 sprays into both nostrils daily. 18.2 mL 11   metFORMIN (GLUCOPHAGE) 1000 MG tablet Take 1,000 mg by mouth 2 (two) times daily.     Potassium 99 MG TABS Take by mouth.     pregabalin (LYRICA) 100 MG capsule Take 100 mg by mouth 3 (three) times daily.     propranolol (INDERAL) 10 MG tablet Take 1 tablet (10 mg total) by mouth 2 (two) times daily as needed. For severe anxiety only 60 tablet 1  tolterodine (DETROL) 2 MG tablet Take 2 mg by mouth 2 (two) times daily.     TRADJENTA 5 MG TABS tablet Take 5 mg by mouth daily.     Brexpiprazole (REXULTI) 0.5 MG TABS Take 1 tablet (0.5 mg total) by mouth at bedtime. 30 tablet 1   No current facility-administered medications for this visit.     Musculoskeletal: Strength & Muscle Tone:  UTA Gait & Station:  Seated Patient leans: N/A  Psychiatric Specialty Exam: Review of Systems  Musculoskeletal:  Positive for arthralgias.  Psychiatric/Behavioral:  Positive for dysphoric mood. The patient is nervous/anxious.   All other systems reviewed and are negative.   There were no vitals taken for this visit.There is no height or weight on file to calculate BMI.  General Appearance: Casual  Eye Contact:  Fair  Speech:  Clear and Coherent  Volume:  Normal  Mood:  Anxious and Depressed  Affect:  Congruent  Thought Process:  Goal Directed and Descriptions of Associations: Intact  Orientation:  Full (Time, Place, and Person)  Thought Content: Logical   Suicidal Thoughts:  No  Homicidal Thoughts:  No  Memory:  Immediate;   Fair Recent;   Fair Remote;   Fair  Judgement:  Fair  Insight:  Fair  Psychomotor Activity:  Normal  Concentration:  Concentration: Fair and  Attention Span: Fair  Recall:  [employer] of Knowledge: Fair  Language: Fair  Akathisia:  No  Handed:  Right  AIMS (if indicated): done  Assets:  Communication Skills Desire for Improvement Housing Social Support  ADL's:  Intact  Cognition: WNL  Sleep:  Fair   Screenings: AIMS    Flowsheet Row Video Visit from 09/27/2022 in [facility] Office Visit from 07/13/2022 in [facility]) [facility]  AIMS Total Score 0 0 0      AUDIT    Flowsheet Row Ad[facility] ([facility]charged[facility]  Alcohol Use Disorder Identification Test Final Score (AUDIT) 0      GAD-7    Flowsheet Row Counselor from 08/31/2022 in [facility] [place]  Total GAD-7 Score 8      PHQ2-9    Flowsheet Row Counselor from 08/31/2022 in [facility] [place] Video Visit from 08/16/2022 in [facility] Office Visit from 07/13/2022 in [facility]  PHQ-2 Total Score 3 4 4   PHQ-9 Total Score 17 11 15       Flowsheet Row Video Visit from 09/27/2022 in [facility] Counselor from 08/31/2022 in [facility]  ED from 08/29/2022 in [facility]   C-SSRS RISK CATEGORY No Risk No Risk No Risk        Assessment and Plan: Maria Hancock is a 56 year old Caucasian female, married, employed, has a history of depression, anxiety, multiple medical problems was evaluated by telemedicine today.  Patient is currently improving although agreeable to dosage increase of Rexulti, will benefit from following plan.  Plan MDD-improving Increase Rexulti to 0.5 mg p.o. nightly Pristiq 100 mg p.o. daily Patient to have psychotherapy sessions, continue CBT  with Ms. Christina Hussami'  GAD-improving Continue CBT Pristiq 100 mg p.o. daily  Social anxiety disorder-improving Propranolol 10 mg p.o. twice daily as needed for anxiety attacks Continue CBT  Bereavement-improving Continue CBT  At risk for prolonged QT syndrome-reviewed and discussed EKG-08/16/2022-QTc-459-normal sinus rhythm.  Follow-up in clinic in 6 to 8 weeks or sooner if needed.    Collaboration of Care: Collaboration of Care: Referral or follow-up with counselor/therapist AEB patient to continue psychotherapy sessions.  Patient/Guardian was advised Release of Information must be obtained prior to any record release in order to collaborate their care with an outside provider. Patient/Guardian was advised if they have not already done so to contact the registration department to sign all necessary forms in order for [place] to release information regarding their care.   Consent: Patient/Guardian gives verbal consent for treatment and assignment of benefits for services provided during this visit. Patient/Guardian expressed understanding and agreed to proceed.    This note was generated in part or whole with voice recognition software. Voice recognition is usually quite accurate but there are transcription errors that can and very often do occur. I apologize for any typographical errors that were not detected and corrected.     Jomarie Longs, MD 09/28/2022, 12:12 PM

## 2022-10-17 ENCOUNTER — Ambulatory Visit (INDEPENDENT_AMBULATORY_CARE_PROVIDER_SITE_OTHER): Payer: Medicaid Other | Admitting: Licensed Clinical Social Worker

## 2022-10-17 DIAGNOSIS — F331 Major depressive disorder, recurrent, moderate: Secondary | ICD-10-CM | POA: Diagnosis not present

## 2022-10-17 DIAGNOSIS — F411 Generalized anxiety disorder: Secondary | ICD-10-CM

## 2022-10-17 NOTE — Progress Notes (Addendum)
Virtual Visit via Video Note  I connected with Maria Hancock on 10/17/22 at 11:00 AM EST by a video enabled telemedicine application and verified that I am speaking with the correct person using two identifiers.  Location: Patient: home Provider: remote office [place], [place])   I discussed the limitations of evaluation and management by telemedicine and the availability of in person appointments. The patient expressed understanding and agreed to proceed.  I discussed the assessment and treatment plan with the patient. The patient was provided an opportunity to ask questions and all were answered. The patient agreed with the plan and demonstrated an understanding of the instructions.   The patient was advised to call back or seek an in-person evaluation if the symptoms worsen or if the condition fails to improve as anticipated.  I provided 35 minutes of non-face-to-face time during this encounter.   [place], LCSW   THERAPIST PROGRESS NOTE  Session Time: [id number]  Participation Level: Active  Behavioral Response: NeatAlertAnxious  Type of Therapy: Individual Therapy  Treatment Goals addressed: Develop healthy thinking patterns and beliefs about self, others, and the world that lead to the alleviation and help prevent the relapse of depression per self report 3 out of 5 sessions documented   Learn and implement coping skills that result in a reduction of anxiety and worry, and improve daily functioning per pt report 3 out of 5 sessions documented   ProgressTowards Goals: Progressing  Interventions: CBT, Motivational Interviewing, Solution Focused, and Supportive  Summary: Maria Hancock is a 57 y.o. female who presents with improving symptoms related to depression diagnosis. Pt reports that she had a recent medication change that has made a big difference in her overall mood.   Allowed patient to explore thoughts and feelings associated with her recent  termination from her job. Allowed patient to explore the situations that happened prior to her termination, and overall impact that the job loss has had on her family. Patient reports that she has a new job, and that she feels the situation was "meant to be" because she currently enjoys her position.  Explored thoughts and feelings associated with recent holidays, and patient reports that she had a good holiday-- only wishes that she had more time to spend with her daughter and grandchildren period.  explored behaviors that patient is working towards managing (lying behaviors). explored circumstances, in specific situations that patient is lying about, and determined whether patient is trying to get out of something, or trying to gain something by lying. Encouraged patient to be self aware and to write down when she sees that she is lying. Discussed impulse control and recommended several ways of managing impulsivity.  Patient concerned about weight loss and diabetes management--patient states or primary care physician referred her to a nutritionist. encouraged patient to reach out to primary care physician to see if she needs another referral, since she has not heard back from the nutritionist. Discussed overall health related concerns, and patient making healthy choices.   Continued recommendations are as follows: self care behaviors, positive social engagements, focusing on overall work/home/life balance, and focusing on positive physical and emotional wellness.  .   Suicidal/Homicidal: No  Therapist Response: Pt is continuing to apply interventions learned in session into daily life situations. Pt is currently on track to meet goals utilizing interventions mentioned above. Personal growth and progress noted. Treatment to continue as indicated.   Plan: Return again in 4 weeks.  Diagnosis:  Encounter Diagnoses  Name Primary?  MDD (major depressive disorder), recurrent episode, moderate ([place])  Yes   GAD (generalized anxiety disorder)    Collaboration of Care: Other pt encouraged to continue care with psychiatrist of record, Dr. Ursula Alert  Patient/Guardian was advised Release of Information must be obtained prior to any record release in order to collaborate their care with an outside provider. Patient/Guardian was advised if they have not already done so to contact the registration department to sign all necessary forms in order for [place] to release information regarding their care.   Consent: Patient/Guardian gives verbal consent for treatment and assignment of benefits for services provided during this visit. Patient/Guardian expressed understanding and agreed to proceed.   [place], LCSW 10/17/2022

## 2022-11-14 ENCOUNTER — Ambulatory Visit (HOSPITAL_COMMUNITY): Payer: No Typology Code available for payment source | Admitting: Licensed Clinical Social Worker

## 2022-11-24 ENCOUNTER — Encounter: Payer: Self-pay | Admitting: Psychiatry

## 2022-11-24 ENCOUNTER — Telehealth (INDEPENDENT_AMBULATORY_CARE_PROVIDER_SITE_OTHER): Payer: Self-pay | Admitting: Psychiatry

## 2022-11-24 DIAGNOSIS — F4010 Social phobia, unspecified: Secondary | ICD-10-CM

## 2022-11-24 DIAGNOSIS — F331 Major depressive disorder, recurrent, moderate: Secondary | ICD-10-CM

## 2022-11-24 DIAGNOSIS — Z634 Disappearance and death of family member: Secondary | ICD-10-CM

## 2022-11-24 DIAGNOSIS — Z79899 Other long term (current) drug therapy: Secondary | ICD-10-CM

## 2022-11-24 DIAGNOSIS — F3342 Major depressive disorder, recurrent, in full remission: Secondary | ICD-10-CM

## 2022-11-24 DIAGNOSIS — F411 Generalized anxiety disorder: Secondary | ICD-10-CM

## 2022-11-24 MED ORDER — REXULTI 0.5 MG PO TABS: 0.5000 mg | ORAL_TABLET | Freq: Every day | ORAL | 2 refills | Status: DC

## 2022-11-24 NOTE — Progress Notes (Signed)
Virtual Visit via Video Note  I connected with Maria Hancock on 11/24/22 at  4:20 PM EST by a video enabled telemedicine application and verified that I am speaking with the correct person using two identifiers. Location Provider Location : [facility] Patient Location : Work  Participants: Patient , Provider    I discussed the limitations of evaluation and management by telemedicine and the availability of in person appointments. The patient expressed understanding and agreed to proceed.  I discussed the assessment and treatment plan with the patient. The patient was provided an opportunity to ask questions and all were answered. The patient agreed with the plan and demonstrated an understanding of the instructions.   The patient was advised to call back or seek an in-person evaluation if the symptoms worsen or if the condition fails to improve as anticipated.   [address] MD OP Progress Note  11/25/2022 7:57 AM Maria Hancock  MRN:  [id number]  Chief Complaint:  Chief Complaint  Patient presents with   Follow-up   Medication Refill   Anxiety   Depression   HPI: Maria Hancock is a 56 year old Caucasian female, married, employed, lives in [place] has a history of MDD, GAD, social anxiety, bereavement, diabetes mellitus, hyperlipidemia, hypertension, history of left knee arthroplasty, chronic back pain, arthralgia was evaluated by telemedicine today.  Patient today reports she is currently doing well.  Denies any significant mood swings.  Denies any sadness, low motivation.  Denies anxiety symptoms.  Social anxiety is more manageable.  Patient reports appetite is fair.  Patient reports sleep is overall okay although she reports it does get restless due to her overactive bladder.  Patient denies any suicidality, homicidality or perceptual disturbances.  Patient denies any other concerns today.  Visit Diagnosis:    ICD-10-CM   1. MDD (major depressive disorder), recurrent,  in full remission ([place])  F33.42 Prolactin    Hepatic function panel    BUN+Creat    Brexpiprazole (REXULTI) 0.5 MG TABS    2. GAD (generalized anxiety disorder)  F41.1 Prolactin    3. Social anxiety disorder  F40.10     4. Bereavement  Z63.4     5. High risk medication use  Z79.899 Prolactin    Lipid panel    Hemoglobin A1C    Platelet count    Sodium    Hepatic function panel    BUN+Creat      Past Psychiatric History: Reviewed psychiatric history from progress note on 07/13/2022.  Past Medical History:  Past Medical History:  Diagnosis Date   Anxiety    Arthritis    "knees, back, hands" (02/26/2013)   Depression    Diabetes mellitus without complication (HCC)    GERD (gastroesophageal reflux disease)    OTC med as needed   Hyperlipidemia    Hypertension    Hypothyroidism    IBS (irritable bowel syndrome)    Menorrhagia    Migraine headache    Psoriasis     Past Surgical History:  Procedure Laterality Date   CARPAL TUNNEL RELEASE Right 12/22/2021   Procedure: RIGHT CARPAL TUNNEL RELEASE;  Surgeon: Sherilyn Cooter, MD;  Location: [facility];  Service: Orthopedics;  Laterality: Right;   HARDWARE REMOVAL Left 02/26/2013   Procedure: HARDWARE REMOVAL;  Surgeon: Meredith Pel, MD;  Location: [facility];  Service: Orthopedics;  Laterality: Left;  Left Knee Removal of Hardware   JOINT REPLACEMENT     KNEE ARTHROPLASTY Left ?2001   "put hardware in" (02/26/2013)  KNEE ARTHROSCOPY Left    "I've had 2; 2nd one was 2013" (02/26/2013)   REPLACEMENT TOTAL KNEE Left 02/26/2013   "w/hardware removal" (02/26/2013)   TOTAL KNEE ARTHROPLASTY Left 02/26/2013   Procedure: TOTAL KNEE ARTHROPLASTY;  Surgeon: Meredith Pel, MD;  Location: [facility];  Service: Orthopedics;  Laterality: Left;  Left Total Knee Arthroplasty    TUBAL LIGATION  1988    Family Psychiatric History: Reviewed family psychiatric history from progress note on 07/13/2022.  Family History:   Family History  Problem Relation Age of Onset   Cirrhosis Father    Alcohol abuse Brother    Intellectual disability Maternal Aunt    Heart disease Maternal Grandmother    Intellectual disability Son    Drug abuse Son     Social History: Reviewed social history from progress note on 07/13/2022. Social History   Socioeconomic History   Marital status: Married    Spouse name: Not on file   Number of children: 3   Years of education: Not on file   Highest education level: High school graduate  Occupational History   Not on file  Tobacco Use   Smoking status: Former    Packs/day: 1.00    Years: 16.00    Total pack years: 16.00    Types: Cigarettes    Quit date: 10/04/1995    Years since quitting: 27.1   Smokeless tobacco: Never  Vaping Use   Vaping Use: Never used  Substance and Sexual Activity   Alcohol use: No   Drug use: Not Currently    Types: Marijuana   Sexual activity: Not Currently    Birth control/protection: Surgical    Comment: BTL  Other Topics Concern   Not on file  Social History Narrative   Not on file   Social Determinants of Health   Financial Resource Strain: Not on file  Food Insecurity: Not on file  Transportation Needs: Not on file  Physical Activity: Not on file  Stress: Not on file  Social Connections: Not on file    Allergies: No Known Allergies  Metabolic Disorder Labs: Lab Results  Component Value Date   HGBA1C 7.3 (H) 12/04/2015   MPG 163 12/04/2015   No results found for: "PROLACTIN" No results found for: "CHOL", "TRIG", "HDL", "CHOLHDL", "VLDL", "LDLCALC" Lab Results  Component Value Date   TSH 7.285 (H) 12/04/2015    Therapeutic Level Labs: No results found for: "LITHIUM" No results found for: "VALPROATE" No results found for: "CBMZ"  Current Medications: Current Outpatient Medications  Medication Sig Dispense Refill   amLODipine (NORVASC) 5 MG tablet Take 5 mg by mouth daily.     atorvastatin (LIPITOR) 40 MG  tablet Take 40 mg by mouth daily.     Brexpiprazole (REXULTI) 0.5 MG TABS Take 1 tablet (0.5 mg total) by mouth at bedtime. 30 tablet 2   desloratadine (CLARINEX) 5 MG tablet Take 1 tablet (5 mg total) by mouth daily. 30 tablet 2   desvenlafaxine (PRISTIQ) 100 MG 24 hr tablet Take 100 mg by mouth daily.     fluticasone (FLONASE) 50 MCG/ACT nasal spray Place 2 sprays into both nostrils daily. 18.2 mL 11   metFORMIN (GLUCOPHAGE) 1000 MG tablet Take 1,000 mg by mouth 2 (two) times daily.     Potassium 99 MG TABS Take by mouth.     pregabalin (LYRICA) 100 MG capsule Take 100 mg by mouth 3 (three) times daily.     propranolol (INDERAL) 10 MG tablet Take 1  tablet (10 mg total) by mouth 2 (two) times daily as needed. For severe anxiety only 60 tablet 1   tolterodine (DETROL) 2 MG tablet Take 2 mg by mouth 2 (two) times daily.     TRADJENTA 5 MG TABS tablet Take 5 mg by mouth daily.     No current facility-administered medications for this visit.     Musculoskeletal: Strength & Muscle Tone:  UTA Gait & Station:  Seated Patient leans: N/A  Psychiatric Specialty Exam: Review of Systems  Genitourinary:        Overactive bladder-chronic  Psychiatric/Behavioral:  Positive for sleep disturbance.   All other systems reviewed and are negative.   There were no vitals taken for this visit.There is no height or weight on file to calculate BMI.  General Appearance: Casual  Eye Contact:  Fair  Speech:  Clear and Coherent  Volume:  Normal  Mood:  Euthymic  Affect:  Congruent  Thought Process:  Goal Directed and Descriptions of Associations: Intact  Orientation:  Full (Time, Place, and Person)  Thought Content: Logical   Suicidal Thoughts:  No  Homicidal Thoughts:  No  Memory:  Immediate;   Fair Recent;   Fair Remote;   Fair  Judgement:  Fair  Insight:  Fair  Psychomotor Activity:  Normal  Concentration:  Concentration: Fair and Attention Span: Fair  Recall:  [employer] of Knowledge: Fair   Language: Fair  Akathisia:  No  Handed:  Right  AIMS (if indicated): not done  Assets:  Communication Skills Desire for Improvement Housing Social Support  ADL's:  Intact  Cognition: WNL  Sleep:   restless due to need to urinate   Screenings: AIMS    Flowsheet Row Video Visit from 09/27/2022 in [facility] Office Visit from 07/13/2022 in [facility] Admission (Discharged) from 12/01/2015 in [facility]  AIMS Total Score 0 0 0      AUDIT    Flowsheet Row Admission (Discharged) from 12/01/2015 in [facility]  Alcohol Use Disorder Identification Test Final Score (AUDIT) 0      GAD-7    Flowsheet Row Counselor from 08/31/2022 in [place] at [facility]  Total GAD-7 Score 8      [place] from 08/31/2022 in [place] at [facility] Video Visit from 08/16/2022 in [facility] Office Visit from 07/13/2022 in [facility]  PHQ-2 Total Score '3 4 4  '$ PHQ-9 Total Score '17 11 15      '$ Flowsheet Row Video Visit from 11/24/2022 in [facility] Video Visit from 09/27/2022 in [facility] Counselor from 08/31/2022 in [facility] at [facility] No Risk No Risk No Risk        Assessment and Plan: Maria Hancock is a 57 year old Caucasian female, married, employed, has a history of depression, anxiety, multiple medical problems was evaluated by telemedicine today.  Patient is currently stable.  Plan as noted below.  Plan MDD in remission Rexulti 0.5 mg p.o. nightly Pristiq 100 mg p.o. daily Continue CBT with Ms. Christina  Hussami  GAD-stable Continue CBT Pristiq 100 mg p.o. daily  Social anxiety disorder-improving Propranolol 10 mg p.o. twice daily as needed for anxiety attacks Continue CBT  Bereavement-improving Continue CBT  High risk medication use - will order prolactin level, lipid panel, hemoglobin A1c, platelet count, sodium, hepatic function panel, BUN and creatinine.  Follow-up in clinic in 3 months or sooner if needed.   Consent: Patient/Guardian gives verbal consent for treatment and assignment of benefits for services provided during this visit. Patient/Guardian expressed understanding and agreed to proceed.   This note was generated in part or whole with voice recognition software. Voice recognition is usually quite accurate but there are transcription errors that can and very often do occur. I apologize for any typographical errors that were not detected and corrected.      Ursula Alert, MD 11/25/2022, 7:57 AM

## 2022-11-29 ENCOUNTER — Ambulatory Visit (INDEPENDENT_AMBULATORY_CARE_PROVIDER_SITE_OTHER): Payer: Medicaid Other | Admitting: Licensed Clinical Social Worker

## 2022-11-29 ENCOUNTER — Telehealth: Payer: Self-pay

## 2022-11-29 DIAGNOSIS — F411 Generalized anxiety disorder: Secondary | ICD-10-CM | POA: Diagnosis not present

## 2022-11-29 DIAGNOSIS — F3342 Major depressive disorder, recurrent, in full remission: Secondary | ICD-10-CM

## 2022-11-29 NOTE — Progress Notes (Signed)
Virtual Visit via Video Note  I connected with Maria Hancock on 11/29/22 at  8:00 AM EST by a video enabled telemedicine application and verified that I am speaking with the correct person using two identifiers.  Pt states that she is not feeling well at time of session.   Location: Patient: home Provider: remote office [place], [place])   I discussed the limitations of evaluation and management by telemedicine and the availability of in person appointments. The patient expressed understanding and agreed to proceed.  I discussed the assessment and treatment plan with the patient. The patient was provided an opportunity to ask questions and all were answered. The patient agreed with the plan and demonstrated an understanding of the instructions.   The patient was advised to call back or seek an in-person evaluation if the symptoms worsen or if the condition fails to improve as anticipated.  I provided 20 minutes of non-face-to-face time during this encounter.   [place], LCSW   THERAPIST PROGRESS NOTE  Session Time: 8-820a  Participation Level: Active  Behavioral Response: NeatAlertAnxious  Type of Therapy: Individual Therapy  Treatment Goals addressed: Develop healthy thinking patterns and beliefs about self, others, and the world that lead to the alleviation and help prevent the relapse of depression per self report 3 out of 5 sessions documented   Learn and implement coping skills that result in a reduction of anxiety and worry, and improve daily functioning per pt report 3 out of 5 sessions documented   ProgressTowards Goals: Progressing  Interventions: CBT, Motivational Interviewing, Solution Focused, and Supportive  Summary: Maria Hancock is a 57 y.o. female who presents with improving symptoms related to depression diagnosis.  Patient reports that overall mood has been stable and that she is managing situational stressors well.  Patient states at  the beginning of the appointment that she is not feeling well, and patient is laying in the bed at the time of her appointment.  Patient states that there has been some colleagues absent recently due to illness, and that patient is not feeling well today.  Allowed pt to explore and express thoughts and feelings associated with recent life situations and external stressors.  ..  Patient feels that medication is managing and making a big difference in her overall mood regulation.  Patient states that her physician discussed prescribing Ozempic, but currently they are unable to get insurance prior authorization.  Patient reports that she is being intentional about making healthy eating choices.    Discussed lying behaviors--patient states that she feels her self-awareness has improved and she has noticed a decrease in arbitrary lying recently.  Encouraged patient to continue with overall self-awareness, and thought stopping in the moment when she feels the urge to impulsively lie.  Continued recommendations are as follows: self care behaviors, positive social engagements, focusing on overall work/home/life balance, and focusing on positive physical and emotional wellness.  .   Suicidal/Homicidal: No  Therapist Response: Pt is continuing to apply interventions learned in session into daily life situations. Pt is currently on track to meet goals utilizing interventions mentioned above. Personal growth and progress noted. Treatment to continue as indicated.   Actively listened and provided continued supports focusing on management of depression, anxiety, and stress.  Used motivational interviewing to review eating behaviors, and nutritional choices.  Plan: Return again in 4 weeks.  Diagnosis:  Encounter Diagnoses  Name Primary?   MDD (major depressive disorder), recurrent, in full remission ([place]) Yes   GAD (  generalized anxiety disorder)     Collaboration of Care: Other pt encouraged to  continue care with psychiatrist of record, Dr. Ursula Alert  Patient/Guardian was advised Release of Information must be obtained prior to any record release in order to collaborate their care with an outside provider. Patient/Guardian was advised if they have not already done so to contact the registration department to sign all necessary forms in order for [place] to release information regarding their care.   Consent: Patient/Guardian gives verbal consent for treatment and assignment of benefits for services provided during this visit. Patient/Guardian expressed understanding and agreed to proceed.   [place], LCSW 11/29/2022

## 2022-11-29 NOTE — Telephone Encounter (Signed)
PA initiated for Rexulti 0.5 mg tablets via CoverMyMeds Approved [id number]  Coverage 11/28/22---11/29/23 Patient and pharmacy made aware

## 2022-11-30 ENCOUNTER — Telehealth: Payer: Medicaid Other | Admitting: Family Medicine

## 2022-11-30 DIAGNOSIS — J069 Acute upper respiratory infection, unspecified: Secondary | ICD-10-CM

## 2022-11-30 MED ORDER — AZELASTINE HCL 0.1 % NA SOLN: 2.0000 | Freq: Two times a day (BID) | NASAL | 0 refills | Status: DC

## 2022-11-30 MED ORDER — BENZONATATE 100 MG PO CAPS: 100.0000 mg | ORAL_CAPSULE | Freq: Three times a day (TID) | ORAL | 0 refills | Status: DC | PRN

## 2022-11-30 NOTE — Progress Notes (Signed)
Virtual Visit Consent   Maria Hancock, you are scheduled for a virtual visit with a [facility] provider today. Just as with appointments in the office, your consent must be obtained to participate. Your consent will be active for this visit and any virtual visit you may have with one of our providers in the next 365 days. If you have a MyChart account, a copy of this consent can be sent to you electronically.  As this is a virtual visit, video technology does not allow for your provider to perform a traditional examination. This may limit your provider's ability to fully assess your condition. If your provider identifies any concerns that need to be evaluated in person or the need to arrange testing (such as labs, EKG, etc.), we will make arrangements to do so. Although advances in technology are sophisticated, we cannot ensure that it will always work on either your end or our end. If the connection with a video visit is poor, the visit may have to be switched to a telephone visit. With either a video or telephone visit, we are not always able to ensure that we have a secure connection.  By engaging in this virtual visit, you consent to the provision of healthcare and authorize for your insurance to be billed (if applicable) for the services provided during this visit. Depending on your insurance coverage, you may receive a charge related to this service.  I need to obtain your verbal consent now. Are you willing to proceed with your visit today? Maria Hancock has provided verbal consent on 11/30/2022 for a virtual visit (video or telephone). Perlie Mayo, NP  Date: 11/30/2022 1:47 PM  Virtual Visit via Video Note   I, Perlie Mayo, connected with  Maria Hancock  ([id number], [birth year]) on 11/30/22 at  2:15 PM EST by a video-enabled telemedicine application and verified that I am speaking with the correct person using two identifiers.  Location: Patient: Virtual Visit Location  Patient: Home Provider: Virtual Visit Location Provider: Home Office   I discussed the limitations of evaluation and management by telemedicine and the availability of in person appointments. The patient expressed understanding and agreed to proceed.    History of Present Illness: Maria Hancock is a 57 y.o. who identifies as a female who was assigned female at birth, and is being seen today for sinus and cough  Onset was Sunday afternoon started with fever started up (unsure of temp didn't take- but had chills).  Associated symptoms are Monday developed aches, runny nose and congestion. Modifying factors are day quil and alka-seltzer plus Denies chest pain, shortness of breath, fevers, chills, headaches  Exposure to sick contacts- unknown COVID test: no Vaccines: no flu shot, covid vaccines- boosters  Problems:  Patient Active Problem List   Diagnosis Date Noted   High risk medication use 11/24/2022   MDD (major depressive disorder), recurrent episode, moderate ([place]) 07/13/2022   GAD (generalized anxiety disorder) 07/13/2022   Social anxiety disorder 07/13/2022   Bereavement 07/13/2022   At risk for prolonged QT interval syndrome 07/13/2022   Impingement syndrome of right shoulder 03/31/2022   Carpal tunnel syndrome, right upper limb 12/13/2021   Unspecified osteoarthritis, unspecified site 09/18/2021   Type 2 diabetes mellitus ([place]) 01/08/2021   Hypertensive disorder 01/08/2021   Neuropathy 01/08/2021   Atypical chest pain 05/31/2018   SOB (shortness of breath) 05/31/2018   Lightheadedness 05/31/2018   Palpitations 05/31/2018   Morbid obesity ([place]) 05/31/2018  Plantar fasciitis of left foot 01/26/2018   Major depression, recurrent, chronic ([place]) 12/02/2015   Osteoarthritis of left knee 02/26/2013    Class: Diagnosis of    Allergies: No Known Allergies Medications:  Current Outpatient Medications:    amLODipine (NORVASC) 5 MG tablet, Take 5 mg by mouth daily., Disp:  , Rfl:    atorvastatin (LIPITOR) 40 MG tablet, Take 40 mg by mouth daily., Disp: , Rfl:    Brexpiprazole (REXULTI) 0.5 MG TABS, Take 1 tablet (0.5 mg total) by mouth at bedtime., Disp: 30 tablet, Rfl: 2   desloratadine (CLARINEX) 5 MG tablet, Take 1 tablet (5 mg total) by mouth daily., Disp: 30 tablet, Rfl: 2   desvenlafaxine (PRISTIQ) 100 MG 24 hr tablet, Take 100 mg by mouth daily., Disp: , Rfl:    fluticasone (FLONASE) 50 MCG/ACT nasal spray, Place 2 sprays into both nostrils daily., Disp: 18.2 mL, Rfl: 11   metFORMIN (GLUCOPHAGE) 1000 MG tablet, Take 1,000 mg by mouth 2 (two) times daily., Disp: , Rfl:    Potassium 99 MG TABS, Take by mouth., Disp: , Rfl:    pregabalin (LYRICA) 100 MG capsule, Take 100 mg by mouth 3 (three) times daily., Disp: , Rfl:    propranolol (INDERAL) 10 MG tablet, Take 1 tablet (10 mg total) by mouth 2 (two) times daily as needed. For severe anxiety only, Disp: 60 tablet, Rfl: 1   tolterodine (DETROL) 2 MG tablet, Take 2 mg by mouth 2 (two) times daily., Disp: , Rfl:    TRADJENTA 5 MG TABS tablet, Take 5 mg by mouth daily., Disp: , Rfl:   Observations/Objective: Patient is well-developed, well-nourished in no acute distress.  Resting comfortably  at home.  Head is normocephalic, atraumatic.  No labored breathing.  Speech is clear and coherent with logical content.  Patient is alert and oriented at baseline.    Assessment and Plan:  1. Viral URI with cough  - azelastine (ASTELIN) 0.1 % nasal spray; Place 2 sprays into both nostrils 2 (two) times daily. Use in each nostril as directed  Dispense: 30 mL; Refill: 0 - benzonatate (TESSALON) 100 MG capsule; Take 1 capsule (100 mg total) by mouth 3 (three) times daily as needed for cough.  Dispense: 30 capsule; Refill: 0  -Take meds as prescribed -Rest -Use a cool mist humidifier especially during the winter months when heat dries out the air. - Use saline nose sprays frequently to help soothe nasal passages and  promote drainage. -Saline irrigations of the nose can be very helpful if done frequently.             * 4X daily for 1 week*             * Use of a nettie pot can be helpful with this.  *Follow directions with this* *Boiled or distilled water only -stay hydrated by drinking plenty of fluids - Keep thermostat turn down low to prevent drying out sinuses  - For fever or aches or pains- take tylenol or ibuprofen as directed on bottle             * for fevers greater than 101 orally you may alternate ibuprofen and tylenol every 3 hours.  If you do not improve you will need a follow up visit in person.                Reviewed side effects, risks and benefits of medication.    Patient acknowledged agreement and understanding of the plan.  Past Medical, Surgical, Social History, Allergies, and Medications have been Reviewed.    Follow Up Instructions: I discussed the assessment and treatment plan with the patient. The patient was provided an opportunity to ask questions and all were answered. The patient agreed with the plan and demonstrated an understanding of the instructions.  A copy of instructions were sent to the patient via MyChart unless otherwise noted below.    The patient was advised to call back or seek an in-person evaluation if the symptoms worsen or if the condition fails to improve as anticipated.  Time:  I spent 10 minutes with the patient via telehealth technology discussing the above problems/concerns.    Perlie Mayo, NP

## 2022-11-30 NOTE — Patient Instructions (Addendum)
Verta Ellen, thank you for joining Perlie Mayo, NP for today's virtual visit.  While this provider is not your primary care provider (PCP), if your PCP is located in our provider database this encounter information will be shared with them immediately following your visit.   [facility] account gives you access to today's visit and all your visits, tests, and labs performed at [facility] " click here if you don't have a [facility] account or go to [email or web address]  Consent: (Patient) Maria Hancock provided verbal consent for this virtual visit at the beginning of the encounter.  Current Medications:  Current Outpatient Medications:    azelastine (ASTELIN) 0.1 % nasal spray, Place 2 sprays into both nostrils 2 (two) times daily. Use in each nostril as directed, Disp: 30 mL, Rfl: 0   benzonatate (TESSALON) 100 MG capsule, Take 1 capsule (100 mg total) by mouth 3 (three) times daily as needed for cough., Disp: 30 capsule, Rfl: 0   amLODipine (NORVASC) 5 MG tablet, Take 5 mg by mouth daily., Disp: , Rfl:    atorvastatin (LIPITOR) 40 MG tablet, Take 40 mg by mouth daily., Disp: , Rfl:    Brexpiprazole (REXULTI) 0.5 MG TABS, Take 1 tablet (0.5 mg total) by mouth at bedtime., Disp: 30 tablet, Rfl: 2   desloratadine (CLARINEX) 5 MG tablet, Take 1 tablet (5 mg total) by mouth daily., Disp: 30 tablet, Rfl: 2   desvenlafaxine (PRISTIQ) 100 MG 24 hr tablet, Take 100 mg by mouth daily., Disp: , Rfl:    fluticasone (FLONASE) 50 MCG/ACT nasal spray, Place 2 sprays into both nostrils daily., Disp: 18.2 mL, Rfl: 11   metFORMIN (GLUCOPHAGE) 1000 MG tablet, Take 1,000 mg by mouth 2 (two) times daily., Disp: , Rfl:    Potassium 99 MG TABS, Take by mouth., Disp: , Rfl:    pregabalin (LYRICA) 100 MG capsule, Take 100 mg by mouth 3 (three) times daily., Disp: , Rfl:    propranolol (INDERAL) 10 MG tablet, Take 1 tablet (10 mg total) by mouth 2 (two) times daily  as needed. For severe anxiety only, Disp: 60 tablet, Rfl: 1   tolterodine (DETROL) 2 MG tablet, Take 2 mg by mouth 2 (two) times daily., Disp: , Rfl:    TRADJENTA 5 MG TABS tablet, Take 5 mg by mouth daily., Disp: , Rfl:    Medications ordered in this encounter:  Meds ordered this encounter  Medications   azelastine (ASTELIN) 0.1 % nasal spray    Sig: Place 2 sprays into both nostrils 2 (two) times daily. Use in each nostril as directed    Dispense:  30 mL    Refill:  0    Order Specific Question:   Supervising Provider    Answer:   Chase Picket [id number]   benzonatate (TESSALON) 100 MG capsule    Sig: Take 1 capsule (100 mg total) by mouth 3 (three) times daily as needed for cough.    Dispense:  30 capsule    Refill:  0    Order Specific Question:   Supervising Provider    Answer:   Chase Picket [id number]     *If you need refills on other medications prior to your next appointment, please contact your pharmacy*  Follow-Up: Call back or seek an in-person evaluation if the symptoms worsen or if the condition fails to improve as anticipated.  [facility] [phone]  Other Instructions  -Take meds as prescribed -  Rest -Use a cool mist humidifier especially during the winter months when heat dries out the air. - Use saline nose sprays frequently to help soothe nasal passages and promote drainage. -Saline irrigations of the nose can be very helpful if done frequently.             * 4X daily for 1 week*             * Use of a nettie pot can be helpful with this.  *Follow directions with this* *Boiled or distilled water only -stay hydrated by drinking plenty of fluids - Keep thermostat turn down low to prevent drying out sinuses  - For fever or aches or pains- take tylenol or ibuprofen as directed on bottle             * for fevers greater than 101 orally you may alternate ibuprofen and tylenol every 3 hours.  If you do not improve you will need a  follow up visit in person.                  If you have been instructed to have an in-person evaluation today at a local Urgent Care facility, please use the link below. It will take you to a list of all of our available [facility], including address, phone number and hours of operation. Please do not delay care.  [facility]  If you or a family member do not have a primary care provider, use the link below to schedule a visit and establish care. When you choose a [facility] primary care physician or advanced practice provider, you gain a long-term partner in health. Find a Primary Care Provider  Learn more about [facility] in-office and virtual care options: [facility]

## 2022-12-05 ENCOUNTER — Ambulatory Visit
Admission: EM | Admit: 2022-12-05 | Discharge: 2022-12-05 | Disposition: A | Discharge status: Home / Self Care | Payer: Medicaid Other | Attending: Internal Medicine | Admitting: Internal Medicine

## 2022-12-05 DIAGNOSIS — R7981 Abnormal blood-gas level: Secondary | ICD-10-CM

## 2022-12-05 DIAGNOSIS — R051 Acute cough: Secondary | ICD-10-CM

## 2022-12-05 DIAGNOSIS — J069 Acute upper respiratory infection, unspecified: Secondary | ICD-10-CM

## 2022-12-05 MED ORDER — AMOXICILLIN-POT CLAVULANATE 875-125 MG PO TABS: 1.0000 | ORAL_TABLET | Freq: Two times a day (BID) | ORAL | 0 refills | Status: DC

## 2022-12-05 MED ORDER — ALBUTEROL SULFATE HFA 108 (90 BASE) MCG/ACT IN AERS: 1.0000 | INHALATION_SPRAY | Freq: Four times a day (QID) | RESPIRATORY_TRACT | 0 refills | Status: DC | PRN

## 2022-12-05 MED ORDER — ALBUTEROL SULFATE (2.5 MG/3ML) 0.083% IN NEBU
2.5000 mg | INHALATION_SOLUTION | Freq: Once | RESPIRATORY_TRACT | Status: AC
  Administered 2022-12-05: 2.5 mg via RESPIRATORY_TRACT

## 2022-12-05 MED ORDER — PREDNISONE 20 MG PO TABS: 40.0000 mg | ORAL_TABLET | Freq: Every day | ORAL | 0 refills | Status: AC

## 2022-12-05 MED ORDER — METHYLPREDNISOLONE ACETATE 80 MG/ML IJ SUSP
80.0000 mg | Freq: Once | INTRAMUSCULAR | Status: AC
  Administered 2022-12-05: 80 mg via INTRAMUSCULAR

## 2022-12-05 NOTE — ED Triage Notes (Signed)
Pt presents with c/o SOB, cough, congestion, and fatigue x 8 days

## 2022-12-05 NOTE — ED Provider Notes (Addendum)
[name]    CSN: [id number] Arrival date & time: 12/05/22  1633      History   Chief Complaint Chief Complaint  Patient presents with   Cough   Nasal Congestion   Fatigue    HPI MINOLA PIWOWARCZYK is a 57 y.o. female.   Patient presents with cough, nasal congestion, fatigue that has been present for about 8 days.  Cough is dry per patient.  She denies any documented fevers but does report that she felt feverish.  Denies history of asthma or COPD.  Reports intermittent shortness of breath but denies chest pain.  Denies gastrointestinal symptoms.  Patient does not smoke cigarettes.  Patient had a visit with PCP and prescribed cough medication and nasal spray which she has been taking with no improvement.   Cough   Past Medical History:  Diagnosis Date   Anxiety    Arthritis    "knees, back, hands" (02/26/2013)   Depression    Diabetes mellitus without complication (HCC)    GERD (gastroesophageal reflux disease)    OTC med as needed   Hyperlipidemia    Hypertension    Hypothyroidism    IBS (irritable bowel syndrome)    Menorrhagia    Migraine headache    Psoriasis     Patient Active Problem List   Diagnosis Date Noted   High risk medication use 11/24/2022   MDD (major depressive disorder), recurrent episode, moderate ([place]) 07/13/2022   GAD (generalized anxiety disorder) 07/13/2022   Social anxiety disorder 07/13/2022   Bereavement 07/13/2022   At risk for prolonged QT interval syndrome 07/13/2022   Impingement syndrome of right shoulder 03/31/2022   Carpal tunnel syndrome, right upper limb 12/13/2021   Unspecified osteoarthritis, unspecified site 09/18/2021   Type 2 diabetes mellitus ([place]) 01/08/2021   Hypertensive disorder 01/08/2021   Neuropathy 01/08/2021   Atypical chest pain 05/31/2018   SOB (shortness of breath) 05/31/2018   Lightheadedness 05/31/2018   Palpitations 05/31/2018   Morbid obesity ([place]) 05/31/2018   Plantar fasciitis of  left foot 01/26/2018   Major depression, recurrent, chronic ([place]) 12/02/2015   Osteoarthritis of left knee 02/26/2013    Class: Diagnosis of    Past Surgical History:  Procedure Laterality Date   CARPAL TUNNEL RELEASE Right 12/22/2021   Procedure: RIGHT CARPAL TUNNEL RELEASE;  Surgeon: Sherilyn Cooter, MD;  Location: [facility];  Service: Orthopedics;  Laterality: Right;   HARDWARE REMOVAL Left 02/26/2013   Procedure: HARDWARE REMOVAL;  Surgeon: Meredith Pel, MD;  Location: [facility];  Service: Orthopedics;  Laterality: Left;  Left Knee Removal of Hardware   JOINT REPLACEMENT     KNEE ARTHROPLASTY Left ?2001   "put hardware in" (02/26/2013)   KNEE ARTHROSCOPY Left    "I've had 2; 2nd one was 2013" (02/26/2013)   REPLACEMENT TOTAL KNEE Left 02/26/2013   "w/hardware removal" (02/26/2013)   TOTAL KNEE ARTHROPLASTY Left 02/26/2013   Procedure: TOTAL KNEE ARTHROPLASTY;  Surgeon: Meredith Pel, MD;  Location: [facility];  Service: Orthopedics;  Laterality: Left;  Left Total Knee Arthroplasty    TUBAL LIGATION  1988    OB History   No obstetric history on file.      Home Medications    Prior to Admission medications   Medication Sig Start Date End Date Taking? Authorizing Provider  albuterol (VENTOLIN HFA) 108 (90 Base) MCG/ACT inhaler Inhale 1-2 puffs into the lungs every 6 (six) hours as needed for wheezing or shortness of breath.  12/05/22  Yes , Hildred Alamin E, FNP  amoxicillin-clavulanate (AUGMENTIN) 875-125 MG tablet Take 1 tablet by mouth every 12 (twelve) hours. 12/05/22  Yes , Hildred Alamin E, FNP  predniSONE (DELTASONE) 20 MG tablet Take 2 tablets (40 mg total) by mouth daily for 5 days. 12/05/22 12/10/22 Yes , Hildred Alamin E, FNP  amLODipine (NORVASC) 5 MG tablet Take 5 mg by mouth daily.    [provider]  atorvastatin (LIPITOR) 40 MG tablet Take 40 mg by mouth daily.    [provider]  azelastine (ASTELIN) 0.1 % nasal spray Place 2 sprays into both  nostrils 2 (two) times daily. Use in each nostril as directed 11/30/22   Perlie Mayo, NP  benzonatate (TESSALON) 100 MG capsule Take 1 capsule (100 mg total) by mouth 3 (three) times daily as needed for cough. 11/30/22   Perlie Mayo, NP  Brexpiprazole (REXULTI) 0.5 MG TABS Take 1 tablet (0.5 mg total) by mouth at bedtime. 11/24/22   Ursula Alert, MD  desloratadine (CLARINEX) 5 MG tablet Take 1 tablet (5 mg total) by mouth daily. 05/27/22 05/27/23  Armando Reichert, MD  desvenlafaxine (PRISTIQ) 100 MG 24 hr tablet Take 100 mg by mouth daily.    [provider]  fluticasone (FLONASE) 50 MCG/ACT nasal spray Place 2 sprays into both nostrils daily. 05/27/22 05/27/23  Armando Reichert, MD  metFORMIN (GLUCOPHAGE) 1000 MG tablet Take 1,000 mg by mouth 2 (two) times daily. 06/07/22   [provider]  Potassium 99 MG TABS Take by mouth.    [provider]  pregabalin (LYRICA) 100 MG capsule Take 100 mg by mouth 3 (three) times daily. 06/07/22   [provider]  propranolol (INDERAL) 10 MG tablet Take 1 tablet (10 mg total) by mouth 2 (two) times daily as needed. For severe anxiety only 07/13/22   Ursula Alert, MD  tolterodine (DETROL) 2 MG tablet Take 2 mg by mouth 2 (two) times daily.    [provider]  TRADJENTA 5 MG TABS tablet Take 5 mg by mouth daily. 06/22/22   [provider]  FLUoxetine (PROZAC) 20 MG tablet TAKE 1 AND ONE-HALF TABLETS ('30MG'$  TOTAL) BY MOUTH DAILY. 05/07/18 04/14/20  [provider]    Family History Family History  Problem Relation Age of Onset   Cirrhosis Father    Alcohol abuse Brother    Intellectual disability Maternal Aunt    Heart disease Maternal Grandmother    Intellectual disability Son    Drug abuse Son     Social History Social History   Tobacco Use   Smoking status: Former    Packs/day: 1.00    Years: 16.00    Total pack years: 16.00    Types: Cigarettes    Quit date: 10/04/1995    Years since  quitting: 27.1   Smokeless tobacco: Never  Vaping Use   Vaping Use: Never used  Substance Use Topics   Alcohol use: No   Drug use: Not Currently    Types: Marijuana     Allergies   Patient has no known allergies.   Review of Systems Review of Systems Per HPI  Physical Exam Triage Vital Signs ED Triage Vitals  Enc Vitals Group     BP 12/05/22 1759 (!) 140/97     Pulse Rate 12/05/22 1759 (!) 106     Resp 12/05/22 1759 20     Temp 12/05/22 1759 98.2 F (36.8 C)     Temp Source 12/05/22 1759 Oral  SpO2 12/05/22 1759 91 %     Weight --      Height --      Head Circumference --      Peak Flow --      Pain Score 12/05/22 1758 4     Pain Loc --      Pain Edu? --      Excl. in [place]? --    No data found.  Updated Vital Signs BP (!) 140/97 (BP Location: Left Arm)   Pulse (!) 106   Temp 98.2 F (36.8 C) (Oral)   Resp 20   SpO2 91%   Visual Acuity Right Eye Distance:   Left Eye Distance:   Bilateral Distance:    Right Eye Near:   Left Eye Near:    Bilateral Near:     Physical Exam Constitutional:      General: She is not in acute distress.    Appearance: Normal appearance. She is not toxic-appearing or diaphoretic.  HENT:     Head: Normocephalic and atraumatic.     Right Ear: Tympanic membrane and ear canal normal.     Left Ear: Tympanic membrane and ear canal normal.     Nose: Congestion present.     Mouth/Throat:     Mouth: Mucous membranes are moist.     Pharynx: No posterior oropharyngeal erythema.  Eyes:     Extraocular Movements: Extraocular movements intact.     Conjunctiva/sclera: Conjunctivae normal.     Pupils: Pupils are equal, round, and reactive to light.  Cardiovascular:     Rate and Rhythm: Normal rate and regular rhythm.     Pulses: Normal pulses.     Heart sounds: Normal heart sounds.  Pulmonary:     Effort: Pulmonary effort is normal. No respiratory distress.     Breath sounds: Normal breath sounds. No stridor. No wheezing, rhonchi  or rales.  Abdominal:     General: Abdomen is flat. Bowel sounds are normal.     Palpations: Abdomen is soft.  Musculoskeletal:        General: Normal range of motion.     Cervical back: Normal range of motion.  Skin:    General: Skin is warm and dry.  Neurological:     General: No focal deficit present.     Mental Status: She is alert and oriented to person, place, and time. Mental status is at baseline.  Psychiatric:        Mood and Affect: Mood normal.        Behavior: Behavior normal.      UC Treatments / Results  Labs (all labs ordered are listed, but only abnormal results are displayed) Labs Reviewed  BASIC METABOLIC PANEL  CBC    EKG   Radiology No results found.  Procedures Procedures (including critical care time)  Medications Ordered in UC Medications  albuterol (PROVENTIL) (2.5 MG/3ML) 0.083% nebulizer solution 2.5 mg (2.5 mg Nebulization Given 12/05/22 1812)  methylPREDNISolone acetate (DEPO-MEDROL) injection 80 mg (80 mg Intramuscular Given 12/05/22 1859)    Initial Impression / Assessment and Plan / UC Course  I have reviewed the triage vital signs and the nursing notes.  Pertinent labs & imaging results that were available during my care of the patient were reviewed by me and considered in my medical decision making (see chart for details).     Patient's oxygen saturation was 91% during initial triage.  Albuterol nebulizer treatment administered in urgent care with no improvement in oxygen saturation.  Oxygen saturations ranging from 86 to 91% during physical exam and during urgent care stay.  IM Depo-Medrol administered as well with no improvement.  Patient was advised to go to the ER for further evaluation and management.  Patient adamantly refused to go to the ER.  Risks associated with not going to ER were discussed with patient.  Patient voiced understanding and accepted risks.  Will do limited management here in urgent care given patient is declining  going to the ER.  Patient most likely has persistent viral illness. Although, Will prescribe Augmentin to cover for secondary bacterial infection and prednisone to decrease inflammation. Patient has taken steroids before and tolerated well. Advised patient to monitor glucose closely while taking steroid. Benefits outweigh risks of steroid therapy at this time.   Patient advised to start taking prednisone tomorrow given that she received an IM steroid shot today here in urgent care.  Albuterol inhaler also prescribed for patient to take consistently and as needed for shortness of breath.  Patient has pulse oximeter at home so she was encouraged to monitor this very diligently and go to the ER if symptoms persist or worsen.  Suggested chest x-ray but do not have x-ray tech here in urgent care today so outpatient x-ray was recommended.  Patient adamantly declined outpatient x-ray today as well.  Risks associated with not doing x-ray were discussed with patient.  Patient verbalized understanding.  Advised patient to come back to urgent care tomorrow to have vital signs rechecked.  Will obtain BMP and CBC as well.  Patient verbalized understanding. Viral testing deferred given duration of symptoms as it would not change treatment.  Final Clinical Impressions(s) / UC Diagnoses   Final diagnoses:  Acute upper respiratory infection  Acute cough  Low oxygen saturation     Discharge Instructions      You were given a steroid injection today in urgent care.  You have also been prescribed prednisone.  Start taking this tomorrow.  I have prescribed you an albuterol inhaler.  Please take this as prescribed consistently.  Monitor oxygen at home with your pulse oximeter.  Go to ER if any symptoms persist or worsen.  Blood work is pending.  Will call if it is abnormal.     ED Prescriptions     Medication Sig Dispense Auth. Provider   albuterol (VENTOLIN HFA) 108 (90 Base) MCG/ACT inhaler Inhale 1-2 puffs into  the lungs every 6 (six) hours as needed for wheezing or shortness of breath. 1 each [place], Hildred Alamin E, [place]   predniSONE (DELTASONE) 20 MG tablet Take 2 tablets (40 mg total) by mouth daily for 5 days. 10 tablet [place], Hildred Alamin E, [place]   amoxicillin-clavulanate (AUGMENTIN) 875-125 MG tablet Take 1 tablet by mouth every 12 (twelve) hours. 14 tablet [place], Michele Rockers, [place]      PDMP not reviewed this encounter.   Teodora Medici, [place] 12/05/22 [place], [place],  12/05/22 2230

## 2022-12-05 NOTE — Discharge Instructions (Signed)
You were given a steroid injection today in urgent care.  You have also been prescribed prednisone.  Start taking this tomorrow.  I have prescribed you an albuterol inhaler.  Please take this as prescribed consistently.  Monitor oxygen at home with your pulse oximeter.  Go to ER if any symptoms persist or worsen.  Blood work is pending.  Will call if it is abnormal.

## 2022-12-07 ENCOUNTER — Telehealth: Payer: Self-pay

## 2022-12-07 DIAGNOSIS — R051 Acute cough: Secondary | ICD-10-CM | POA: Insufficient documentation

## 2022-12-07 LAB — BASIC METABOLIC PANEL
BUN/Creatinine Ratio: 26 — ABNORMAL HIGH (ref 9–23)
BUN: 23 mg/dL (ref 6–24)
CO2: 22 mmol/L (ref 20–29)
Calcium: 9 mg/dL (ref 8.7–10.2)
Chloride: 97 mmol/L (ref 96–106)
Creatinine, Ser: 0.87 mg/dL (ref 0.57–1.00)
Glucose: 293 mg/dL — ABNORMAL HIGH (ref 70–99)
Potassium: 3.7 mmol/L (ref 3.5–5.2)
Sodium: 139 mmol/L (ref 134–144)
eGFR: 79 mL/min/{1.73_m2} (ref >59)

## 2022-12-07 LAB — CBC
Hematocrit: 46.4 % (ref 34.0–46.6)
Hemoglobin: 14.4 g/dL (ref 11.1–15.9)
MCH: 31 pg (ref 26.6–33.0)
MCHC: 31 g/dL — ABNORMAL LOW (ref 31.5–35.7)
MCV: 100 fL — ABNORMAL HIGH (ref 79–97)
Platelets: 312 10*3/uL (ref 150–450)
RBC: 4.65 x10E6/uL (ref 3.77–5.28)
RDW: 14.1 % (ref 11.7–15.4)
WBC: 7.8 10*3/uL (ref 3.4–10.8)

## 2022-12-07 NOTE — Telephone Encounter (Signed)
prior Josem Kaufmann was done on the rexuilti .'5mg'$  medication was approved from 11-28-22 to 11-29-23 PA # [id number]

## 2022-12-08 ENCOUNTER — Other Ambulatory Visit: Payer: Self-pay | Admitting: Physician Assistant

## 2022-12-08 DIAGNOSIS — R051 Acute cough: Secondary | ICD-10-CM

## 2022-12-16 ENCOUNTER — Telehealth: Payer: Medicaid Other | Admitting: Physician Assistant

## 2022-12-16 DIAGNOSIS — R3989 Other symptoms and signs involving the genitourinary system: Secondary | ICD-10-CM | POA: Diagnosis not present

## 2022-12-16 MED ORDER — SULFAMETHOXAZOLE-TRIMETHOPRIM 800-160 MG PO TABS: 1.0000 | ORAL_TABLET | Freq: Two times a day (BID) | ORAL | 0 refills | Status: DC

## 2022-12-16 NOTE — Progress Notes (Signed)
Virtual Visit Consent   Maria Hancock, you are scheduled for a virtual visit with a [facility] provider today. Just as with appointments in the office, your consent must be obtained to participate. Your consent will be active for this visit and any virtual visit you may have with one of our providers in the next 365 days. If you have a MyChart account, a copy of this consent can be sent to you electronically.  As this is a virtual visit, video technology does not allow for your provider to perform a traditional examination. This may limit your provider's ability to fully assess your condition. If your provider identifies any concerns that need to be evaluated in person or the need to arrange testing (such as labs, EKG, etc.), we will make arrangements to do so. Although advances in technology are sophisticated, we cannot ensure that it will always work on either your end or our end. If the connection with a video visit is poor, the visit may have to be switched to a telephone visit. With either a video or telephone visit, we are not always able to ensure that we have a secure connection.  By engaging in this virtual visit, you consent to the provision of healthcare and authorize for your insurance to be billed (if applicable) for the services provided during this visit. Depending on your insurance coverage, you may receive a charge related to this service.  I need to obtain your verbal consent now. Are you willing to proceed with your visit today? Maria Hancock has provided verbal consent on 12/16/2022 for a virtual visit (video or telephone). Leeanne Rio, [place]  Date: 12/16/2022 4:50 PM  Virtual Visit via Video Note   I, Leeanne Rio, connected with  Maria Hancock  ([id number], [birth year]) on 12/16/22 at  4:45 PM EDT by a video-enabled telemedicine application and verified that I am speaking with the correct person using two identifiers.  Location: Patient: Virtual Visit  Location Patient: Home Provider: Virtual Visit Location Provider: Home Office   I discussed the limitations of evaluation and management by telemedicine and the availability of in person appointments. The patient expressed understanding and agreed to proceed.    History of Present Illness: Maria Hancock is a 57 y.o. who identifies as a female who was assigned female at birth, and is being seen today for possible UTI. Endorses 1 days of dysuria, urgency/frequency, hesitancy. Denies hematuria, fever, chills, back pain, nausea or vomiting.  HPI: HPI  Problems:  Patient Active Problem List   Diagnosis Date Noted   High risk medication use 11/24/2022   MDD (major depressive disorder), recurrent episode, moderate ([place]) 07/13/2022   GAD (generalized anxiety disorder) 07/13/2022   Social anxiety disorder 07/13/2022   Bereavement 07/13/2022   At risk for prolonged QT interval syndrome 07/13/2022   Impingement syndrome of right shoulder 03/31/2022   Carpal tunnel syndrome, right upper limb 12/13/2021   Unspecified osteoarthritis, unspecified site 09/18/2021   Type 2 diabetes mellitus ([place]) 01/08/2021   Hypertensive disorder 01/08/2021   Neuropathy 01/08/2021   Atypical chest pain 05/31/2018   SOB (shortness of breath) 05/31/2018   Lightheadedness 05/31/2018   Palpitations 05/31/2018   Morbid obesity ([place]) 05/31/2018   Plantar fasciitis of left foot 01/26/2018   Major depression, recurrent, chronic ([place]) 12/02/2015   Osteoarthritis of left knee 02/26/2013    Class: Diagnosis of    Allergies: No Known Allergies Medications:  Current Outpatient Medications:    sulfamethoxazole-trimethoprim (BACTRIM  DS) 800-160 MG tablet, Take 1 tablet by mouth 2 (two) times daily., Disp: 10 tablet, Rfl: 0   amLODipine (NORVASC) 5 MG tablet, Take 5 mg by mouth daily., Disp: , Rfl:    atorvastatin (LIPITOR) 40 MG tablet, Take 40 mg by mouth daily., Disp: , Rfl:    Brexpiprazole (REXULTI) 0.5 MG TABS,  Take 1 tablet (0.5 mg total) by mouth at bedtime., Disp: 30 tablet, Rfl: 2   desloratadine (CLARINEX) 5 MG tablet, Take 1 tablet (5 mg total) by mouth daily., Disp: 30 tablet, Rfl: 2   desvenlafaxine (PRISTIQ) 100 MG 24 hr tablet, Take 100 mg by mouth daily., Disp: , Rfl:    fluticasone (FLONASE) 50 MCG/ACT nasal spray, Place 2 sprays into both nostrils daily., Disp: 18.2 mL, Rfl: 11   Potassium 99 MG TABS, Take by mouth., Disp: , Rfl:    pregabalin (LYRICA) 100 MG capsule, Take 100 mg by mouth 3 (three) times daily., Disp: , Rfl:    propranolol (INDERAL) 10 MG tablet, Take 1 tablet (10 mg total) by mouth 2 (two) times daily as needed. For severe anxiety only, Disp: 60 tablet, Rfl: 1   tolterodine (DETROL) 2 MG tablet, Take 2 mg by mouth 2 (two) times daily., Disp: , Rfl:   Observations/Objective: Patient is well-developed, well-nourished in no acute distress.  Resting comfortably at home.  Head is normocephalic, atraumatic.  No labored breathing. Speech is clear and coherent with logical content.  Patient is alert and oriented at baseline.   Assessment and Plan: 1. Suspected UTI - sulfamethoxazole-trimethoprim (BACTRIM DS) 800-160 MG tablet; Take 1 tablet by mouth 2 (two) times daily.  Dispense: 10 tablet; Refill: 0  Classic UTI symptoms with absence of alarm signs or symptoms. Prior history of UTI. Will treat empirically with Bactrim for suspected uncomplicated cystitis. Supportive measures and OTC medications reviewed. Strict in-person evaluation precautions discussed.    Follow Up Instructions: I discussed the assessment and treatment plan with the patient. The patient was provided an opportunity to ask questions and all were answered. The patient agreed with the plan and demonstrated an understanding of the instructions.  A copy of instructions were sent to the patient via MyChart unless otherwise noted below.   The patient was advised to call back or seek an in-person evaluation if  the symptoms worsen or if the condition fails to improve as anticipated.  Time:  I spent 10 minutes with the patient via telehealth technology discussing the above problems/concerns.    Leeanne Rio, PA-C

## 2022-12-16 NOTE — Patient Instructions (Signed)
Verta Ellen, thank you for joining Leeanne Rio, PA-C for today's virtual visit.  While this provider is not your primary care provider (PCP), if your PCP is located in our provider database this encounter information will be shared with them immediately following your visit.   [facility] account gives you access to today's visit and all your visits, tests, and labs performed at [facility] " click here if you don't have a [facility] account or go to [email or web address]  Consent: (Patient) Maria Hancock provided verbal consent for this virtual visit at the beginning of the encounter.  Current Medications:  Current Outpatient Medications:    albuterol (VENTOLIN HFA) 108 (90 Base) MCG/ACT inhaler, Inhale 1-2 puffs into the lungs every 6 (six) hours as needed for wheezing or shortness of breath., Disp: 1 each, Rfl: 0   amLODipine (NORVASC) 5 MG tablet, Take 5 mg by mouth daily., Disp: , Rfl:    amoxicillin-clavulanate (AUGMENTIN) 875-125 MG tablet, Take 1 tablet by mouth every 12 (twelve) hours., Disp: 14 tablet, Rfl: 0   atorvastatin (LIPITOR) 40 MG tablet, Take 40 mg by mouth daily., Disp: , Rfl:    azelastine (ASTELIN) 0.1 % nasal spray, Place 2 sprays into both nostrils 2 (two) times daily. Use in each nostril as directed, Disp: 30 mL, Rfl: 0   benzonatate (TESSALON) 100 MG capsule, Take 1 capsule (100 mg total) by mouth 3 (three) times daily as needed for cough., Disp: 30 capsule, Rfl: 0   Brexpiprazole (REXULTI) 0.5 MG TABS, Take 1 tablet (0.5 mg total) by mouth at bedtime., Disp: 30 tablet, Rfl: 2   desloratadine (CLARINEX) 5 MG tablet, Take 1 tablet (5 mg total) by mouth daily., Disp: 30 tablet, Rfl: 2   desvenlafaxine (PRISTIQ) 100 MG 24 hr tablet, Take 100 mg by mouth daily., Disp: , Rfl:    fluticasone (FLONASE) 50 MCG/ACT nasal spray, Place 2 sprays into both nostrils daily., Disp: 18.2 mL, Rfl: 11   metFORMIN (GLUCOPHAGE) 1000  MG tablet, Take 1,000 mg by mouth 2 (two) times daily., Disp: , Rfl:    Potassium 99 MG TABS, Take by mouth., Disp: , Rfl:    pregabalin (LYRICA) 100 MG capsule, Take 100 mg by mouth 3 (three) times daily., Disp: , Rfl:    propranolol (INDERAL) 10 MG tablet, Take 1 tablet (10 mg total) by mouth 2 (two) times daily as needed. For severe anxiety only, Disp: 60 tablet, Rfl: 1   tolterodine (DETROL) 2 MG tablet, Take 2 mg by mouth 2 (two) times daily., Disp: , Rfl:    TRADJENTA 5 MG TABS tablet, Take 5 mg by mouth daily., Disp: , Rfl:    Medications ordered in this encounter:  No orders of the defined types were placed in this encounter.    *If you need refills on other medications prior to your next appointment, please contact your pharmacy*  Follow-Up: Call back or seek an in-person evaluation if the symptoms worsen or if the condition fails to improve as anticipated.  [facility] [phone]  Other Instructions Your symptoms are consistent with a bladder infection, also called acute cystitis. Please take your antibiotic (Bactrim) as directed until all pills are gone.  Stay very well hydrated.  Consider a daily probiotic (Align, Culturelle, or Activia) to help prevent stomach upset caused by the antibiotic.  Taking a probiotic daily may also help prevent recurrent UTIs.  Also consider taking AZO (Phenazopyridine) tablets to help decrease  pain with urination.   Call or return to clinic if symptoms are not resolved by completion of antibiotic.   Urinary Tract Infection A urinary tract infection (UTI) can occur any place along the urinary tract. The tract includes the kidneys, ureters, bladder, and urethra. A type of germ called bacteria often causes a UTI. UTIs are often helped with antibiotic medicine.  HOME CARE  If given, take antibiotics as told by your doctor. Finish them even if you start to feel better. Drink enough fluids to keep your pee (urine) clear or pale  yellow. Avoid tea, drinks with caffeine, and bubbly (carbonated) drinks. Pee often. Avoid holding your pee in for a long time. Pee before and after having sex (intercourse). Wipe from front to back after you poop (bowel movement) if you are a woman. Use each tissue only once. GET HELP RIGHT AWAY IF:  You have back pain. You have lower belly (abdominal) pain. You have chills. You feel sick to your stomach (nauseous). You throw up (vomit). Your burning or discomfort with peeing does not go away. You have a fever. Your symptoms are not better in 3 days. MAKE SURE YOU:  Understand these instructions. Will watch your condition. Will get help right away if you are not doing well or get worse. Document Released: 03/07/2008 Document Revised: 06/13/2012 Document Reviewed: 04/19/2012 [facility] Patient Information 2015 [place], [place]. This information is not intended to replace advice given to you by your health care provider. Make sure you discuss any questions you have with your health care provider.    If you have been instructed to have an in-person evaluation today at a local Urgent Care facility, please use the link below. It will take you to a list of all of our available [facility], including address, phone number and hours of operation. Please do not delay care.  [facility]  If you or a family member do not have a primary care provider, use the link below to schedule a visit and establish care. When you choose a [facility] primary care physician or advanced practice provider, you gain a long-term partner in health. Find a Primary Care Provider  Learn more about [facility]'s in-office and virtual care options: [facility]

## 2022-12-26 ENCOUNTER — Ambulatory Visit (INDEPENDENT_AMBULATORY_CARE_PROVIDER_SITE_OTHER): Payer: Medicaid Other | Admitting: Licensed Clinical Social Worker

## 2022-12-26 DIAGNOSIS — F3342 Major depressive disorder, recurrent, in full remission: Secondary | ICD-10-CM

## 2022-12-26 DIAGNOSIS — Z634 Disappearance and death of family member: Secondary | ICD-10-CM | POA: Diagnosis not present

## 2022-12-26 DIAGNOSIS — F411 Generalized anxiety disorder: Secondary | ICD-10-CM

## 2022-12-26 NOTE — Progress Notes (Signed)
Virtual Visit via Video Note  I connected with Maria Hancock on 12/26/22 at  4:00 PM EDT by a video enabled telemedicine application and verified that I am speaking with the correct person using two identifiers.  Pt states that she is not feeling well at time of session.   Location: Patient: home Provider: remote office [place], [place])   I discussed the limitations of evaluation and management by telemedicine and the availability of in person appointments. The patient expressed understanding and agreed to proceed.  I discussed the assessment and treatment plan with the patient. The patient was provided an opportunity to ask questions and all were answered. The patient agreed with the plan and demonstrated an understanding of the instructions.   The patient was advised to call back or seek an in-person evaluation if the symptoms worsen or if the condition fails to improve as anticipated.  I provided 20 minutes of non-face-to-face time during this encounter.   [place], LCSW   THERAPIST PROGRESS NOTE  Session Time: 8-820a  Participation Level: Active  Behavioral Response: NeatAlertAnxious  Type of Therapy: Individual Therapy  Treatment Goals addressed: Develop healthy thinking patterns and beliefs about self, others, and the world that lead to the alleviation and help prevent the relapse of depression per self report 3 out of 5 sessions documented   Learn and implement coping skills that result in a reduction of anxiety and worry, and improve daily functioning per pt report 3 out of 5 sessions documented   ProgressTowards Goals: Progressing  Interventions: CBT, Motivational Interviewing, Solution Focused, and Supportive  Summary: Maria Hancock is a 57 y.o. female who presents with improving symptoms related to depression diagnosis.  Patient reports that overall mood has been stable and that she is managing situational stressors well.  Patient reports that  she feels the [facility] is working well to reset her overall sleep schedule, regulate mood, and assist with stress regulation.  Allowed pt to explore and express thoughts and feelings associated with recent life situations and external stressors.  Patient reports that she is still experiencing grief associated with the loss of her brother.  Patient reports that when she misses him, she will frequently call or text his husband, and that makes her feel better.  Allow patient safe space to explore her thoughts surrounding grief.  Reassured patient that her feelings are all part of her overall growth and healing.  Patient reports that she is being intentional about social engagement-patient states that she has spent some quality time with a friend recently, and allowed herself to truly enjoy it. Patient states that work is going well, and she is very happy with the work change.  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  Patient denies any additional concerns at time of session.  Patient reports all relationships are going well, and that she is feeling good.  Continued recommendations are as follows: self care behaviors, positive social engagements, focusing on overall work/home/life balance, and focusing on positive physical and emotional wellness.  .   Suicidal/Homicidal: No  Therapist Response: Pt is continuing to apply interventions learned in session into daily life situations. Pt is currently on track to meet goals utilizing interventions mentioned above. Personal growth and progress noted. Treatment to continue as indicated.   Actively listened and provided continued supports focusing on management of depression, anxiety, and stress.  Used motivational interviewing to review eating behaviors, and  nutritional choices.  Plan: Return again in 8 weeks.  Diagnosis:  Encounter Diagnoses  Name Primary?   MDD (major depressive  disorder), recurrent, in full remission ([facility]) Yes   GAD (generalized anxiety disorder)    Bereavement    Collaboration of Care: Other pt encouraged to continue care with psychiatrist of record, Dr. Ursula Alert  Patient/Guardian was advised Release of Information must be obtained prior to any record release in order to collaborate their care with an outside provider. Patient/Guardian was advised if they have not already done so to contact the registration department to sign all necessary forms in order for [place] to release information regarding their care.   Consent: Patient/Guardian gives verbal consent for treatment and assignment of benefits for services provided during this visit. Patient/Guardian expressed understanding and agreed to proceed.   [place], LCSW 12/26/2022

## 2023-02-20 ENCOUNTER — Ambulatory Visit (INDEPENDENT_AMBULATORY_CARE_PROVIDER_SITE_OTHER): Payer: Medicaid Other | Admitting: Licensed Clinical Social Worker

## 2023-02-20 DIAGNOSIS — F411 Generalized anxiety disorder: Secondary | ICD-10-CM

## 2023-02-20 DIAGNOSIS — F3342 Major depressive disorder, recurrent, in full remission: Secondary | ICD-10-CM

## 2023-02-20 NOTE — Progress Notes (Signed)
Virtual Visit via Video Note  I connected with Maria Hancock on 02/20/23 at  4:00 PM EDT by a video enabled telemedicine application and verified that I am speaking with the correct person using two identifiers.  Pt states that she is not feeling well at time of session.   Location: Patient: home Provider: remote office [place], [place])   I discussed the limitations of evaluation and management by telemedicine and the availability of in person appointments. The patient expressed understanding and agreed to proceed.  I discussed the assessment and treatment plan with the patient. The patient was provided an opportunity to ask questions and all were answered. The patient agreed with the plan and demonstrated an understanding of the instructions.   The patient was advised to call back or seek an in-person evaluation if the symptoms worsen or if the condition fails to improve as anticipated.  I provided 20 minutes of non-face-to-face time during this encounter.   Natally Ribera R Nathanuel Cabreja, LCSW   THERAPIST PROGRESS NOTE  Session Time: 18-420p  Participation Level: Active  Behavioral Response: NeatAlertAnxious  Type of Therapy: Individual Therapy  Treatment Goals addressed: Develop healthy thinking patterns and beliefs about self, others, and the world that lead to the alleviation and help prevent the relapse of depression per self report 3 out of 5 sessions documented   Learn and implement coping skills that result in a reduction of anxiety and worry, and improve daily functioning per pt report 3 out of 5 sessions documented   ProgressTowards Goals: Progressing  Interventions: CBT, Motivational Interviewing, Solution Focused, and Supportive  Summary: Maria Hancock is a 57 y.o. female who presents with improving symptoms related to depression diagnosis.  Patient reports that overall mood has been stable and that she is managing situational stressors well.  Pt reports that she is  compliant with her medication.  On a scale of 1-10 with 1 less intense and 10 most intense symptoms, pt rates depression 0 and anxiety 0 at time of session. Pt reports that she gets some stress situationally, but pt uses her coping skills to manage in the moment.   Pt reports work stress has decreased,and pt is very happy with current workspace/workload.   Discussed previous concern about situational lying behaviors--pt states she is now more self aware and has not been engaging in lying behaviors as much as she has in the past.   Pt reports positive relationships with family and loved ones.   Pt given an opportunity to discuss/explore challenges or concerns--pt denies any concerns, issues, or questions at time of session.  Reviewed coping skills for managing any future depression or anxiety/stress symptoms. Pt reflects understanding.  Continued recommendations are as follows: self care behaviors, positive social engagements, focusing on overall work/home/life balance, and focusing on positive physical and emotional wellness.   Suicidal/Homicidal: No  Therapist Response: Pt is continuing to apply interventions learned in session into daily life situations. Pt is currently on track to meet goals utilizing interventions mentioned above. Personal growth and progress noted. Treatment to continue as indicated.   Actively listened and provided continued supports focusing on management of depression, anxiety, and stress  Plan: Return again in 8 weeks.  Diagnosis:  Encounter Diagnoses  Name Primary?   MDD (major depressive disorder), recurrent, in full remission ([facility]) Yes   GAD (generalized anxiety disorder)     Collaboration of Care: Other pt encouraged to continue care with psychiatrist of record, Dr. Jomarie Longs  Patient/Guardian was advised Release of  Information must be obtained prior to any record release in order to collaborate their care with an outside provider. Patient/Guardian  was advised if they have not already done so to contact the registration department to sign all necessary forms in order for [place] to release information regarding their care.   Consent: Patient/Guardian gives verbal consent for treatment and assignment of benefits for services provided during this visit. Patient/Guardian expressed understanding and agreed to proceed.   Ernest Haber [place] President, LCSW 02/20/2023

## 2023-02-22 DIAGNOSIS — K121 Other forms of stomatitis: Secondary | ICD-10-CM | POA: Insufficient documentation

## 2023-02-23 ENCOUNTER — Telehealth: Payer: Medicaid Other | Admitting: Psychiatry

## 2023-02-23 ENCOUNTER — Encounter: Payer: Self-pay | Admitting: Psychiatry

## 2023-02-23 DIAGNOSIS — F3342 Major depressive disorder, recurrent, in full remission: Secondary | ICD-10-CM | POA: Diagnosis not present

## 2023-02-23 DIAGNOSIS — F411 Generalized anxiety disorder: Secondary | ICD-10-CM

## 2023-02-23 DIAGNOSIS — Z634 Disappearance and death of family member: Secondary | ICD-10-CM

## 2023-02-23 DIAGNOSIS — Z79899 Other long term (current) drug therapy: Secondary | ICD-10-CM

## 2023-02-23 DIAGNOSIS — F4010 Social phobia, unspecified: Secondary | ICD-10-CM | POA: Diagnosis not present

## 2023-02-23 MED ORDER — PROPRANOLOL HCL 10 MG PO TABS: 10.0000 mg | ORAL_TABLET | Freq: Two times a day (BID) | ORAL | 2 refills | Status: DC | PRN

## 2023-02-23 MED ORDER — REXULTI 0.5 MG PO TABS: 0.5000 mg | ORAL_TABLET | Freq: Every day | ORAL | 1 refills | Status: DC

## 2023-02-23 NOTE — Progress Notes (Signed)
Virtual Visit via Video Note  I connected with Maria Hancock on 02/23/23 at  4:30 PM EDT by a video enabled telemedicine application and verified that I am speaking with the correct person using two identifiers.  Location Provider Location : [facility] Patient Location : Home  Participants: Patient , Provider   I discussed the limitations of evaluation and management by telemedicine and the availability of in person appointments. The patient expressed understanding and agreed to proceed.   I discussed the assessment and treatment plan with the patient. The patient was provided an opportunity to ask questions and all were answered. The patient agreed with the plan and demonstrated an understanding of the instructions.   The patient was advised to call back or seek an in-person evaluation if the symptoms worsen or if the condition fails to improve as anticipated.    [facility] MD OP Progress Note  02/23/2023 4:58 PM Maria Hancock  MRN:  [id number]  Chief Complaint:  Chief Complaint  Patient presents with   Follow-up   Anxiety   Depression   Medication Refill   HPI: Maria Hancock is a 57 year old Caucasian female, married, employed, lives in [place], has a history of MDD, GAD, social anxiety, bereavement, diabetes mellitus, hyperlipidemia, hypertension, history of left knee arthroplasty, chronic back pain, arthralgia was evaluated by telemedicine today.  Patient today reports she is currently doing well on the current medication regimen.  Currently compliant on the Rexulti, Pristiq.  Denies side effects.  Patient reports sleep as good.  She continues to have overactive bladder however she has been managing it better than before.  Patient reports appetite is fair.  She was recently started on Ozempic.  However she gained weight on the Ozempic.  She is currently working with her providers on the same.  Patient denies any suicidality, homicidality or perceptual  disturbances.  Patient was unable to get labs done as ordered last visit including prolactin level, lipid panel, hemoglobin A1c-increased to get it done.  Patient denies any other concerns today.  Visit Diagnosis:    ICD-10-CM   1. MDD (major depressive disorder), recurrent, in full remission (HCC)  F33.42 Brexpiprazole (REXULTI) 0.5 MG TABS    2. GAD (generalized anxiety disorder)  F41.1 propranolol (INDERAL) 10 MG tablet    3. Social anxiety disorder  F40.10 propranolol (INDERAL) 10 MG tablet    4. Bereavement  Z63.4     5. High risk medication use  Z79.899       Past Psychiatric History: Reviewed past psychiatric history from progress note on 07/13/2022.  Past Medical History:  Past Medical History:  Diagnosis Date   Anxiety    Arthritis    "knees, back, hands" (02/26/2013)   Depression    Diabetes mellitus without complication (HCC)    GERD (gastroesophageal reflux disease)    OTC med as needed   Hyperlipidemia    Hypertension    Hypothyroidism    IBS (irritable bowel syndrome)    Menorrhagia    Migraine headache    Psoriasis     Past Surgical History:  Procedure Laterality Date   CARPAL TUNNEL RELEASE Right 12/22/2021   Procedure: RIGHT CARPAL TUNNEL RELEASE;  Surgeon: Marlyne Beards, MD;  Location: [facility];  Service: Orthopedics;  Laterality: Right;   HARDWARE REMOVAL Left 02/26/2013   Procedure: HARDWARE REMOVAL;  Surgeon: Cammy Copa, MD;  Location: [facility] OR;  Service: Orthopedics;  Laterality: Left;  Left Knee Removal of Hardware   JOINT REPLACEMENT  KNEE ARTHROPLASTY Left ?2001   "put hardware in" (02/26/2013)   KNEE ARTHROSCOPY Left    "I've had 2; 2nd one was 2013" (02/26/2013)   REPLACEMENT TOTAL KNEE Left 02/26/2013   "w/hardware removal" (02/26/2013)   TOTAL KNEE ARTHROPLASTY Left 02/26/2013   Procedure: TOTAL KNEE ARTHROPLASTY;  Surgeon: Cammy Copa, MD;  Location: [facility] OR;  Service: Orthopedics;  Laterality: Left;  Left  Total Knee Arthroplasty    TUBAL LIGATION  1988    Family Psychiatric History: Reviewed family psychiatric history from progress note on 07/13/2022.  Family History:  Family History  Problem Relation Age of Onset   Cirrhosis Father    Alcohol abuse Brother    Intellectual disability Maternal Aunt    Heart disease Maternal Grandmother    Intellectual disability Son    Drug abuse Son     Social History: Reviewed social history from progress note on 07/13/2022. Social History   Socioeconomic History   Marital status: Married    Spouse name: Not on file   Number of children: 3   Years of education: Not on file   Highest education level: High school graduate  Occupational History   Not on file  Tobacco Use   Smoking status: Former    Packs/day: 1.00    Years: 16.00    Additional pack years: 0.00    Total pack years: 16.00    Types: Cigarettes    Quit date: 10/04/1995    Years since quitting: 27.4   Smokeless tobacco: Never  Vaping Use   Vaping Use: Never used  Substance and Sexual Activity   Alcohol use: No   Drug use: Not Currently    Types: Marijuana   Sexual activity: Not Currently    Birth control/protection: Surgical    Comment: BTL  Other Topics Concern   Not on file  Social History Narrative   Not on file   Social Determinants of Health   Financial Resource Strain: Not on file  Food Insecurity: Not on file  Transportation Needs: Not on file  Physical Activity: Not on file  Stress: Not on file  Social Connections: Not on file    Allergies: No Known Allergies  Metabolic Disorder Labs: Lab Results  Component Value Date   HGBA1C 7.3 (H) 12/04/2015   MPG 163 12/04/2015   No results found for: "PROLACTIN" No results found for: "CHOL", "TRIG", "HDL", "CHOLHDL", "VLDL", "LDLCALC" Lab Results  Component Value Date   TSH 7.285 (H) 12/04/2015    Therapeutic Level Labs: No results found for: "LITHIUM" No results found for: "VALPROATE" No results  found for: "CBMZ"  Current Medications: Current Outpatient Medications  Medication Sig Dispense Refill   amLODipine (NORVASC) 5 MG tablet Take 5 mg by mouth daily.     atorvastatin (LIPITOR) 40 MG tablet Take 40 mg by mouth daily.     desloratadine (CLARINEX) 5 MG tablet Take 1 tablet (5 mg total) by mouth daily. 30 tablet 2   desvenlafaxine (PRISTIQ) 100 MG 24 hr tablet Take 100 mg by mouth daily.     fluticasone (FLONASE) 50 MCG/ACT nasal spray Place 2 sprays into both nostrils daily. 18.2 mL 11   JANUMET 50-1000 MG tablet Take 1 tablet by mouth 2 (two) times daily.     Potassium 99 MG TABS Take by mouth.     pregabalin (LYRICA) 100 MG capsule Take 100 mg by mouth 3 (three) times daily.     Brexpiprazole (REXULTI) 0.5 MG TABS Take 1 tablet (0.5  mg total) by mouth at bedtime. 90 tablet 1   propranolol (INDERAL) 10 MG tablet Take 1 tablet (10 mg total) by mouth 2 (two) times daily as needed. For severe anxiety only 60 tablet 2   No current facility-administered medications for this visit.     Musculoskeletal: Strength & Muscle Tone:  UTA Gait & Station:  Seated Patient leans: N/A  Psychiatric Specialty Exam: Review of Systems  Psychiatric/Behavioral: Negative.    All other systems reviewed and are negative.   There were no vitals taken for this visit.There is no height or weight on file to calculate BMI.  General Appearance: Casual  Eye Contact:  Fair  Speech:  Clear and Coherent  Volume:  Normal  Mood:  Euthymic  Affect:  Congruent  Thought Process:  Goal Directed and Descriptions of Associations: Intact  Orientation:  Full (Time, Place, and Person)  Thought Content: Logical   Suicidal Thoughts:  No  Homicidal Thoughts:  No  Memory:  Immediate;   Fair Recent;   Fair Remote;   Fair  Judgement:  Fair  Insight:  Fair  Psychomotor Activity:  Normal  Concentration:  Concentration: Fair and Attention Span: Fair  Recall:  [employer] of Knowledge: Fair  Language: Fair   Akathisia:  No  Handed:  Right  AIMS (if indicated): not done  Assets:  Communication Skills Desire for Improvement Housing Social Support  ADL's:  Intact  Cognition: WNL  Sleep:  Fair   Screenings: AIMS    Flowsheet Row Video Visit from 09/27/2022 in [facility] Office Visit from 07/13/2022 in [facility] (Dis[facility]  AIMS Total Score 0 0 0      AUDIT    Flowsheet Row [facility]charged[facility]  Alcohol Use Disorder Identification Test Final Score (AUDIT) 0      GAD-7    Flowsheet Row Counselor from 08/31/2022 in [facility]  Total GAD-7 Score 8      PHQ2-9    Flowsheet Row Counselor from 08/31/2022 in [facility] Video Visit from 08/16/2022 in [facility] Office Visit from 07/13/2022 in [facility]  PHQ-2 Total Score 3 4 4   PHQ-9 Total Score 17 11 15       Flowsheet Row Video Visit from 02/23/2023 in [facility] ED from 12/05/2022 in [facility] Video Visit from 11/24/2022 in [facility]  C-SSRS RISK CATEGORY No Risk No Risk No Risk        Assessment and Plan: Maria Hancock is a 57 year old Caucasian female, married, employed, has a history of depression, anxiety, multiple medical problems was evaluated by telemedicine today.  Patient is currently stable.  Plan as noted below.  Plan  MDD in remission Rexulti 0.5 mg p.o. nightly Pristiq 100 mg p.o. daily Continue CBT with Ms. Christina Hussami   GAD-stable Continue CBT Pristiq 100 mg p.o. daily  Social  anxiety disorder-improving Propranolol 10 mg p.o. twice daily as needed for anxiety attacks Continue CBT  Bereavement-improving Continue CBT  High risk medication use-pending labs -ordered last visit-patient encouraged to get it done including prolactin level, lipid panel, hemoglobin A1c.  Follow-up in clinic in 3 months or sooner if needed. Collaboration  of Care: Collaboration of Care: Other I have reviewed notes per Ms. Christina Hussami -dated 02/20/2023-patient undergoing CBT, motivational interviewing.  Patient/Guardian was advised Release of Information must be obtained prior to any record release in order to collaborate their care with an outside provider. Patient/Guardian was advised if they have not already done so to contact the registration department to sign all necessary forms in order for [place] to release information regarding their care.   Consent: Patient/Guardian gives verbal consent for treatment and assignment of benefits for services provided during this visit. Patient/Guardian expressed understanding and agreed to proceed.   This note was generated in part or whole with voice recognition software. Voice recognition is usually quite accurate but there are transcription errors that can and very often do occur. I apologize for any typographical errors that were not detected and corrected.    Jomarie Longs, MD 02/23/2023, 4:58 PM

## 2023-03-02 ENCOUNTER — Ambulatory Visit
Admission: RE | Admit: 2023-03-02 | Discharge: 2023-03-02 | Disposition: A | Discharge status: Home / Self Care | Payer: Medicaid Other | Source: Ambulatory Visit | Attending: Family Medicine | Admitting: Family Medicine

## 2023-03-02 VITALS — BP 152/88 | HR 111 | Temp 98.4°F | Resp 22

## 2023-03-02 DIAGNOSIS — J209 Acute bronchitis, unspecified: Secondary | ICD-10-CM | POA: Diagnosis not present

## 2023-03-02 DIAGNOSIS — J019 Acute sinusitis, unspecified: Secondary | ICD-10-CM

## 2023-03-02 MED ORDER — DOXYCYCLINE HYCLATE 100 MG PO CAPS: 100.0000 mg | ORAL_CAPSULE | Freq: Two times a day (BID) | ORAL | 0 refills | Status: AC

## 2023-03-02 MED ORDER — ALBUTEROL SULFATE HFA 108 (90 BASE) MCG/ACT IN AERS: 2.0000 | INHALATION_SPRAY | RESPIRATORY_TRACT | 0 refills | Status: DC | PRN

## 2023-03-02 MED ORDER — BENZONATATE 100 MG PO CAPS: 100.0000 mg | ORAL_CAPSULE | Freq: Three times a day (TID) | ORAL | 0 refills | Status: DC | PRN

## 2023-03-02 MED ORDER — METHYLPREDNISOLONE ACETATE 80 MG/ML IJ SUSP
80.0000 mg | Freq: Once | INTRAMUSCULAR | Status: AC
  Administered 2023-03-02: 80 mg via INTRAMUSCULAR

## 2023-03-02 NOTE — Discharge Instructions (Addendum)
Albuterol inhaler--do 2 puffs every 4 hours as needed for shortness of breath or wheezing  Take doxycycline 100 mg --1 capsule 2 times daily for 7 days  Take benzonatate 100 mg, 1 tab every 8 hours as needed for cough.

## 2023-03-02 NOTE — ED Triage Notes (Signed)
Pt reports cough, wheezing and shortness of breath  x 6 days. Robitussin gives no relief.

## 2023-03-02 NOTE — ED Provider Notes (Addendum)
[name]    CSN: [id number] Arrival date & time: 03/02/23  0844      History   Chief Complaint Chief Complaint  Patient presents with   Cough    Have had a cough since last Friday - Entered by patient   Appointment    0900    HPI Maria Hancock is a 57 y.o. female.    Cough  Here for cough and congestion.  Symptoms began on May 24.  Then yesterday the cough got a lot more persistent and pronounced.  And she started feeling worse.  She had noted wheezing since yesterday.  She has never had asthma or COPD that she knows of, and she has never used an inhaler before  No fever noted.  No nausea or vomiting.  She does note some sinus congestion and postnasal drainage also.    She has smoked cigarettes in the past but it has been many years since she did.  Past medical history also includes diabetes  Past Medical History:  Diagnosis Date   Anxiety    Arthritis    "knees, back, hands" (02/26/2013)   Depression    Diabetes mellitus without complication (HCC)    GERD (gastroesophageal reflux disease)    OTC med as needed   Hyperlipidemia    Hypertension    Hypothyroidism    IBS (irritable bowel syndrome)    Menorrhagia    Migraine headache    Psoriasis     Patient Active Problem List   Diagnosis Date Noted   High risk medication use 11/24/2022   MDD (major depressive disorder), recurrent episode, moderate (HCC) 07/13/2022   GAD (generalized anxiety disorder) 07/13/2022   Social anxiety disorder 07/13/2022   Bereavement 07/13/2022   At risk for prolonged QT interval syndrome 07/13/2022   Impingement syndrome of right shoulder 03/31/2022   Carpal tunnel syndrome, right upper limb 12/13/2021   Unspecified osteoarthritis, unspecified site 09/18/2021   Type 2 diabetes mellitus (HCC) 01/08/2021   Hypertensive disorder 01/08/2021   Neuropathy 01/08/2021   Atypical chest pain 05/31/2018   SOB (shortness of breath) 05/31/2018   Lightheadedness  05/31/2018   Palpitations 05/31/2018   Morbid obesity (HCC) 05/31/2018   Plantar fasciitis of left foot 01/26/2018   Major depression, recurrent, chronic (HCC) 12/02/2015   Osteoarthritis of left knee 02/26/2013    Class: Diagnosis of    Past Surgical History:  Procedure Laterality Date   CARPAL TUNNEL RELEASE Right 12/22/2021   Procedure: RIGHT CARPAL TUNNEL RELEASE;  Surgeon: Marlyne Beards, MD;  Location: [facility];  Service: Orthopedics;  Laterality: Right;   HARDWARE REMOVAL Left 02/26/2013   Procedure: HARDWARE REMOVAL;  Surgeon: Cammy Copa, MD;  Location: [facility] OR;  Service: Orthopedics;  Laterality: Left;  Left Knee Removal of Hardware   JOINT REPLACEMENT     KNEE ARTHROPLASTY Left ?2001   "put hardware in" (02/26/2013)   KNEE ARTHROSCOPY Left    "I've had 2; 2nd one was 2013" (02/26/2013)   REPLACEMENT TOTAL KNEE Left 02/26/2013   "w/hardware removal" (02/26/2013)   TOTAL KNEE ARTHROPLASTY Left 02/26/2013   Procedure: TOTAL KNEE ARTHROPLASTY;  Surgeon: Cammy Copa, MD;  Location: [facility] OR;  Service: Orthopedics;  Laterality: Left;  Left Total Knee Arthroplasty    TUBAL LIGATION  1988    OB History   No obstetric history on file.      Home Medications    Prior to Admission medications   Medication Sig Start Date  End Date Taking? Authorizing Provider  albuterol (VENTOLIN HFA) 108 (90 Base) MCG/ACT inhaler Inhale 2 puffs into the lungs every 4 (four) hours as needed for wheezing or shortness of breath. 03/02/23  Yes Zenia Resides, MD  benzonatate (TESSALON) 100 MG capsule Take 1 capsule (100 mg total) by mouth 3 (three) times daily as needed for cough. 03/02/23  Yes Zenia Resides, MD  doxycycline (VIBRAMYCIN) 100 MG capsule Take 1 capsule (100 mg total) by mouth 2 (two) times daily for 7 days. 03/02/23 03/09/23 Yes Zenia Resides, MD  Pseudoephedrine-DM-GG (ROBITUSSIN CF PO) Take by mouth.   Yes [provider]  amLODipine  (NORVASC) 5 MG tablet Take 5 mg by mouth daily.    [provider]  atorvastatin (LIPITOR) 40 MG tablet Take 40 mg by mouth daily.    [provider]  Brexpiprazole (REXULTI) 0.5 MG TABS Take 1 tablet (0.5 mg total) by mouth at bedtime. 02/23/23   Jomarie Longs, MD  desloratadine (CLARINEX) 5 MG tablet Take 1 tablet (5 mg total) by mouth daily. 05/27/22 05/27/23  Raechel Chute, MD  desvenlafaxine (PRISTIQ) 100 MG 24 hr tablet Take 100 mg by mouth daily.    [provider]  fluticasone (FLONASE) 50 MCG/ACT nasal spray Place 2 sprays into both nostrils daily. 05/27/22 05/27/23  Raechel Chute, MD  JANUMET 50-1000 MG tablet Take 1 tablet by mouth 2 (two) times daily. 12/08/22   [provider]  Potassium 99 MG TABS Take by mouth.    [provider]  pregabalin (LYRICA) 100 MG capsule Take 100 mg by mouth 3 (three) times daily. 06/07/22   [provider]  propranolol (INDERAL) 10 MG tablet Take 1 tablet (10 mg total) by mouth 2 (two) times daily as needed. For severe anxiety only 02/23/23   Eappen, Levin Bacon, MD  FLUoxetine (PROZAC) 20 MG tablet TAKE 1 AND ONE-HALF TABLETS (30MG  TOTAL) BY MOUTH DAILY. 05/07/18 04/14/20  [provider]    Family History Family History  Problem Relation Age of Onset   Cirrhosis Father    Alcohol abuse Brother    Intellectual disability Maternal Aunt    Heart disease Maternal Grandmother    Intellectual disability Son    Drug abuse Son     Social History Social History   Tobacco Use   Smoking status: Former    Packs/day: 1.00    Years: 16.00    Additional pack years: 0.00    Total pack years: 16.00    Types: Cigarettes    Quit date: 10/04/1995    Years since quitting: 27.4   Smokeless tobacco: Never  Vaping Use   Vaping Use: Never used  Substance Use Topics   Alcohol use: No   Drug use: Not Currently    Types: Marijuana     Allergies   Patient has no known allergies.   Review of  Systems Review of Systems  Respiratory:  Positive for cough.      Physical Exam Triage Vital Signs ED Triage Vitals  Enc Vitals Group     BP 03/02/23 0852 (!) 152/88     Pulse Rate 03/02/23 0852 (!) 111     Resp 03/02/23 0852 (!) 22     Temp 03/02/23 0852 98.4 F (36.9 C)     Temp Source 03/02/23 0852 Oral     SpO2 03/02/23 0852 90 %     Weight --      Height --      Head Circumference --  Peak Flow --      Pain Score 03/02/23 0854 0     Pain Loc --      Pain Edu? --      Excl. in GC? --    No data found.  Updated Vital Signs BP (!) 152/88 (BP Location: Left Wrist)   Pulse (!) 111   Temp 98.4 F (36.9 C) (Oral)   Resp (!) 22   SpO2 90%   Visual Acuity Right Eye Distance:   Left Eye Distance:   Bilateral Distance:    Right Eye Near:   Left Eye Near:    Bilateral Near:     Physical Exam Vitals reviewed.  Constitutional:      General: She is not in acute distress.    Appearance: She is not toxic-appearing or diaphoretic.  HENT:     Right Ear: Tympanic membrane and ear canal normal.     Left Ear: Tympanic membrane and ear canal normal.     Nose: Congestion present.     Mouth/Throat:     Mouth: Mucous membranes are moist.     Comments: There is some mild erythema of the oropharynx. Eyes:     Extraocular Movements: Extraocular movements intact.     Conjunctiva/sclera: Conjunctivae normal.     Pupils: Pupils are equal, round, and reactive to light.  Cardiovascular:     Rate and Rhythm: Normal rate and regular rhythm.     Heart sounds: No murmur heard. Pulmonary:     Effort: No respiratory distress.     Breath sounds: No stridor.     Comments: There are some crackles heard in the upper lung fields.  Air movement is normal, and there is no wheezing heard at the time of exam Chest:     Chest wall: No tenderness.  Musculoskeletal:     Cervical back: Neck supple.  Lymphadenopathy:     Cervical: No cervical adenopathy.  Skin:    Capillary Refill:  Capillary refill takes less than 2 seconds.     Coloration: Skin is not jaundiced or pale.  Neurological:     General: No focal deficit present.     Mental Status: She is alert and oriented to person, place, and time.  Psychiatric:        Behavior: Behavior normal.      UC Treatments / Results  Labs (all labs ordered are listed, but only abnormal results are displayed) Labs Reviewed - No data to display  EKG   Radiology No results found.  Procedures Procedures (including critical care time)  Medications Ordered in UC Medications  methylPREDNISolone acetate (DEPO-MEDROL) injection 80 mg (has no administration in time range)    Initial Impression / Assessment and Plan / UC Course  I have reviewed the triage vital signs and the nursing notes.  Pertinent labs & imaging results that were available during my care of the patient were reviewed by me and considered in my medical decision making (see chart for details).        With the crackles I can hear, I have ordered a chest x-ray for her to do outpatient.  We do not have x-ray in the building. I will start doxycycline for possible sinus infection and possible pneumonia.  Depo-Medrol is given here in case there is still some bronchospasm going on.  Albuterol sent in for to the pharmacy for her to try.  She is to go to the ER if worsening in any way Final Clinical Impressions(s) / UC  Diagnoses   Final diagnoses:  Acute sinusitis, recurrence not specified, unspecified location  Acute bronchitis, unspecified organism     Discharge Instructions      Albuterol inhaler--do 2 puffs every 4 hours as needed for shortness of breath or wheezing  Take doxycycline 100 mg --1 capsule 2 times daily for 7 days  Take benzonatate 100 mg, 1 tab every 8 hours as needed for cough.       ED Prescriptions     Medication Sig Dispense Auth. Provider   doxycycline (VIBRAMYCIN) 100 MG capsule Take 1 capsule (100 mg total) by mouth  2 (two) times daily for 7 days. 14 capsule Yisel Megill, Janace Aris, MD   benzonatate (TESSALON) 100 MG capsule Take 1 capsule (100 mg total) by mouth 3 (three) times daily as needed for cough. 21 capsule Zenia Resides, MD   albuterol (VENTOLIN HFA) 108 (90 Base) MCG/ACT inhaler Inhale 2 puffs into the lungs every 4 (four) hours as needed for wheezing or shortness of breath. 1 each Zenia Resides, MD      PDMP not reviewed this encounter.   Zenia Resides, MD 03/02/23 1610    Zenia Resides, MD 03/02/23 [phone]

## 2023-03-06 ENCOUNTER — Ambulatory Visit (INDEPENDENT_AMBULATORY_CARE_PROVIDER_SITE_OTHER): Payer: Medicaid Other

## 2023-03-06 ENCOUNTER — Ambulatory Visit
Admission: EM | Admit: 2023-03-06 | Discharge: 2023-03-06 | Disposition: A | Discharge status: Home / Self Care | Payer: Medicaid Other | Attending: Family Medicine | Admitting: Family Medicine

## 2023-03-06 DIAGNOSIS — F3342 Major depressive disorder, recurrent, in full remission: Secondary | ICD-10-CM

## 2023-03-06 DIAGNOSIS — J209 Acute bronchitis, unspecified: Secondary | ICD-10-CM | POA: Diagnosis not present

## 2023-03-06 MED ORDER — PREDNISONE 20 MG PO TABS: 20.0000 mg | ORAL_TABLET | Freq: Every day | ORAL | 0 refills | Status: AC

## 2023-03-06 MED ORDER — PROMETHAZINE-DM 6.25-15 MG/5ML PO SYRP: 5.0000 mL | ORAL_SOLUTION | Freq: Four times a day (QID) | ORAL | 0 refills | Status: DC | PRN

## 2023-03-06 MED ORDER — IPRATROPIUM-ALBUTEROL 0.5-2.5 (3) MG/3ML IN SOLN
3.0000 mL | Freq: Once | RESPIRATORY_TRACT | Status: AC
  Administered 2023-03-06: 3 mL via RESPIRATORY_TRACT

## 2023-03-06 NOTE — ED Triage Notes (Addendum)
Pt continues with sxs of cough, wheezing and shortness of breath starting 5/24. Pt states she has loss of taste and smell today.

## 2023-03-06 NOTE — Discharge Instructions (Signed)
Complete entire course of antibiotics that you are currently taking.  I am adding prednisone 20 mg once daily for 5 days.  Be sure to tell your healthcare provider that you were recently prescribed prednisone as your blood sugar may temporarily be elevated during your upcoming primary care visit.  You may continue the benzonatate Perles but I am also prescribing you Promethazine DM this will also help with management of your cough.  If you develop any worsening symptoms of shortness of breath and/or wheezing that does not improve with the albuterol inhaler that you were prescribed Go immediately to the emergency department for further evaluation.

## 2023-03-06 NOTE — ED Provider Notes (Signed)
[name]    CSN: [id number] Arrival date & time: 03/06/23  1052      History   Chief Complaint Chief Complaint  Patient presents with   Cough    HPI Maria Hancock is a 57 y.o. female.   HPI Patient seen on May 30.  [facility].  Patient was treated for acute sinusitis and at that time there was some concern for possible lower respiratory infection and an outpatient x-ray was ordered by the provider.  Today patient reports she was unable to get to the other location to get the x-ray performed as there was no x-ray available onsite that day.  Has been taking the doxycycline and cough tablets that were prescribed to her.  She was also prescribed an albuterol inhaler which she has also been using. She reports that she has continued to experience some shortness of breath and noticed wheezing when laying in bed during the nighttime. She is a smoker but has no history of asthma or COPD.  Is also diabetic and has an appointment scheduled for June 6 for a annual physical and chronic condition follow-up.  She reports she does not routinely check her blood sugars at home.  She is currently on Ozempic and Janumet.  On arrival patient's oxygen level fluctuated between 90%, 92% and 95% at rest. Past Medical History:  Diagnosis Date   Anxiety    Arthritis    "knees, back, hands" (02/26/2013)   Depression    Diabetes mellitus without complication (HCC)    GERD (gastroesophageal reflux disease)    OTC med as needed   Hyperlipidemia    Hypertension    Hypothyroidism    IBS (irritable bowel syndrome)    Menorrhagia    Migraine headache    Psoriasis     Patient Active Problem List   Diagnosis Date Noted   High risk medication use 11/24/2022   MDD (major depressive disorder), recurrent episode, moderate (HCC) 07/13/2022   GAD (generalized anxiety disorder) 07/13/2022   Social anxiety disorder 07/13/2022   Bereavement 07/13/2022   At risk for prolonged QT interval  syndrome 07/13/2022   Impingement syndrome of right shoulder 03/31/2022   Carpal tunnel syndrome, right upper limb 12/13/2021   Unspecified osteoarthritis, unspecified site 09/18/2021   Type 2 diabetes mellitus (HCC) 01/08/2021   Hypertensive disorder 01/08/2021   Neuropathy 01/08/2021   Atypical chest pain 05/31/2018   SOB (shortness of breath) 05/31/2018   Lightheadedness 05/31/2018   Palpitations 05/31/2018   Morbid obesity (HCC) 05/31/2018   Plantar fasciitis of left foot 01/26/2018   Major depression, recurrent, chronic (HCC) 12/02/2015   Osteoarthritis of left knee 02/26/2013    Class: Diagnosis of    Past Surgical History:  Procedure Laterality Date   CARPAL TUNNEL RELEASE Right 12/22/2021   Procedure: RIGHT CARPAL TUNNEL RELEASE;  Surgeon: Marlyne Beards, MD;  Location: [facility];  Service: Orthopedics;  Laterality: Right;   HARDWARE REMOVAL Left 02/26/2013   Procedure: HARDWARE REMOVAL;  Surgeon: Cammy Copa, MD;  Location: [facility] OR;  Service: Orthopedics;  Laterality: Left;  Left Knee Removal of Hardware   JOINT REPLACEMENT     KNEE ARTHROPLASTY Left ?2001   "put hardware in" (02/26/2013)   KNEE ARTHROSCOPY Left    "I've had 2; 2nd one was 2013" (02/26/2013)   REPLACEMENT TOTAL KNEE Left 02/26/2013   "w/hardware removal" (02/26/2013)   TOTAL KNEE ARTHROPLASTY Left 02/26/2013   Procedure: TOTAL KNEE ARTHROPLASTY;  Surgeon: Corrie Mckusick  August Saucer, MD;  Location: [facility] OR;  Service: Orthopedics;  Laterality: Left;  Left Total Knee Arthroplasty    TUBAL LIGATION  1988    OB History   No obstetric history on file.      Home Medications    Prior to Admission medications   Medication Sig Start Date End Date Taking? Authorizing Provider  predniSONE (DELTASONE) 20 MG tablet Take 1 tablet (20 mg total) by mouth daily for 5 days. 03/06/23 03/11/23 Yes Bing Neighbors, NP  promethazine-dextromethorphan (PROMETHAZINE-DM) 6.25-15 MG/5ML syrup Take 5 mLs by mouth 4  (four) times daily as needed for cough. 03/06/23  Yes Bing Neighbors, NP  albuterol (VENTOLIN HFA) 108 (90 Base) MCG/ACT inhaler Inhale 2 puffs into the lungs every 4 (four) hours as needed for wheezing or shortness of breath. 03/02/23   Zenia Resides, MD  amLODipine (NORVASC) 5 MG tablet Take 5 mg by mouth daily.    [provider]  atorvastatin (LIPITOR) 40 MG tablet Take 40 mg by mouth daily.    [provider]  benzonatate (TESSALON) 100 MG capsule Take 1 capsule (100 mg total) by mouth 3 (three) times daily as needed for cough. 03/02/23   Zenia Resides, MD  Brexpiprazole (REXULTI) 0.5 MG TABS Take 1 tablet (0.5 mg total) by mouth at bedtime. 02/23/23   Jomarie Longs, MD  desloratadine (CLARINEX) 5 MG tablet Take 1 tablet (5 mg total) by mouth daily. 05/27/22 05/27/23  Raechel Chute, MD  desvenlafaxine (PRISTIQ) 100 MG 24 hr tablet Take 100 mg by mouth daily.    [provider]  fluticasone (FLONASE) 50 MCG/ACT nasal spray Place 2 sprays into both nostrils daily. 05/27/22 05/27/23  Raechel Chute, MD  JANUMET 50-1000 MG tablet Take 1 tablet by mouth 2 (two) times daily. 12/08/22   [provider]  Potassium 99 MG TABS Take by mouth.    [provider]  pregabalin (LYRICA) 100 MG capsule Take 100 mg by mouth 3 (three) times daily. 06/07/22   [provider]  propranolol (INDERAL) 10 MG tablet Take 1 tablet (10 mg total) by mouth 2 (two) times daily as needed. For severe anxiety only 02/23/23   Eappen, Levin Bacon, MD  FLUoxetine (PROZAC) 20 MG tablet TAKE 1 AND ONE-HALF TABLETS (30MG  TOTAL) BY MOUTH DAILY. 05/07/18 04/14/20  [provider]    Family History Family History  Problem Relation Age of Onset   Cirrhosis Father    Alcohol abuse Brother    Intellectual disability Maternal Aunt    Heart disease Maternal Grandmother    Intellectual disability Son    Drug abuse Son     Social History Social History   Tobacco Use    Smoking status: Former    Packs/day: 1.00    Years: 16.00    Additional pack years: 0.00    Total pack years: 16.00    Types: Cigarettes    Quit date: 10/04/1995    Years since quitting: 27.4   Smokeless tobacco: Never  Vaping Use   Vaping Use: Never used  Substance Use Topics   Alcohol use: No   Drug use: Not Currently    Types: Marijuana     Allergies   Patient has no known allergies.   Review of Systems Review of Systems Pertinent negatives listed in HPI   Physical Exam Triage Vital Signs ED Triage Vitals  Enc Vitals Group     BP 03/06/23 1327 136/85     Pulse Rate 03/06/23 1327 (!)  102     Resp 03/06/23 1327 (!) 22     Temp 03/06/23 1327 97.8 F (36.6 C)     Temp Source 03/06/23 1327 Oral     SpO2 03/06/23 1327 95 %     Weight --      Height --      Head Circumference --      Peak Flow --      Pain Score 03/06/23 1324 5     Pain Loc --      Pain Edu? --      Excl. in GC? --    No data found.  Updated Vital Signs BP 136/85 (BP Location: Right Arm)   Pulse (!) 102   Temp 97.8 F (36.6 C) (Oral)   Resp (!) 22   SpO2 95%   Visual Acuity Right Eye Distance:   Left Eye Distance:   Bilateral Distance:    Right Eye Near:   Left Eye Near:    Bilateral Near:     Physical Exam Vitals reviewed.  Constitutional:      Appearance: She is ill-appearing.  HENT:     Head: Normocephalic and atraumatic.     Nose: Congestion and rhinorrhea present.  Eyes:     Extraocular Movements: Extraocular movements intact.     Pupils: Pupils are equal, round, and reactive to light.  Cardiovascular:     Rate and Rhythm: Normal rate and regular rhythm.  Pulmonary:     Breath sounds: Decreased air movement present. Wheezing and rhonchi present.  Musculoskeletal:     Cervical back: Normal range of motion and neck supple.  Skin:    General: Skin is warm.     Capillary Refill: Capillary refill takes less than 2 seconds.  Neurological:     General: No focal deficit  present.     Mental Status: She is alert.      UC Treatments / Results  Labs (all labs ordered are listed, but only abnormal results are displayed) Labs Reviewed - No data to display  EKG   Radiology No results found.  Procedures Procedures (including critical care time)  Medications Ordered in UC Medications  ipratropium-albuterol (DUONEB) 0.5-2.5 (3) MG/3ML nebulizer solution 3 mL (3 mLs Nebulization Given 03/06/23 1357)    Initial Impression / Assessment and Plan / UC Course  I have reviewed the triage vital signs and the nursing notes.  Pertinent labs & imaging results that were available during my care of the patient were reviewed by me and considered in my medical decision making (see chart for details).    Acute bronchitis, treatment with Promethazine DM for cough and prednisone 20 mg once daily for 5 days to decrease inflammation in lungs.  Patient is a diabetic and encouraged to check blood sugars routinely while taking prednisone.  Patient also encouraged to continue and complete current course of antibiotics.  Strict ED precautions given if symptoms worsen or do not improve. Final Clinical Impressions(s) / UC Diagnoses   Final diagnoses:  Acute bronchitis, unspecified organism     Discharge Instructions      Complete entire course of antibiotics that you are currently taking.  I am adding prednisone 20 mg once daily for 5 days.  Be sure to tell your healthcare provider that you were recently prescribed prednisone as your blood sugar may temporarily be elevated during your upcoming primary care visit.  You may continue the benzonatate Perles but I am also prescribing you Promethazine DM this will also  help with management of your cough.  If you develop any worsening symptoms of shortness of breath and/or wheezing that does not improve with the albuterol inhaler that you were prescribed Go immediately to the emergency department for further evaluation.     ED  Prescriptions     Medication Sig Dispense Auth. Provider   promethazine-dextromethorphan (PROMETHAZINE-DM) 6.25-15 MG/5ML syrup Take 5 mLs by mouth 4 (four) times daily as needed for cough. 240 mL Bing Neighbors, NP   predniSONE (DELTASONE) 20 MG tablet Take 1 tablet (20 mg total) by mouth daily for 5 days. 5 tablet Bing Neighbors, NP      PDMP not reviewed this encounter.   Bing Neighbors, NP 03/11/23 1135

## 2023-03-08 DIAGNOSIS — Z79899 Other long term (current) drug therapy: Secondary | ICD-10-CM | POA: Insufficient documentation

## 2023-03-12 ENCOUNTER — Telehealth: Payer: Medicaid Other | Admitting: Nurse Practitioner

## 2023-03-12 DIAGNOSIS — R399 Unspecified symptoms and signs involving the genitourinary system: Secondary | ICD-10-CM | POA: Diagnosis not present

## 2023-03-12 MED ORDER — NITROFURANTOIN MONOHYD MACRO 100 MG PO CAPS
100.0000 mg | ORAL_CAPSULE | Freq: Two times a day (BID) | ORAL | 0 refills | Status: AC
Start: 2023-03-12 — End: 2023-03-17

## 2023-03-12 NOTE — Progress Notes (Signed)
Virtual Visit Consent   Maria Hancock, you are scheduled for a virtual visit with a [facility] provider today. Just as with appointments in the office, your consent must be obtained to participate. Your consent will be active for this visit and any virtual visit you may have with one of our providers in the next 365 days. If you have a MyChart account, a copy of this consent can be sent to you electronically.  As this is a virtual visit, video technology does not allow for your provider to perform a traditional examination. This may limit your provider's ability to fully assess your condition. If your provider identifies any concerns that need to be evaluated in person or the need to arrange testing (such as labs, EKG, etc.), we will make arrangements to do so. Although advances in technology are sophisticated, we cannot ensure that it will always work on either your end or our end. If the connection with a video visit is poor, the visit may have to be switched to a telephone visit. With either a video or telephone visit, we are not always able to ensure that we have a secure connection.  By engaging in this virtual visit, you consent to the provision of healthcare and authorize for your insurance to be billed (if applicable) for the services provided during this visit. Depending on your insurance coverage, you may receive a charge related to this service.  I need to obtain your verbal consent now. Are you willing to proceed with your visit today? Maria Hancock has provided verbal consent on 03/12/2023 for a virtual visit (video or telephone). Maria Rigg, NP  Date: 03/12/2023 11:33 AM  Virtual Visit via Video Note   I, Maria Hancock, connected with  Maria Hancock  ([id number], 1966/03/04) on 03/12/23 at 11:30 AM EDT by a video-enabled telemedicine application and verified that I am speaking with the correct person using two identifiers.  Location: Patient: Virtual Visit Location  Patient: Home Provider: Virtual Visit Location Provider: Home Office   I discussed the limitations of evaluation and management by telemedicine and the availability of in person appointments. The patient expressed understanding and agreed to proceed.    History of Present Illness: Maria Hancock is a 57 y.o. who identifies as a female who was assigned female at birth, and is being seen today for uti symptoms.    Maria Hancock is currently experiencing pelvic pressure, dysuria, urinary frequency and urgency. Symptoms started 2 days ago. She denies flank pain, fever or hematuria. Taking azo with no relief of symptoms  Problems:  Patient Active Problem List   Diagnosis Date Noted   High risk medication use 11/24/2022   MDD (major depressive disorder), recurrent episode, moderate (HCC) 07/13/2022   GAD (generalized anxiety disorder) 07/13/2022   Social anxiety disorder 07/13/2022   Bereavement 07/13/2022   At risk for prolonged QT interval syndrome 07/13/2022   Impingement syndrome of right shoulder 03/31/2022   Carpal tunnel syndrome, right upper limb 12/13/2021   Unspecified osteoarthritis, unspecified site 09/18/2021   Type 2 diabetes mellitus (HCC) 01/08/2021   Hypertensive disorder 01/08/2021   Neuropathy 01/08/2021   Atypical chest pain 05/31/2018   SOB (shortness of breath) 05/31/2018   Lightheadedness 05/31/2018   Palpitations 05/31/2018   Morbid obesity (HCC) 05/31/2018   Plantar fasciitis of left foot 01/26/2018   Major depression, recurrent, chronic (HCC) 12/02/2015   Osteoarthritis of left knee 02/26/2013    Class: Diagnosis of  Allergies: No Known Allergies Medications:  Current Outpatient Medications:    nitrofurantoin, macrocrystal-monohydrate, (MACROBID) 100 MG capsule, Take 1 capsule (100 mg total) by mouth 2 (two) times daily for 5 days., Disp: 10 capsule, Rfl: 0   amLODipine (NORVASC) 5 MG tablet, Take 5 mg by mouth daily., Disp: , Rfl:    atorvastatin  (LIPITOR) 40 MG tablet, Take 40 mg by mouth daily., Disp: , Rfl:    Brexpiprazole (REXULTI) 0.5 MG TABS, Take 1 tablet (0.5 mg total) by mouth at bedtime., Disp: 90 tablet, Rfl: 1   desloratadine (CLARINEX) 5 MG tablet, Take 1 tablet (5 mg total) by mouth daily., Disp: 30 tablet, Rfl: 2   desvenlafaxine (PRISTIQ) 100 MG 24 hr tablet, Take 100 mg by mouth daily., Disp: , Rfl:    fluticasone (FLONASE) 50 MCG/ACT nasal spray, Place 2 sprays into both nostrils daily., Disp: 18.2 mL, Rfl: 11   JANUMET 50-1000 MG tablet, Take 1 tablet by mouth 2 (two) times daily., Disp: , Rfl:    Potassium 99 MG TABS, Take by mouth., Disp: , Rfl:    pregabalin (LYRICA) 100 MG capsule, Take 100 mg by mouth 3 (three) times daily., Disp: , Rfl:    propranolol (INDERAL) 10 MG tablet, Take 1 tablet (10 mg total) by mouth 2 (two) times daily as needed. For severe anxiety only, Disp: 60 tablet, Rfl: 2  Observations/Objective: Patient is well-developed, well-nourished in no acute distress.  Resting comfortably at home.  Head is normocephalic, atraumatic.  No labored breathing.  Speech is clear and coherent with logical content.  Patient is alert and oriented at baseline.    Assessment and Plan: 1. UTI symptoms - nitrofurantoin, macrocrystal-monohydrate, (MACROBID) 100 MG capsule; Take 1 capsule (100 mg total) by mouth 2 (two) times daily for 5 days.  Dispense: 10 capsule; Refill: 0   Follow Up Instructions: I discussed the assessment and treatment plan with the patient. The patient was provided an opportunity to ask questions and all were answered. The patient agreed with the plan and demonstrated an understanding of the instructions.  A copy of instructions were sent to the patient via MyChart unless otherwise noted below.   The patient was advised to call back or seek an in-person evaluation if the symptoms worsen or if the condition fails to improve as anticipated.  Time:  I spent 11 minutes with the patient via  telehealth technology discussing the above problems/concerns.    Maria Rigg, NP

## 2023-03-12 NOTE — Patient Instructions (Signed)
  Dena Billet, thank you for joining Claiborne Rigg, NP for today's virtual visit.  While this provider is not your primary care provider (PCP), if your PCP is located in our provider database this encounter information will be shared with them immediately following your visit.   A [facility] MyChart account gives you access to today's visit and all your visits, tests, and labs performed at [facility] " click here if you don't have a [facility] MyChart account or go to [email or web address]  Consent: (Patient) Maria Hancock provided verbal consent for this virtual visit at the beginning of the encounter.  Current Medications:  Current Outpatient Medications:    nitrofurantoin, macrocrystal-monohydrate, (MACROBID) 100 MG capsule, Take 1 capsule (100 mg total) by mouth 2 (two) times daily for 5 days., Disp: 10 capsule, Rfl: 0   amLODipine (NORVASC) 5 MG tablet, Take 5 mg by mouth daily., Disp: , Rfl:    atorvastatin (LIPITOR) 40 MG tablet, Take 40 mg by mouth daily., Disp: , Rfl:    Brexpiprazole (REXULTI) 0.5 MG TABS, Take 1 tablet (0.5 mg total) by mouth at bedtime., Disp: 90 tablet, Rfl: 1   desloratadine (CLARINEX) 5 MG tablet, Take 1 tablet (5 mg total) by mouth daily., Disp: 30 tablet, Rfl: 2   desvenlafaxine (PRISTIQ) 100 MG 24 hr tablet, Take 100 mg by mouth daily., Disp: , Rfl:    fluticasone (FLONASE) 50 MCG/ACT nasal spray, Place 2 sprays into both nostrils daily., Disp: 18.2 mL, Rfl: 11   JANUMET 50-1000 MG tablet, Take 1 tablet by mouth 2 (two) times daily., Disp: , Rfl:    Potassium 99 MG TABS, Take by mouth., Disp: , Rfl:    pregabalin (LYRICA) 100 MG capsule, Take 100 mg by mouth 3 (three) times daily., Disp: , Rfl:    propranolol (INDERAL) 10 MG tablet, Take 1 tablet (10 mg total) by mouth 2 (two) times daily as needed. For severe anxiety only, Disp: 60 tablet, Rfl: 2   Medications ordered in this encounter:  Meds ordered this encounter   Medications   nitrofurantoin, macrocrystal-monohydrate, (MACROBID) 100 MG capsule    Sig: Take 1 capsule (100 mg total) by mouth 2 (two) times daily for 5 days.    Dispense:  10 capsule    Refill:  0    Order Specific Question:   Supervising Provider    Answer:   Merrilee Jansky [id number]     *If you need refills on other medications prior to your next appointment, please contact your pharmacy*  Follow-Up: Call back or seek an in-person evaluation if the symptoms worsen or if the condition fails to improve as anticipated.  [facility] [phone]   If you have been instructed to have an in-person evaluation today at a local Urgent Care facility, please use the link below. It will take you to a list of all of our available [facility], including address, phone number and hours of operation. Please do not delay care.  [facility]  If you or a family member do not have a primary care provider, use the link below to schedule a visit and establish care. When you choose a [facility] primary care physician or advanced practice provider, you gain a long-term partner in health. Find a Primary Care Provider  Learn more about [facility]'s in-office and virtual care options: [facility] - Get Care Now

## 2023-03-20 ENCOUNTER — Telehealth: Payer: Self-pay | Admitting: Psychiatry

## 2023-03-20 NOTE — Telephone Encounter (Signed)
We received lab results from [facility]. There are no significant abnormalities to warrant a medication change (prolactin 14.3). I will place the fax in Dr. Elvera Maria box for her review upon her return.

## 2023-04-04 NOTE — Telephone Encounter (Signed)
I have reviewed labs received from [facility] dated 03/09/2023-total cholesterol-lipid panel within normal limits Prolactin 14.3-within normal limits TSH-within normal limits at 3.650 RPR-nonreactive CMP-glucose elevated at 175 otherwise within normal limits.

## 2023-05-06 ENCOUNTER — Telehealth: Payer: Medicaid Other | Admitting: Family Medicine

## 2023-05-06 DIAGNOSIS — R399 Unspecified symptoms and signs involving the genitourinary system: Secondary | ICD-10-CM | POA: Diagnosis not present

## 2023-05-06 MED ORDER — NITROFURANTOIN MONOHYD MACRO 100 MG PO CAPS: 100.0000 mg | ORAL_CAPSULE | Freq: Two times a day (BID) | ORAL | 0 refills | Status: AC

## 2023-05-06 NOTE — Progress Notes (Signed)
Virtual Visit Consent   Maria Hancock, you are scheduled for a virtual visit with a [facility] provider today. Just as with appointments in the office, your consent must be obtained to participate. Your consent will be active for this visit and any virtual visit you may have with one of our providers in the next 365 days. If you have a MyChart account, a copy of this consent can be sent to you electronically.  As this is a virtual visit, video technology does not allow for your provider to perform a traditional examination. This may limit your provider's ability to fully assess your condition. If your provider identifies any concerns that need to be evaluated in person or the need to arrange testing (such as labs, EKG, etc.), we will make arrangements to do so. Although advances in technology are sophisticated, we cannot ensure that it will always work on either your end or our end. If the connection with a video visit is poor, the visit may have to be switched to a telephone visit. With either a video or telephone visit, we are not always able to ensure that we have a secure connection.  By engaging in this virtual visit, you consent to the provision of healthcare and authorize for your insurance to be billed (if applicable) for the services provided during this visit. Depending on your insurance coverage, you may receive a charge related to this service.  I need to obtain your verbal consent now. Are you willing to proceed with your visit today? Maria Hancock has provided verbal consent on 05/06/2023 for a virtual visit (video or telephone). Georgana Curio, FNP  Date: 05/06/2023 6:35 PM  Virtual Visit via Video Note   I, Georgana Curio, connected with  Maria Hancock  ([id number], 1966/05/05) on 05/06/23 at  6:30 PM EDT by a video-enabled telemedicine application and verified that I am speaking with the correct person using two identifiers.  Location: Patient: Virtual Visit Location Patient:  Home Provider: Virtual Visit Location Provider: Home Office   I discussed the limitations of evaluation and management by telemedicine and the availability of in person appointments. The patient expressed understanding and agreed to proceed.    History of Present Illness: Maria Hancock is a 57 y.o. who identifies as a female who was assigned female at birth, and is being seen today for burning on urination. No fever. No abd pain. Marland Kitchen  HPI: HPI  Problems:  Patient Active Problem List   Diagnosis Date Noted   High risk medication use 11/24/2022   MDD (major depressive disorder), recurrent episode, moderate (HCC) 07/13/2022   GAD (generalized anxiety disorder) 07/13/2022   Social anxiety disorder 07/13/2022   Bereavement 07/13/2022   At risk for prolonged QT interval syndrome 07/13/2022   Impingement syndrome of right shoulder 03/31/2022   Carpal tunnel syndrome, right upper limb 12/13/2021   Unspecified osteoarthritis, unspecified site 09/18/2021   Type 2 diabetes mellitus (HCC) 01/08/2021   Hypertensive disorder 01/08/2021   Neuropathy 01/08/2021   Atypical chest pain 05/31/2018   SOB (shortness of breath) 05/31/2018   Lightheadedness 05/31/2018   Palpitations 05/31/2018   Morbid obesity (HCC) 05/31/2018   Plantar fasciitis of left foot 01/26/2018   Major depression, recurrent, chronic (HCC) 12/02/2015   Osteoarthritis of left knee 02/26/2013    Class: Diagnosis of    Allergies: No Known Allergies Medications:  Current Outpatient Medications:    nitrofurantoin, macrocrystal-monohydrate, (MACROBID) 100 MG capsule, Take 1 capsule (100 mg total) by  mouth 2 (two) times daily for 7 days., Disp: 14 capsule, Rfl: 0   amLODipine (NORVASC) 5 MG tablet, Take 5 mg by mouth daily., Disp: , Rfl:    atorvastatin (LIPITOR) 40 MG tablet, Take 40 mg by mouth daily., Disp: , Rfl:    Brexpiprazole (REXULTI) 0.5 MG TABS, Take 1 tablet (0.5 mg total) by mouth at bedtime., Disp: 90 tablet, Rfl:  1   desloratadine (CLARINEX) 5 MG tablet, Take 1 tablet (5 mg total) by mouth daily., Disp: 30 tablet, Rfl: 2   desvenlafaxine (PRISTIQ) 100 MG 24 hr tablet, Take 100 mg by mouth daily., Disp: , Rfl:    fluticasone (FLONASE) 50 MCG/ACT nasal spray, Place 2 sprays into both nostrils daily., Disp: 18.2 mL, Rfl: 11   JANUMET 50-1000 MG tablet, Take 1 tablet by mouth 2 (two) times daily., Disp: , Rfl:    Potassium 99 MG TABS, Take by mouth., Disp: , Rfl:    pregabalin (LYRICA) 100 MG capsule, Take 100 mg by mouth 3 (three) times daily., Disp: , Rfl:    propranolol (INDERAL) 10 MG tablet, Take 1 tablet (10 mg total) by mouth 2 (two) times daily as needed. For severe anxiety only, Disp: 60 tablet, Rfl: 2  Observations/Objective: Patient is well-developed, well-nourished in no acute distress.  Resting comfortably  at home.  Head is normocephalic, atraumatic.  No labored breathing.  Speech is clear and coherent with logical content.  Patient is alert and oriented at baseline.    Assessment and Plan: 1. UTI symptoms  Increase fluids, preventative measures discussed, UC if sx persist or worsen.   Follow Up Instructions: I discussed the assessment and treatment plan with the patient. The patient was provided an opportunity to ask questions and all were answered. The patient agreed with the plan and demonstrated an understanding of the instructions.  A copy of instructions were sent to the patient via MyChart unless otherwise noted below.     The patient was advised to call back or seek an in-person evaluation if the symptoms worsen or if the condition fails to improve as anticipated.  Time:  I spent 8 minutes with the patient via telehealth technology discussing the above problems/concerns.    Georgana Curio, FNP

## 2023-05-06 NOTE — Patient Instructions (Signed)
Urinary Tract Infection, Adult  A urinary tract infection (UTI) is an infection of any part of the urinary tract. The urinary tract includes the kidneys, ureters, bladder, and urethra. These organs make, store, and get rid of urine in the body. An upper UTI affects the ureters and kidneys. A lower UTI affects the bladder and urethra. What are the causes? Most urinary tract infections are caused by bacteria in your genital area around your urethra, where urine leaves your body. These bacteria grow and cause inflammation of your urinary tract. What increases the risk? You are more likely to develop this condition if: You have a urinary catheter that stays in place. You are not able to control when you urinate or have a bowel movement (incontinence). You are female and you: Use a spermicide or diaphragm for birth control. Have low estrogen levels. Are pregnant. You have certain genes that increase your risk. You are sexually active. You take antibiotic medicines. You have a condition that causes your flow of urine to slow down, such as: An enlarged prostate, if you are female. Blockage in your urethra. A kidney stone. A nerve condition that affects your bladder control (neurogenic bladder). Not getting enough to drink, or not urinating often. You have certain medical conditions, such as: Diabetes. A weak disease-fighting system (immunesystem). Sickle cell disease. Gout. Spinal cord injury. What are the signs or symptoms? Symptoms of this condition include: Needing to urinate right away (urgency). Frequent urination. This may include small amounts of urine each time you urinate. Pain or burning with urination. Blood in the urine. Urine that smells bad or unusual. Trouble urinating. Cloudy urine. Vaginal discharge, if you are female. Pain in the abdomen or the lower back. You may also have: Vomiting or a decreased appetite. Confusion. Irritability or tiredness. A fever or  chills. Diarrhea. The first symptom in older adults may be confusion. In some cases, they may not have any symptoms until the infection has worsened. How is this diagnosed? This condition is diagnosed based on your medical history and a physical exam. You may also have other tests, including: Urine tests. Blood tests. Tests for STIs (sexually transmitted infections). If you have had more than one UTI, a cystoscopy or imaging studies may be done to determine the cause of the infections. How is this treated? Treatment for this condition includes: Antibiotic medicine. Over-the-counter medicines to treat discomfort. Drinking enough water to stay hydrated. If you have frequent infections or have other conditions such as a kidney stone, you may need to see a health care provider who specializes in the urinary tract (urologist). In rare cases, urinary tract infections can cause sepsis. Sepsis is a life-threatening condition that occurs when the body responds to an infection. Sepsis is treated in the hospital with IV antibiotics, fluids, and other medicines. Follow these instructions at home:  Medicines Take over-the-counter and prescription medicines only as told by your health care provider. If you were prescribed an antibiotic medicine, take it as told by your health care provider. Do not stop using the antibiotic even if you start to feel better. General instructions Make sure you: Empty your bladder often and completely. Do not hold urine for long periods of time. Empty your bladder after sex. Wipe from front to back after urinating or having a bowel movement if you are female. Use each tissue only one time when you wipe. Drink enough fluid to keep your urine pale yellow. Keep all follow-up visits. This is important. Contact a health   care provider if: Your symptoms do not get better after 1-2 days. Your symptoms go away and then return. Get help right away if: You have severe pain in  your back or your lower abdomen. You have a fever or chills. You have nausea or vomiting. Summary A urinary tract infection (UTI) is an infection of any part of the urinary tract, which includes the kidneys, ureters, bladder, and urethra. Most urinary tract infections are caused by bacteria in your genital area. Treatment for this condition often includes antibiotic medicines. If you were prescribed an antibiotic medicine, take it as told by your health care provider. Do not stop using the antibiotic even if you start to feel better. Keep all follow-up visits. This is important. This information is not intended to replace advice given to you by your health care provider. Make sure you discuss any questions you have with your health care provider. Document Revised: 04/26/2020 Document Reviewed: 05/01/2020 Elsevier Patient Education  2024 Elsevier Inc.  

## 2023-05-22 DIAGNOSIS — R600 Localized edema: Secondary | ICD-10-CM | POA: Insufficient documentation

## 2023-06-08 ENCOUNTER — Telehealth (INDEPENDENT_AMBULATORY_CARE_PROVIDER_SITE_OTHER): Payer: Self-pay | Admitting: Psychiatry

## 2023-06-08 DIAGNOSIS — F3342 Major depressive disorder, recurrent, in full remission: Secondary | ICD-10-CM

## 2023-06-08 NOTE — Progress Notes (Signed)
No response to call or text or video invite  

## 2023-07-04 ENCOUNTER — Encounter: Payer: Self-pay | Admitting: Psychiatry

## 2023-07-04 ENCOUNTER — Telehealth (INDEPENDENT_AMBULATORY_CARE_PROVIDER_SITE_OTHER): Payer: Medicaid Other | Admitting: Psychiatry

## 2023-07-04 DIAGNOSIS — F411 Generalized anxiety disorder: Secondary | ICD-10-CM | POA: Diagnosis not present

## 2023-07-04 DIAGNOSIS — Z634 Disappearance and death of family member: Secondary | ICD-10-CM | POA: Diagnosis not present

## 2023-07-04 DIAGNOSIS — F330 Major depressive disorder, recurrent, mild: Secondary | ICD-10-CM

## 2023-07-04 DIAGNOSIS — F329 Major depressive disorder, single episode, unspecified: Secondary | ICD-10-CM | POA: Diagnosis not present

## 2023-07-04 DIAGNOSIS — F4010 Social phobia, unspecified: Secondary | ICD-10-CM | POA: Diagnosis not present

## 2023-07-04 MED ORDER — PROPRANOLOL HCL 10 MG PO TABS: 10.0000 mg | ORAL_TABLET | Freq: Two times a day (BID) | ORAL | 2 refills | Status: DC | PRN

## 2023-07-04 MED ORDER — DESVENLAFAXINE SUCCINATE ER 100 MG PO TB24
100.0000 mg | ORAL_TABLET | Freq: Every day | ORAL | 1 refills | Status: DC
Start: 2023-07-04 — End: 2023-11-28

## 2023-07-04 MED ORDER — BREXPIPRAZOLE 0.25 MG PO TABS: 0.7500 mg | ORAL_TABLET | Freq: Every day | ORAL | 1 refills | Status: DC

## 2023-07-04 NOTE — Progress Notes (Signed)
Virtual Visit via Video Note  I connected with Maria Hancock on 07/04/23 at 11:00 AM EDT by a video enabled telemedicine application and verified that I am speaking with the correct person using two identifiers.  Location Provider Location : [facility] Patient Location : Work  Participants: Patient , Provider   I discussed the limitations of evaluation and management by telemedicine and the availability of in person appointments. The patient expressed understanding and agreed to proceed.    I discussed the assessment and treatment plan with the patient. The patient was provided an opportunity to ask questions and all were answered. The patient agreed with the plan and demonstrated an understanding of the instructions.   The patient was advised to call back or seek an in-person evaluation if the symptoms worsen or if the condition fails to improve as anticipated.   BH MD OP Progress Note  07/04/2023 2:33 PM Maria Hancock  MRN:  [id number]  Chief Complaint:  Chief Complaint  Patient presents with   Follow-up   Anxiety   Depression   Medication Refill   HPI: Maria Hancock is a 57 year old Caucasian female, married, employed, lives in [place], has a history of MDD, GAD, social anxiety, bereavement, diabetes mellitus, hyperlipidemia, hypertension, history of left knee arthroplasty, chronic back pain, arthralgia was evaluated by telemedicine today.  Patient today reports she is currently having worsening anxiety, depression symptoms.  She reports she recently got written up at work twice.  She reports that has been making her more anxious at work.  She is constantly worried about whether she will make another mistake or whether she will be written up again.  Patient reports she has been taking measures to make sure she does not make any careless mistakes at work however it has been overwhelming.  Patient became tearful when she discussed this.  Patient currently does not  have a therapist.  She however is motivated to establish care with a therapist.  Her previous therapist left the practice, Ms. Christina Hussami.   Patient reports sleep as improved.  She currently has a medication which she takes for overactive bladder and that has been beneficial.  Patient denies any suicidality, homicidality or perceptual disturbances.  She is compliant on Pristiq, Rexulti.  Denies side effects.  She has not used the propranolol since she is worried it might make her drowsy.  However agreeable to try it and let this provider know.  Patient reports it was the 1 year anniversary of her brother who passed away a year ago in 04/20/23.  That brought up a lot of memories although she is coping okay now.  Patient denies any other concerns today. Visit Diagnosis:    ICD-10-CM   1. MDD (major depressive disorder), recurrent episode, mild (HCC)  F33.0 brexpiprazole (REXULTI) 0.25 MG TABS tablet    desvenlafaxine (PRISTIQ) 100 MG 24 hr tablet    2. GAD (generalized anxiety disorder)  F41.1 brexpiprazole (REXULTI) 0.25 MG TABS tablet    propranolol (INDERAL) 10 MG tablet    desvenlafaxine (PRISTIQ) 100 MG 24 hr tablet    3. Social anxiety disorder  F40.10 propranolol (INDERAL) 10 MG tablet    4. Bereavement  Z63.4       Past Psychiatric History: I have reviewed past psychiatric history from progress note on 07/13/2022.  Past Medical History:  Past Medical History:  Diagnosis Date   Anxiety    Arthritis    "knees, back, hands" (02/26/2013)   Depression  Diabetes mellitus without complication (HCC)    GERD (gastroesophageal reflux disease)    OTC med as needed   Hyperlipidemia    Hypertension    Hypothyroidism    IBS (irritable bowel syndrome)    Menorrhagia    Migraine headache    Psoriasis     Past Surgical History:  Procedure Laterality Date   CARPAL TUNNEL RELEASE Right 12/22/2021   Procedure: RIGHT CARPAL TUNNEL RELEASE;  Surgeon: Marlyne Beards, MD;   Location: [facility];  Service: Orthopedics;  Laterality: Right;   HARDWARE REMOVAL Left 02/26/2013   Procedure: HARDWARE REMOVAL;  Surgeon: Cammy Copa, MD;  Location: [facility] OR;  Service: Orthopedics;  Laterality: Left;  Left Knee Removal of Hardware   JOINT REPLACEMENT     KNEE ARTHROPLASTY Left ?2001   "put hardware in" (02/26/2013)   KNEE ARTHROSCOPY Left    "I've had 2; 2nd one was 2013" (02/26/2013)   REPLACEMENT TOTAL KNEE Left 02/26/2013   "w/hardware removal" (02/26/2013)   TOTAL KNEE ARTHROPLASTY Left 02/26/2013   Procedure: TOTAL KNEE ARTHROPLASTY;  Surgeon: Cammy Copa, MD;  Location: [facility] OR;  Service: Orthopedics;  Laterality: Left;  Left Total Knee Arthroplasty    TUBAL LIGATION  1988    Family Psychiatric History: I have reviewed family psychiatric history from progress note on 07/13/2022.  Family History:  Family History  Problem Relation Age of Onset   Cirrhosis Father    Alcohol abuse Brother    Intellectual disability Maternal Aunt    Heart disease Maternal Grandmother    Intellectual disability Son    Drug abuse Son     Social History: I have reviewed social history from progress note on 07/13/2022. Social History   Socioeconomic History   Marital status: Married    Spouse name: Not on file   Number of children: 3   Years of education: Not on file   Highest education level: High school graduate  Occupational History   Not on file  Tobacco Use   Smoking status: Former    Current packs/day: 0.00    Average packs/day: 1 pack/day for 16.0 years (16.0 ttl pk-yrs)    Types: Cigarettes    Start date: 10/04/1979    Quit date: 10/04/1995    Years since quitting: 27.7   Smokeless tobacco: Never  Vaping Use   Vaping status: Never Used  Substance and Sexual Activity   Alcohol use: No   Drug use: Not Currently    Types: Marijuana   Sexual activity: Not Currently    Birth control/protection: Surgical    Comment: BTL  Other Topics Concern    Not on file  Social History Narrative   Not on file   Social Determinants of Health   Financial Resource Strain: Not on file  Food Insecurity: Not on file  Transportation Needs: Not on file  Physical Activity: Not on file  Stress: Not on file  Social Connections: Not on file    Allergies: No Known Allergies  Metabolic Disorder Labs: Lab Results  Component Value Date   HGBA1C 7.3 (H) 12/04/2015   MPG 163 12/04/2015   No results found for: "PROLACTIN" No results found for: "CHOL", "TRIG", "HDL", "CHOLHDL", "VLDL", "LDLCALC" Lab Results  Component Value Date   TSH 7.285 (H) 12/04/2015    Therapeutic Level Labs: No results found for: "LITHIUM" No results found for: "VALPROATE" No results found for: "CBMZ"  Current Medications: Current Outpatient Medications  Medication Sig Dispense Refill   brexpiprazole (  REXULTI) 0.25 MG TABS tablet Take 3 tablets (0.75 mg total) by mouth daily. 90 tablet 1   amLODipine (NORVASC) 5 MG tablet Take 5 mg by mouth daily.     atorvastatin (LIPITOR) 40 MG tablet Take 40 mg by mouth daily.     desloratadine (CLARINEX) 5 MG tablet Take 1 tablet (5 mg total) by mouth daily. 30 tablet 2   desvenlafaxine (PRISTIQ) 100 MG 24 hr tablet Take 1 tablet (100 mg total) by mouth daily. 90 tablet 1   fluticasone (FLONASE) 50 MCG/ACT nasal spray Place 2 sprays into both nostrils daily. 18.2 mL 11   JANUMET 50-1000 MG tablet Take 1 tablet by mouth 2 (two) times daily.     Potassium 99 MG TABS Take by mouth.     pregabalin (LYRICA) 100 MG capsule Take 100 mg by mouth 3 (three) times daily.     propranolol (INDERAL) 10 MG tablet Take 1 tablet (10 mg total) by mouth 2 (two) times daily as needed. For severe anxiety only 60 tablet 2   No current facility-administered medications for this visit.     Musculoskeletal: Strength & Muscle Tone:  UTA Gait & Station:  UTA Patient leans: N/A  Psychiatric Specialty Exam: Review of Systems   Psychiatric/Behavioral:  Positive for dysphoric mood. The patient is nervous/anxious.     There were no vitals taken for this visit.There is no height or weight on file to calculate BMI.  General Appearance: Fairly Groomed  Eye Contact:  Fair  Speech:  Clear and Coherent  Volume:  Normal  Mood:  Anxious and Depressed  Affect:  Congruent  Thought Process:  Goal Directed and Descriptions of Associations: Intact  Orientation:  Full (Time, Place, and Person)  Thought Content: Logical   Suicidal Thoughts:  No  Homicidal Thoughts:  No  Memory:  Immediate;   Fair Recent;   Fair Remote;   Fair  Judgement:  Fair  Insight:  Fair  Psychomotor Activity:  Normal  Concentration:  Concentration: Fair and Attention Span: Fair  Recall:  [employer] of Knowledge: Fair  Language: Fair  Akathisia:  No  Handed:  Right  AIMS (if indicated): not done  Assets:  Communication Skills Desire for Improvement Housing Social Support  ADL's:  Intact  Cognition: WNL  Sleep:   Improving   Screenings: AIMS    Flowsheet Row Video Visit from 09/27/2022 in [facility] Office Visit from 07/13/2022 in [facility]) [facility]  AIMS Total Score 0 0 0      AUDIT    Flowsheet Row [facility]  Alcohol Use Disorder Identification Test Final Score (AUDIT) 0      GAD-7    Flowsheet Row Counselor from 08/31/2022 in [facility]  Total GAD-7 Score 8      PHQ2-9    Flowsheet Row Counselor from 08/31/2022 in [facility] Video Visit from 08/16/2022 in [facility] Office Visit from 07/13/2022 in [facility]  PHQ-2  Total Score 3 4 4   PHQ-9 Total Score 17 11 15       Flowsheet Row Video Visit from 07/04/2023 in [facility] ED from 03/06/2023 in [facility] ED from 03/02/2023 in [facility])  C-SSRS RISK CATEGORY No Risk No Risk No Risk        Assessment and Plan: Maria Hancock is a 57 year old Caucasian female, married, employed, has a history of depression, anxiety, multiple medical problems, currently struggling with worsening mood symptoms, will benefit from the following medication management, reestablishing care with a therapist, plan as noted below.  Plan MDD-unstable Increase Rexulti to 0.75 mg p.o. nightly Continue Pristiq 100 mg p.o. daily Patient advised to reestablish care with a therapist, offered her resources.  GAD-unstable Propranolol 10 mg p.o. twice daily as needed-patient encouraged to use it, she has been noncompliant Pristiq 100 mg p.o. daily Patient advised to reestablish care with therapist.  Social anxiety disorder-improving Patient to establish care with a therapist Propranolol 10 mg p.o. twice daily as needed  Bereavement-improving Patient to continue CBT  Patient has been noncompliant with labs including lipid panel, prolactin level, hemoglobin A1c-patient to get it done.   Collaboration of Care: Collaboration of Care: Referral or follow-up with counselor/therapist AEB patient encouraged to establish care with therapist.  Patient/Guardian was advised Release of Information must be obtained prior to any record release in order to collaborate their care with an outside provider. Patient/Guardian was advised if they have not already done so to contact the registration department to sign all necessary forms in order for [place] to release information regarding their care.   Consent: Patient/Guardian gives verbal consent for treatment and assignment  of benefits for services provided during this visit. Patient/Guardian expressed understanding and agreed to proceed.  Follow-up in clinic in 4 weeks or sooner if needed in person.  This note was generated in part or whole with voice recognition software. Voice recognition is usually quite accurate but there are transcription errors that can and very often do occur. I apologize for any typographical errors that were not detected and corrected.     Jomarie Longs, MD 07/04/2023, 2:33 PM

## 2023-07-04 NOTE — Patient Instructions (Signed)
[email or web address]  [email or web address]  [email or web address] [facility] [address], [place], [place] [address]   [phone]  [facility] [email or web address] [address] [place], [place] [address]  [phone]  [facility], [facility] [email or web address] [address], [place], [place] [address]  [phone]   [facility] - [phone]  [facility] - [phone]  [facility] - [phone] counseling [phone]  [facility] [phone]   [email or web address] this clinician can offer telehealth and has a sliding scale option  [email or web address] this group also offers sliding scale rates and is based out of [place]  Dr. Liborio Nixon with the [facility] Group specializes in divorce  [employer] [facility] has interns who offer sliding scale rates and some of the full time clinicians do, as well. You complete their contact form on their website and the referrals coordinator will help to get connected to someone   Medicaid below :  [facility] [address] [place], [place] [address]  [phone]    [employer] [address] [place], [place] [address]  [phone]    [facility] [address] [place], [place] [address]  [phone]

## 2023-07-11 ENCOUNTER — Telehealth: Payer: Self-pay

## 2023-07-11 NOTE — Telephone Encounter (Signed)
called pharmacy to get insurance info.

## 2023-07-11 NOTE — Telephone Encounter (Signed)
pt called left message that she needed a prior auth on the rexulti

## 2023-07-11 NOTE — Telephone Encounter (Signed)
went online and submitted the prior auth - pending ?

## 2023-07-12 DIAGNOSIS — H5789 Other specified disorders of eye and adnexa: Secondary | ICD-10-CM | POA: Insufficient documentation

## 2023-07-12 NOTE — Telephone Encounter (Signed)
pharmacy was faxed the approval letter. - confirmed

## 2023-07-12 NOTE — Telephone Encounter (Signed)
prior auth approved PA # [id number] approved until 07-10-24

## 2023-08-08 ENCOUNTER — Ambulatory Visit: Payer: Medicaid Other | Admitting: Psychiatry

## 2023-09-04 ENCOUNTER — Other Ambulatory Visit: Payer: Self-pay | Admitting: Psychiatry

## 2023-09-04 DIAGNOSIS — F411 Generalized anxiety disorder: Secondary | ICD-10-CM

## 2023-09-04 DIAGNOSIS — F330 Major depressive disorder, recurrent, mild: Secondary | ICD-10-CM

## 2023-10-11 ENCOUNTER — Ambulatory Visit: Payer: Medicaid Other | Admitting: Psychiatry

## 2023-10-19 ENCOUNTER — Encounter: Payer: Self-pay | Admitting: Psychiatry

## 2023-10-19 ENCOUNTER — Telehealth (INDEPENDENT_AMBULATORY_CARE_PROVIDER_SITE_OTHER): Payer: Medicaid Other | Admitting: Psychiatry

## 2023-10-19 DIAGNOSIS — F4010 Social phobia, unspecified: Secondary | ICD-10-CM | POA: Diagnosis not present

## 2023-10-19 DIAGNOSIS — F411 Generalized anxiety disorder: Secondary | ICD-10-CM

## 2023-10-19 DIAGNOSIS — Z634 Disappearance and death of family member: Secondary | ICD-10-CM

## 2023-10-19 DIAGNOSIS — F3342 Major depressive disorder, recurrent, in full remission: Secondary | ICD-10-CM

## 2023-10-19 DIAGNOSIS — F330 Major depressive disorder, recurrent, mild: Secondary | ICD-10-CM

## 2023-10-19 MED ORDER — PROPRANOLOL HCL 10 MG PO TABS: 10.0000 mg | ORAL_TABLET | Freq: Two times a day (BID) | ORAL | 2 refills | Status: DC | PRN

## 2023-10-19 NOTE — Progress Notes (Signed)
Virtual Visit via Video Note  I connected with Maria Hancock on 10/19/23 at  2:30 PM EST by a video enabled telemedicine application and verified that I am speaking with the correct person using two identifiers.  Location Provider Location : [facility] Patient Location : Home  Participants: Patient , Provider   I discussed the limitations of evaluation and management by telemedicine and the availability of in person appointments. The patient expressed understanding and agreed to proceed.   I discussed the assessment and treatment plan with the patient. The patient was provided an opportunity to ask questions and all were answered. The patient agreed with the plan and demonstrated an understanding of the instructions.   The patient was advised to call back or seek an in-person evaluation if the symptoms worsen or if the condition fails to improve as anticipated.    [facility] MD OP Progress Note  10/20/2023 7:59 AM TAYLOUR BAIRE  MRN:  [id number]  Chief Complaint:  Chief Complaint  Patient presents with   Follow-up   Depression   Anxiety   Medication Refill   HPI: Maria Hancock is a 58 year old Caucasian female, married, employed, lives in [place] , has a history of MDD, GAD, social anxiety, bereavement, diabetes mellitus, hyperlipidemia, hypertension, history of left knee arthroplasty, chronic back pain, arthralgia was evaluated by telemedicine today.  The patient reports feeling "under the weather."  She reports gastrointestinal symptoms including diarrhea and indigestion, agrees to reach out to her primary care provider for evaluation and management.  She has been managing her mental health conditions with Rexulti, Pristiq, and propranolol as needed for anxiety. She reports that the medications have been effective and she has not experienced any recent episodes of depression or suicidal ideation.  She has not experienced any muscle spasms, rigidity, tremors, or stiffness  from the Rexulti.  The patient has been taking propranolol as needed for work-related stress and reports that it has been helpful. She has been sleeping well . She has been working reduced hours due to a cut in her work schedule.  She has not sought therapy for her mental health conditions, believing that her current medication regimen is sufficient. She has been encouraged to consider therapy for long-term coping strategies.  Patient currently denies any suicidality, homicidality or perceptual disturbances.  Visit Diagnosis:    ICD-10-CM   1. Recurrent major depressive disorder, in full remission (HCC)  F33.42     2. GAD (generalized anxiety disorder)  F41.1 propranolol (INDERAL) 10 MG tablet    3. Social anxiety disorder  F40.10 propranolol (INDERAL) 10 MG tablet    4. Bereavement  Z63.4       Past Psychiatric History: I have reviewed past psychiatric history from progress note on 07/13/2022.  Past Medical History:  Past Medical History:  Diagnosis Date   Anxiety    Arthritis    "knees, back, hands" (02/26/2013)   Depression    Diabetes mellitus without complication (HCC)    GERD (gastroesophageal reflux disease)    OTC med as needed   Hyperlipidemia    Hypertension    Hypothyroidism    IBS (irritable bowel syndrome)    Menorrhagia    Migraine headache    Psoriasis     Past Surgical History:  Procedure Laterality Date   CARPAL TUNNEL RELEASE Right 12/22/2021   Procedure: RIGHT CARPAL TUNNEL RELEASE;  Surgeon: Marlyne Beards, MD;  Location: [facility];  Service: Orthopedics;  Laterality: Right;   HARDWARE REMOVAL Left  02/26/2013   Procedure: HARDWARE REMOVAL;  Surgeon: Cammy Copa, MD;  [facility];  Service: Orthopedics;  Laterality: Left;  Left Knee Removal of Hardware   JOINT REPLACEMENT     KNEE ARTHROPLASTY Left ?2001   "put hardware in" (02/26/2013)   KNEE ARTHROSCOPY Left    "I've had 2; 2nd one was 2013" (02/26/2013)   REPLACEMENT  TOTAL KNEE Left 02/26/2013   "w/hardware removal" (02/26/2013)   TOTAL KNEE ARTHROPLASTY Left 02/26/2013   Procedure: TOTAL KNEE ARTHROPLASTY;  Surgeon: Cammy Copa, MD;  Location: [facility] OR;  Service: Orthopedics;  Laterality: Left;  Left Total Knee Arthroplasty    TUBAL LIGATION  1988    Family Psychiatric History: I have reviewed family psychiatric history from progress note on 07/13/2022.  Family History:  Family History  Problem Relation Age of Onset   Cirrhosis Father    Alcohol abuse Brother    Intellectual disability Maternal Aunt    Heart disease Maternal Grandmother    Intellectual disability Son    Drug abuse Son     Social History: I have reviewed social history from progress note on 07/13/2022. Social History   Socioeconomic History   Marital status: Married    Spouse name: Not on file   Number of children: 3   Years of education: Not on file   Highest education level: High school graduate  Occupational History   Not on file  Tobacco Use   Smoking status: Former    Current packs/day: 0.00    Average packs/day: 1 pack/day for 16.0 years (16.0 ttl pk-yrs)    Types: Cigarettes    Start date: 10/04/1979    Quit date: 10/04/1995    Years since quitting: 28.0   Smokeless tobacco: Never  Vaping Use   Vaping status: Never Used  Substance and Sexual Activity   Alcohol use: No   Drug use: Not Currently    Types: Marijuana   Sexual activity: Not Currently    Birth control/protection: Surgical    Comment: BTL  Other Topics Concern   Not on file  Social History Narrative   Not on file   Social Drivers of Health   Financial Resource Strain: Not on file  Food Insecurity: Not on file  Transportation Needs: Not on file  Physical Activity: Not on file  Stress: Not on file  Social Connections: Not on file    Allergies: No Known Allergies  Metabolic Disorder Labs: Lab Results  Component Value Date   HGBA1C 7.3 (H) 12/04/2015   MPG 163 12/04/2015   No  results found for: "PROLACTIN" No results found for: "CHOL", "TRIG", "HDL", "CHOLHDL", "VLDL", "LDLCALC" Lab Results  Component Value Date   TSH 7.285 (H) 12/04/2015    Therapeutic Level Labs: No results found for: "LITHIUM" No results found for: "VALPROATE" No results found for: "CBMZ"  Current Medications: Current Outpatient Medications  Medication Sig Dispense Refill   amLODipine (NORVASC) 5 MG tablet Take 5 mg by mouth daily.     atorvastatin (LIPITOR) 40 MG tablet Take 40 mg by mouth daily.     brexpiprazole (REXULTI) 0.25 MG TABS tablet TAKE 3 TABLETS BY MOUTH ONCE DAILY DOSE  INCREASE 90 tablet 1   desloratadine (CLARINEX) 5 MG tablet Take 1 tablet (5 mg total) by mouth daily. 30 tablet 2   desvenlafaxine (PRISTIQ) 100 MG 24 hr tablet Take 1 tablet (100 mg total) by mouth daily. 90 tablet 1   fluticasone (FLONASE) 50 MCG/ACT nasal spray Place  2 sprays into both nostrils daily. 18.2 mL 11   JANUMET 50-1000 MG tablet Take 1 tablet by mouth 2 (two) times daily.     Potassium 99 MG TABS Take by mouth.     pregabalin (LYRICA) 100 MG capsule Take 100 mg by mouth 3 (three) times daily.     propranolol (INDERAL) 10 MG tablet Take 1 tablet (10 mg total) by mouth 2 (two) times daily as needed. For severe anxiety only 60 tablet 2   No current facility-administered medications for this visit.     Musculoskeletal: Strength & Muscle Tone:  UTA Gait & Station:  Seated Patient leans: N/A  Psychiatric Specialty Exam: Review of Systems  Psychiatric/Behavioral: Negative.      There were no vitals taken for this visit.There is no height or weight on file to calculate BMI.  General Appearance: Fairly Groomed  Eye Contact:  Fair  Speech:  Normal Rate  Volume:  Normal  Mood:  Euthymic  Affect:  Congruent  Thought Process:  Goal Directed and Descriptions of Associations: Intact  Orientation:  Full (Time, Place, and Person)  Thought Content: Logical   Suicidal Thoughts:  No  Homicidal  Thoughts:  No  Memory:  Immediate;   Fair Recent;   Fair Remote;   Fair  Judgement:  Fair  Insight:  Fair  Psychomotor Activity:  Normal  Concentration:  Concentration: Fair and Attention Span: Fair  Recall:  [employer] of Knowledge: Fair  Language: Fair  Akathisia:  No  Handed:  Right  AIMS (if indicated): not done  Assets:  Desire for Improvement Housing Social Support Talents/Skills Transportation  ADL's:  Intact  Cognition: WNL  Sleep:  Fair   Screenings: AIMS    Flowsheet Row Video Visit from 09/27/2022 in [facility] Office Visit from [facility]/13/2022 in [facility]  AIMS Total Score 0 0 0      AUDIT    Flowsheet Row Ad[facility] (Discharged[facility]  Alcohol Use Disorder Identification Test Final Score (AUDIT) 0      GAD-7    Flowsheet Row Counselor from 08/31/2022 in [facility]  Total GAD-7 Score 8      PHQ2-9    Flowsheet Row Video Visit from 10/19/2023 in [facility] Counselor from 08/31/2022 in [facility] Video Visit from 08/16/2022 in [facility] Office Visit from 07/13/2022 in [facility]  PHQ-2 Total Score 0 3 4 4   PHQ-9 Total Score -- 17 11 15       Flowsheet Row Video Visit from 10/19/2023 in [facility] Video Visit from 07/04/2023 in [facility] ED from 03/06/2023 in [facility])  C-SSRS RISK CATEGORY No Risk No Risk No Risk        Assessment and Plan: KOHEN SENFT is a 58 year old Caucasian female,  married, employed, has a history of depression, anxiety, multiple medical problems currently struggling with GI problems although depression and anxiety is currently managed, discussed assessment and plan as noted below.  Major Depression in remission Reports no recent symptoms of depression, including sadness, hopelessness, or anhedonia. No recent suicidal ideation or attempts. Current medication regimen of Rexulti and Pristiq is  effective. Emphasized the importance of therapy for long-term coping strategies and behavioral tools. - Continue Rexulti 0.75 mg at bedtime - Continue Pristiq 100 mg daily - Follow up in three months  Generalized Anxiety Disorder-stable Propranolol effectively manages anxiety symptoms at work. No new symptoms or changes reported. Emphasized the importance of therapy for long-term coping strategies. - Continue propranolol 10 mg twice a day as needed. - Follow up in three months  Social Anxiety Disorder-stable No specific discussion of current symptoms or changes. Declined therapy, stating medication is effective. Emphasized the importance of therapy for long-term coping strategies and behavioral tools. - Continue current medication regimen - Discuss therapy options at next visit  Bereavement-stable No issues noted at this visit.   Follow-up - Schedule in-office visit on April 8th at 1 PM - Check MyChart for appointment confirmation.   Collaboration of Care: Collaboration of Care: Patient refused AEB patient declined referral for CBT  Patient/Guardian was advised Release of Information must be obtained prior to any record release in order to collaborate their care with an outside provider. Patient/Guardian was advised if they have not already done so to contact the registration department to sign all necessary forms in order for [place] to release information regarding their care.   Consent: Patient/Guardian gives verbal consent for treatment and assignment of  benefits for services provided during this visit. Patient/Guardian expressed understanding and agreed to proceed.   This note was generated in part or whole with voice recognition software. Voice recognition is usually quite accurate but there are transcription errors that can and very often do occur. I apologize for any typographical errors that were not detected and corrected.    Jomarie Longs, MD 10/20/2023, 7:59 AM

## 2023-10-20 DIAGNOSIS — F3342 Major depressive disorder, recurrent, in full remission: Secondary | ICD-10-CM | POA: Insufficient documentation

## 2023-11-04 ENCOUNTER — Other Ambulatory Visit: Payer: Self-pay | Admitting: Psychiatry

## 2023-11-04 DIAGNOSIS — F330 Major depressive disorder, recurrent, mild: Secondary | ICD-10-CM

## 2023-11-04 DIAGNOSIS — F411 Generalized anxiety disorder: Secondary | ICD-10-CM

## 2023-11-15 ENCOUNTER — Telehealth: Payer: Self-pay

## 2023-11-15 ENCOUNTER — Telehealth: Payer: Self-pay | Admitting: Psychiatry

## 2023-11-15 DIAGNOSIS — F4010 Social phobia, unspecified: Secondary | ICD-10-CM

## 2023-11-15 DIAGNOSIS — F330 Major depressive disorder, recurrent, mild: Secondary | ICD-10-CM

## 2023-11-15 DIAGNOSIS — F411 Generalized anxiety disorder: Secondary | ICD-10-CM

## 2023-11-15 NOTE — Telephone Encounter (Signed)
Patient has called today asking about samples or any help we can give her. Advised her that we had no samples in the office per the note in Epic. Told her that message was sent to [facility] regarding discount cards or anything else to help. She states she has been 2 days without medicine and is losing it. Any help appreciated.

## 2023-11-15 NOTE — Telephone Encounter (Signed)
Please check if she can have some kind of assistance or savings card or coupon.please let me know.

## 2023-11-15 NOTE — Telephone Encounter (Signed)
pt called states that she does not have any insurance at this time and she can not afford the rexulti. is there anything else she can take instead.

## 2023-11-15 NOTE — Telephone Encounter (Signed)
Aware . Working on getting her help with Rexulti . Nurse will be in touch with her .

## 2023-11-15 NOTE — Telephone Encounter (Signed)
Spoke to patient she stated that she is in the process of getting her medicaid back she submitted the paperwork she does not know how long it will take for her to get it back she will check the mail today to see if she has received it if not she will come by the office and get the patient assistance application tomorrow. Made patient aware of the samples that are available and when the rep is available to come by the office she voiced understanding.

## 2023-11-15 NOTE — Telephone Encounter (Signed)
Noted and thank you

## 2023-11-15 NOTE — Telephone Encounter (Signed)
Do we have any samples available or patient assistance or saving plan for her. Please assist .

## 2023-11-15 NOTE — Telephone Encounter (Signed)
there is no samples of that medication here.

## 2023-11-27 NOTE — Telephone Encounter (Signed)
 Please contact [facility] in [place] and cancel all remaining refills on Rexulti, Pristiq and propranolol sent by this provider. Please let me know once that is complete so I can send new prescription to her new pharmacy.  Please also update new pharmacy in the system after verifying with patient.

## 2023-11-28 MED ORDER — PROPRANOLOL HCL 10 MG PO TABS
10.0000 mg | ORAL_TABLET | Freq: Two times a day (BID) | ORAL | 0 refills | Status: AC | PRN
Start: 2023-11-28 — End: 2023-12-28

## 2023-11-28 MED ORDER — BREXPIPRAZOLE 0.25 MG PO TABS
0.7500 mg | ORAL_TABLET | Freq: Every day | ORAL | 0 refills | Status: AC
Start: 2023-11-28 — End: 2023-12-28

## 2023-11-28 MED ORDER — DESVENLAFAXINE SUCCINATE ER 100 MG PO TB24
100.0000 mg | ORAL_TABLET | Freq: Every day | ORAL | 0 refills | Status: AC
Start: 2023-11-28 — End: 2023-12-28

## 2023-11-28 NOTE — Telephone Encounter (Signed)
 I have sent all of her prescriptions for Pristiq, Rexulti and propranolol for 30 days supply to [facility].  She will need an appointment with [place] for future refills.  Please advise her to continue her care at her primary care provider and contact them for future refills if she is unable to return for an appointment.

## 2023-11-28 NOTE — Telephone Encounter (Signed)
 [facility] pharmacy spoke to [place] she stated that she would cancel all remaining refills for the Rexulti, Pristiq, and Propranolol

## 2023-11-29 NOTE — Telephone Encounter (Signed)
 Called patient to make aware of the message she stated that she is no longer eligible for medicaid and she is not getting as many hours at work as she was before but was told that she is making to much to qualify for medicaid she has checked on getting insurance with [employer] right now she is waiting for job to offer her insurance in April or May to see if she can afford that.

## 2023-11-29 NOTE — Telephone Encounter (Signed)
 Noted.

## 2023-12-04 ENCOUNTER — Telehealth: Payer: Self-pay

## 2023-12-04 NOTE — Telephone Encounter (Signed)
 Patient called to report that the Rexulti is going to be too expensive for her she was told by [facility] that there is not savings card for the medication she stated that she is going to go through her job for [employer] which she says is going to be very expensive for her, I asked patient if she still wanted to try to get the patient assistance for the medication she stated that she did not want to do that she just would like to have a different medication that she could afford. Please advise

## 2023-12-04 NOTE — Telephone Encounter (Signed)
 Since she will not be able to follow up with me I will not be able to make changes with her medications.  Please provide her community resources like [facility] or please advise her to follow up with primary care provider who she is currently seeing for further medication changes.  However if she is interested in medication changes she will need to schedule an appointment.

## 2023-12-05 NOTE — Telephone Encounter (Signed)
 Called patient to make aware of the message from provider she stated that the open enrollment starts today at her job but she is not sure when the insurance will start gave patient the number for [facility] she stated that she would reach out to them.

## 2024-01-09 ENCOUNTER — Ambulatory Visit: Payer: Medicaid Other | Admitting: Psychiatry

## 2024-01-15 DIAGNOSIS — R350 Frequency of micturition: Secondary | ICD-10-CM | POA: Insufficient documentation

## 2024-02-06 ENCOUNTER — Other Ambulatory Visit: Payer: Self-pay | Admitting: Psychiatry

## 2024-02-06 DIAGNOSIS — F411 Generalized anxiety disorder: Secondary | ICD-10-CM

## 2024-02-06 DIAGNOSIS — N644 Mastodynia: Secondary | ICD-10-CM | POA: Insufficient documentation

## 2024-02-06 DIAGNOSIS — F330 Major depressive disorder, recurrent, mild: Secondary | ICD-10-CM

## 2024-02-14 ENCOUNTER — Other Ambulatory Visit: Payer: Self-pay | Admitting: Family Medicine

## 2024-02-14 DIAGNOSIS — N644 Mastodynia: Secondary | ICD-10-CM

## 2024-02-14 DIAGNOSIS — Z1231 Encounter for screening mammogram for malignant neoplasm of breast: Secondary | ICD-10-CM

## 2024-02-20 DIAGNOSIS — F411 Generalized anxiety disorder: Secondary | ICD-10-CM | POA: Insufficient documentation

## 2024-03-01 ENCOUNTER — Ambulatory Visit
Admission: RE | Admit: 2024-03-01 | Discharge: 2024-03-01 | Disposition: A | Discharge status: Home / Self Care | Payer: Self-pay | Source: Ambulatory Visit | Attending: Family Medicine | Admitting: Family Medicine

## 2024-03-01 DIAGNOSIS — N644 Mastodynia: Secondary | ICD-10-CM | POA: Diagnosis present

## 2024-03-01 DIAGNOSIS — Z1231 Encounter for screening mammogram for malignant neoplasm of breast: Secondary | ICD-10-CM

## 2024-04-02 DIAGNOSIS — M7989 Other specified soft tissue disorders: Secondary | ICD-10-CM | POA: Insufficient documentation

## 2024-04-03 DIAGNOSIS — E611 Iron deficiency: Secondary | ICD-10-CM | POA: Insufficient documentation

## 2024-04-21 ENCOUNTER — Telehealth

## 2024-04-21 DIAGNOSIS — L0390 Cellulitis, unspecified: Secondary | ICD-10-CM

## 2024-04-21 MED ORDER — SULFAMETHOXAZOLE-TRIMETHOPRIM 800-160 MG PO TABS
1.0000 | ORAL_TABLET | Freq: Two times a day (BID) | ORAL | 0 refills | Status: DC
Start: 2024-04-21 — End: 2024-05-03

## 2024-04-21 NOTE — Progress Notes (Signed)
 Virtual Visit Consent   Maria Hancock, you are scheduled for a virtual visit with a [facility] provider today. Just as with appointments in the office, your consent must be obtained to participate. Your consent will be active for this visit and any virtual visit you may have with one of our providers in the next 365 days. If you have a MyChart account, a copy of this consent can be sent to you electronically.  As this is a virtual visit, video technology does not allow for your provider to perform a traditional examination. This may limit your provider's ability to fully assess your condition. If your provider identifies any concerns that need to be evaluated in person or the need to arrange testing (such as labs, EKG, etc.), we will make arrangements to do so. Although advances in technology are sophisticated, we cannot ensure that it will always work on either your end or our end. If the connection with a video visit is poor, the visit may have to be switched to a telephone visit. With either a video or telephone visit, we are not always able to ensure that we have a secure connection.  By engaging in this virtual visit, you consent to the provision of healthcare and authorize for your insurance to be billed (if applicable) for the services provided during this visit. Depending on your insurance coverage, you may receive a charge related to this service.  I need to obtain your verbal consent now. Are you willing to proceed with your visit today? Maria Hancock has provided verbal consent on 04/21/2024 for a virtual visit (video or telephone). Bari Learn, FNP  Date: 04/21/2024 8:33 AM   Virtual Visit via Video Note   I, Bari Learn, connected with  Maria Hancock  ([id number], December 05, 1965) on 04/21/24 at  8:30 AM EDT by a video-enabled telemedicine application and verified that I am speaking with the correct person using two identifiers.  Location: Patient: Virtual Visit Location  Patient: Home Provider: Virtual Visit Location Provider: Home Office   I discussed the limitations of evaluation and management by telemedicine and the availability of in person appointments. The patient expressed understanding and agreed to proceed.    History of Present Illness: Maria Hancock is a 58 y.o. who identifies as a female who was assigned female at birth, and is being seen today for left elbow swelling, erythemas, tenderness and warmth that he noticed a week ago. Reports it has become worse. States the swelling is improved. Denies any injury or hx of gout. Denies any fever.   She is a Research scientist (medical).   HPI: HPI  Problems:  Patient Active Problem List   Diagnosis Date Noted   Recurrent major depressive disorder, in full remission (HCC) 10/20/2023   High risk medication use 11/24/2022   MDD (major depressive disorder), recurrent episode, moderate (HCC) 07/13/2022   GAD (generalized anxiety disorder) 07/13/2022   Social anxiety disorder 07/13/2022   Bereavement 07/13/2022   At risk for prolonged QT interval syndrome 07/13/2022   Impingement syndrome of right shoulder 03/31/2022   Carpal tunnel syndrome, right upper limb 12/13/2021   Unspecified osteoarthritis, unspecified site 09/18/2021   Type 2 diabetes mellitus (HCC) 01/08/2021   Hypertensive disorder 01/08/2021   Neuropathy 01/08/2021   Atypical chest pain 05/31/2018   SOB (shortness of breath) 05/31/2018   Lightheadedness 05/31/2018   Palpitations 05/31/2018   Morbid obesity (HCC) 05/31/2018   Plantar fasciitis of left foot 01/26/2018   Major depression, recurrent,  chronic (HCC) 12/02/2015   Osteoarthritis of left knee 02/26/2013    Class: Diagnosis of    Allergies: No Known Allergies Medications:  Current Outpatient Medications:    sulfamethoxazole -trimethoprim  (BACTRIM  DS) 800-160 MG tablet, Take 1 tablet by mouth 2 (two) times daily., Disp: 14 tablet, Rfl: 0   amLODipine (NORVASC) 5 MG tablet, Take 5 mg by  mouth daily., Disp: , Rfl:    atorvastatin (LIPITOR) 40 MG tablet, Take 40 mg by mouth daily., Disp: , Rfl:    desvenlafaxine  (PRISTIQ ) 100 MG 24 hr tablet, Take 1 tablet (100 mg total) by mouth daily., Disp: 30 tablet, Rfl: 0   fluticasone  (FLONASE ) 50 MCG/ACT nasal spray, Place 2 sprays into both nostrils daily., Disp: 18.2 mL, Rfl: 11   JANUMET 50-1000 MG tablet, Take 1 tablet by mouth 2 (two) times daily., Disp: , Rfl:    Potassium 99 MG TABS, Take by mouth., Disp: , Rfl:    pregabalin (LYRICA) 100 MG capsule, Take 100 mg by mouth 3 (three) times daily., Disp: , Rfl:    propranolol  (INDERAL ) 10 MG tablet, Take 1 tablet (10 mg total) by mouth 2 (two) times daily as needed. For severe anxiety only, Disp: 60 tablet, Rfl: 0  Observations/Objective: Patient is well-developed, well-nourished in no acute distress.  Resting comfortably  at home.  Head is normocephalic, atraumatic.  No labored breathing.  Speech is clear and coherent with logical content.  Patient is alert and oriented at baseline.  Left elbow erythemas, extending up into arm Mild swelling, no bursitis noted  Assessment and Plan: 1. Cellulitis, unspecified cellulitis site (Primary) - sulfamethoxazole -trimethoprim  (BACTRIM  DS) 800-160 MG tablet; Take 1 tablet by mouth 2 (two) times daily.  Dispense: 14 tablet; Refill: 0  Mark area with a sharpie Start bactrim  BID  Follow up with PCP this week.  If area becomes increased redness, tenderness, fever, or pain go to ED.  Ice Rest Elevate Motrin  prn    Follow Up Instructions: I discussed the assessment and treatment plan with the patient. The patient was provided an opportunity to ask questions and all were answered. The patient agreed with the plan and demonstrated an understanding of the instructions.  A copy of instructions were sent to the patient via MyChart unless otherwise noted below.     The patient was advised to call back or seek an in-person evaluation if the  symptoms worsen or if the condition fails to improve as anticipated.    Bari Learn, FNP

## 2024-05-03 ENCOUNTER — Telehealth: Admitting: Physician Assistant

## 2024-05-03 DIAGNOSIS — L03114 Cellulitis of left upper limb: Secondary | ICD-10-CM

## 2024-05-03 DIAGNOSIS — M7022 Olecranon bursitis, left elbow: Secondary | ICD-10-CM | POA: Diagnosis not present

## 2024-05-03 MED ORDER — DOXYCYCLINE HYCLATE 100 MG PO TABS
100.0000 mg | ORAL_TABLET | Freq: Two times a day (BID) | ORAL | 0 refills | Status: DC
Start: 2024-05-03 — End: 2024-06-04

## 2024-05-03 MED ORDER — NAPROXEN 500 MG PO TABS
500.0000 mg | ORAL_TABLET | Freq: Two times a day (BID) | ORAL | 0 refills | Status: AC
Start: 2024-05-03 — End: ?

## 2024-05-03 NOTE — Patient Instructions (Signed)
 Maria Hancock, thank you for joining Delon CHRISTELLA Dickinson, PA-C for today's virtual visit.  While this provider is not your primary care provider (PCP), if your PCP is located in our provider database this encounter information will be shared with them immediately following your visit.   A [facility] MyChart account gives you access to today's visit and all your visits, tests, and labs performed at [facility]  click here if you don't have a [facility] MyChart account or go to [email or web address]  Consent: (Patient) Maria Hancock provided verbal consent for this virtual visit at the beginning of the encounter.  Current Medications:  Current Outpatient Medications:    doxycycline  (VIBRA -TABS) 100 MG tablet, Take 1 tablet (100 mg total) by mouth 2 (two) times daily., Disp: 20 tablet, Rfl: 0   naproxen  (NAPROSYN ) 500 MG tablet, Take 1 tablet (500 mg total) by mouth 2 (two) times daily with a meal., Disp: 30 tablet, Rfl: 0   amLODipine (NORVASC) 5 MG tablet, Take 5 mg by mouth daily., Disp: , Rfl:    atorvastatin (LIPITOR) 40 MG tablet, Take 40 mg by mouth daily., Disp: , Rfl:    desvenlafaxine  (PRISTIQ ) 100 MG 24 hr tablet, Take 1 tablet (100 mg total) by mouth daily., Disp: 30 tablet, Rfl: 0   fluticasone  (FLONASE ) 50 MCG/ACT nasal spray, Place 2 sprays into both nostrils daily., Disp: 18.2 mL, Rfl: 11   JANUMET 50-1000 MG tablet, Take 1 tablet by mouth 2 (two) times daily., Disp: , Rfl:    Potassium 99 MG TABS, Take by mouth., Disp: , Rfl:    pregabalin (LYRICA) 100 MG capsule, Take 100 mg by mouth 3 (three) times daily., Disp: , Rfl:    propranolol  (INDERAL ) 10 MG tablet, Take 1 tablet (10 mg total) by mouth 2 (two) times daily as needed. For severe anxiety only, Disp: 60 tablet, Rfl: 0   Medications ordered in this encounter:  Meds ordered this encounter  Medications   doxycycline  (VIBRA -TABS) 100 MG tablet    Sig: Take 1 tablet (100 mg total) by mouth 2  (two) times daily.    Dispense:  20 tablet    Refill:  0    Supervising Provider:   LAMPTEY, PHILIP O [[id number]]   naproxen  (NAPROSYN ) 500 MG tablet    Sig: Take 1 tablet (500 mg total) by mouth 2 (two) times daily with a meal.    Dispense:  30 tablet    Refill:  0    Supervising Provider:   BLAISE ALEENE KIDD [[id number]]     *If you need refills on other medications prior to your next appointment, please contact your pharmacy*  Follow-Up: Call back or seek an in-person evaluation if the symptoms worsen or if the condition fails to improve as anticipated.  [facility] [phone]  Other Instructions  Elbow Bursitis  Elbow (olecranon) bursitis is the swelling of the fluid-filled sac (bursa) between the tip of your elbow and your skin. A bursa acts as a cushion and protects the joint. If the bursa becomes irritated, it can fill with extra fluid and become inflamed. This condition is also called olecranon bursitis. What are the causes? This condition may be caused by: An elbow injury, such as falling onto the elbow. Leaning on hard surfaces for long periods of time. Infection from an injury that breaks the skin near the elbow. A bone growth (spur) that forms at the tip of the elbow. A medical condition that causes inflammation,  such as gout or rheumatoid arthritis. Sometimes the cause is not known. What are the signs or symptoms? The first sign of elbow bursitis is usually swelling at the tip of the elbow. This can grow to be about the size of a golf ball. Swelling may start suddenly or develop gradually. Other symptoms may include: Pain when bending or leaning on the elbow. Stiffness, or not being able to move the elbow normally. If bursitis is caused by an infection, you may have: Redness, warmth, and tenderness of the elbow. Drainage of pus from the swollen area over the elbow, if the skin breaks open. How is this diagnosed? This condition may be diagnosed based  on: Your symptoms and medical history. Any recent injuries you have had. A physical exam. X-rays to check for a bone spur or fracture. Draining fluid from the bursa to test it for infection. Blood tests to rule out gout or rheumatoid arthritis. How is this treated? Treatment for this condition depends on the cause. Treatment may include: Medicines. These may include: Over-the-counter medicines to relieve pain and inflammation. Antibiotic medicines if there is an infection. Injections of anti-inflammatory medicines (steroids). Draining fluid from the bursa. Wrapping your elbow with a bandage or compression arm sleeve. Wearing elbow pads. If these treatments do not help, you may need surgery to remove the bursa. Follow these instructions at home: Medicines Take over-the-counter and prescription medicines only as told by your health care provider. If you were prescribed an antibiotic medicine, take it as told by your health care provider. Do not stop taking the antibiotic even if you start to feel better. Managing pain, stiffness, and swelling     If directed, put ice on the affected area. To do this: Put ice in a plastic bag. Place a towel between your skin and the bag. Leave the ice on for 20 minutes, 2-3 times a day. Remove the ice if your skin turns bright red. This is very important. If you cannot feel pain, heat, or cold, you have a greater risk of damage to the area. If directed, apply heat to the affected area as often as told by your health care provider. Use the heat source that your health care provider recommends, such as a moist heat pack or a heating pad. Place a towel between your skin and the heat source. Leave the heat on 20-30 minutes. Remove the heat if your skin turns bright red. This is especially important if you are unable to feel pain, heat, or cold. You may have a greater risk of getting burned. If your bursitis is caused by an injury, rest your elbow and wear  your bandage or sleeve as told by your health care provider. Use elbow pads or elbow wraps to cushion your elbow and control swelling as needed. General instructions Avoid any activities that cause elbow pain. Ask your health care provider what activities are safe for you. Keep all follow-up visits. This is important. Contact a health care provider if: You have a fever. Your symptoms do not get better with treatment. You have pain or swelling that gets worse, or it goes away and then comes back. You have pus draining from your elbow. You have redness around the elbow area. Your elbow is warm to the touch. Get help right away if: You have trouble moving your arm, hand, or fingers. Summary Elbow (olecranon) bursitis is the swelling of the fluid-filled sac (bursa) between the tip of your elbow and your skin. Treatment for elbow  bursitis depends on the cause. It may include medicines to relieve pain and inflammation, antibiotic medicines to treat an infection, or draining fluid from your elbow. Contact a health care provider if your symptoms do not get better with treatment, or if your symptoms go away and then come back. This information is not intended to replace advice given to you by your health care provider. Make sure you discuss any questions you have with your health care provider. Document Revised: 09/14/2021 Document Reviewed: 09/14/2021 Elsevier Patient Education  2024 [employer]  Cellulitis, Adult  Cellulitis is a skin infection. The infected area is usually warm, red, swollen, and tender. It most commonly occurs on the lower body, such as the legs, feet, and toes, but this condition can occur on any part of the body. The infection can travel to the muscles, blood, and underlying tissue and become life-threatening without treatment. It is important to get medical treatment right away for this condition. What are the causes? Cellulitis is caused by bacteria. The bacteria enter  through a break in the skin, such as a cut, burn, insect or animal bite, open sore, or crack. What increases the risk? This condition is more likely to occur in people who: Have a weak body's defense system (immune system). Are older than 56 years old. Have diabetes. Have a type of long-term (chronic) liver disease (cirrhosis) or kidney disease. Are obese. Have a skin condition such as: An itchy rash, such as eczema or psoriasis. A fungal rash on the feet or in skinfolds. Blistering rashes, such as shingles or chickenpox. Slow movement of blood in the veins (venous stasis). Fluid buildup below the skin (edema). Have open wounds on the skin, such as cuts, puncture wounds, burns, bites, scrapes, tattoos, piercings, or wounds from surgery. Have had radiation therapy. Use IV drugs. What are the signs or symptoms? Symptoms of this condition include: Skin that looks red, purple, or slightly darker than your usual skin color. Streaks or spots on the skin. Swollen area of the skin. Tenderness or pain when an area of the skin is touched. Warm skin. Fever or chills. Blisters. Tiredness (fatigue). How is this diagnosed? This condition is diagnosed based on a medical history and physical exam. You may also have tests, including: Blood tests. Imaging tests. Tests on a sample of fluid taken from the wound (wound culture). How is this treated? Treatment for this condition may include: Medicines. These may include antibiotics or medicines to treat allergies (antihistamines). Rest. Applying cold or warm wet cloths (compresses) to the skin. If the condition is severe, you may need to stay in the hospital and get antibiotics through an IV. The infection usually starts to get better within 1-2 days of treatment. Follow these instructions at home: Medicines Take over-the-counter and prescription medicines only as told by your health care provider. If you were prescribed antibiotics, take them as  told by your provider. Do not stop using the antibiotic even if you start to feel better. General instructions Drink enough fluid to keep your pee (urine) pale yellow. Do not touch or rub the infected area. Raise (elevate) the infected area above the level of your heart while you are sitting or lying down. Return to your normal activities as told by your provider. Ask your provider what activities are safe for you. Apply warm or cold compresses to the affected area as told by your provider. Keep all follow-up visits. Your provider will need to make sure that a more serious infection  is not developing. Contact a health care provider if: You have a fever. Your symptoms do not improve within 1-2 days of starting treatment or you develop new symptoms. Your bone or joint underneath the infected area becomes painful after the skin has healed. Your infection returns in the same area or another area. Signs of this may include: You notice a swollen bump in the infected area. Your red area gets larger, turns dark in color, or becomes more painful. Drainage increases. Pus or a bad smell develops in your infected area. You have more pain. You feel ill and have muscle aches and weakness. You develop vomiting or diarrhea that will not go away. Get help right away if: You notice red streaks coming from the infected area. You notice the skin turns purple or black and falls off. This symptom may be an emergency. Get help right away. Call 911. Do not wait to see if the symptom will go away. Do not drive yourself to the hospital. This information is not intended to replace advice given to you by your health care provider. Make sure you discuss any questions you have with your health care provider. Document Revised: 05/17/2022 Document Reviewed: 05/17/2022 Elsevier Patient Education  2024 [employer].   If you have been instructed to have an in-person evaluation today at a local Urgent Care facility,  please use the link below. It will take you to a list of all of our available [facility], including address, phone number and hours of operation. Please do not delay care.  [facility]  If you or a family member do not have a primary care provider, use the link below to schedule a visit and establish care. When you choose a [facility] primary care physician or advanced practice provider, you gain a long-term partner in health. Find a Primary Care Provider  Learn more about [facility] in-office and virtual care options: [facility] - Get Care Now

## 2024-05-03 NOTE — Progress Notes (Signed)
 Virtual Visit Consent   Maria Hancock, you are scheduled for a virtual visit with a [facility] provider today. Just as with appointments in the office, your consent must be obtained to participate. Your consent will be active for this visit and any virtual visit you may have with one of our providers in the next 365 days. If you have a MyChart account, a copy of this consent can be sent to you electronically.  As this is a virtual visit, video technology does not allow for your provider to perform a traditional examination. This may limit your provider's ability to fully assess your condition. If your provider identifies any concerns that need to be evaluated in person or the need to arrange testing (such as labs, EKG, etc.), we will make arrangements to do so. Although advances in technology are sophisticated, we cannot ensure that it will always work on either your end or our end. If the connection with a video visit is poor, the visit may have to be switched to a telephone visit. With either a video or telephone visit, we are not always able to ensure that we have a secure connection.  By engaging in this virtual visit, you consent to the provision of healthcare and authorize for your insurance to be billed (if applicable) for the services provided during this visit. Depending on your insurance coverage, you may receive a charge related to this service.  I need to obtain your verbal consent now. Are you willing to proceed with your visit today? Maria Hancock has provided verbal consent on 05/03/2024 for a virtual visit (video or telephone). Delon CHRISTELLA Dickinson, PA-C  Date: 05/03/2024 6:13 PM   Virtual Visit via Video Note   I, Delon CHRISTELLA Dickinson, connected with  Maria Hancock  ([id number], [birth year]) on 05/03/24 at  5:00 PM EDT by a video-enabled telemedicine application and verified that I am speaking with the correct person using two identifiers.  Location: Patient: Virtual Visit  Location Patient: Home Provider: Virtual Visit Location Provider: Home Office   I discussed the limitations of evaluation and management by telemedicine and the availability of in person appointments. The patient expressed understanding and agreed to proceed.    History of Present Illness: Maria Hancock is a 58 y.o. who identifies as a female who was assigned female at birth, and is being seen today for cellulitis in left elbow. Was seen virtually on 04/21/24 and given Bactrim  x 7 days. Symptoms were improving but never completely resolved. Saw her PCP on 04/22/24 following. Then saw her PCP again and was changed to Keflex 500mg  QID x 7 days. Completed this dose two days ago.   Still having redness, warmth, itching that is improving some. Reports pain and stinging is the same as when started.    Problems:  Patient Active Problem List   Diagnosis Date Noted   Recurrent major depressive disorder, in full remission (HCC) 10/20/2023   High risk medication use 11/24/2022   MDD (major depressive disorder), recurrent episode, moderate (HCC) 07/13/2022   GAD (generalized anxiety disorder) 07/13/2022   Social anxiety disorder 07/13/2022   Bereavement 07/13/2022   At risk for prolonged QT interval syndrome 07/13/2022   Impingement syndrome of right shoulder 03/31/2022   Carpal tunnel syndrome, right upper limb 12/13/2021   Unspecified osteoarthritis, unspecified site 09/18/2021   Type 2 diabetes mellitus (HCC) 01/08/2021   Hypertensive disorder 01/08/2021   Neuropathy 01/08/2021   Atypical chest pain 05/31/2018   SOB (shortness  of breath) 05/31/2018   Lightheadedness 05/31/2018   Palpitations 05/31/2018   Morbid obesity (HCC) 05/31/2018   Plantar fasciitis of left foot 01/26/2018   Major depression, recurrent, chronic (HCC) 12/02/2015   Osteoarthritis of left knee 02/26/2013    Class: Diagnosis of    Allergies: No Known Allergies Medications:  Current Outpatient Medications:     doxycycline  (VIBRA -TABS) 100 MG tablet, Take 1 tablet (100 mg total) by mouth 2 (two) times daily., Disp: 20 tablet, Rfl: 0   naproxen  (NAPROSYN ) 500 MG tablet, Take 1 tablet (500 mg total) by mouth 2 (two) times daily with a meal., Disp: 30 tablet, Rfl: 0   amLODipine (NORVASC) 5 MG tablet, Take 5 mg by mouth daily., Disp: , Rfl:    atorvastatin (LIPITOR) 40 MG tablet, Take 40 mg by mouth daily., Disp: , Rfl:    desvenlafaxine  (PRISTIQ ) 100 MG 24 hr tablet, Take 1 tablet (100 mg total) by mouth daily., Disp: 30 tablet, Rfl: 0   fluticasone  (FLONASE ) 50 MCG/ACT nasal spray, Place 2 sprays into both nostrils daily., Disp: 18.2 mL, Rfl: 11   JANUMET 50-1000 MG tablet, Take 1 tablet by mouth 2 (two) times daily., Disp: , Rfl:    Potassium 99 MG TABS, Take by mouth., Disp: , Rfl:    pregabalin (LYRICA) 100 MG capsule, Take 100 mg by mouth 3 (three) times daily., Disp: , Rfl:    propranolol  (INDERAL ) 10 MG tablet, Take 1 tablet (10 mg total) by mouth 2 (two) times daily as needed. For severe anxiety only, Disp: 60 tablet, Rfl: 0  Observations/Objective: Patient is well-developed, well-nourished in no acute distress.  Resting comfortably at home.  Head is normocephalic, atraumatic.  No labored breathing.  Speech is clear and coherent with logical content.  Patient is alert and oriented at baseline.  Well demarcated, deep red erythematous, annular lesion noted on the left elbow just over the olecranon process; Patient reports warm to touch. No drainage noted. Patient reports it is squishy   Assessment and Plan: 1. Cellulitis of left elbow (Primary) - doxycycline  (VIBRA -TABS) 100 MG tablet; Take 1 tablet (100 mg total) by mouth 2 (two) times daily.  Dispense: 20 tablet; Refill: 0 - naproxen  (NAPROSYN ) 500 MG tablet; Take 1 tablet (500 mg total) by mouth 2 (two) times daily with a meal.  Dispense: 30 tablet; Refill: 0  2. Olecranon bursitis of left elbow - naproxen  (NAPROSYN ) 500 MG tablet; Take  1 tablet (500 mg total) by mouth 2 (two) times daily with a meal.  Dispense: 30 tablet; Refill: 0  - Failed Bactrim , Keflex was helping mildly but not completely - Will prescribe Doxycycline   - Naproxen  for possible olecranon bursitis component contributing - Cold compresses - Topical hydrocortisone or topical benadryl  are okay to use for itching - Seek in person evaluation if does not resolve or if worsens  Follow Up Instructions: I discussed the assessment and treatment plan with the patient. The patient was provided an opportunity to ask questions and all were answered. The patient agreed with the plan and demonstrated an understanding of the instructions.  A copy of instructions were sent to the patient via MyChart unless otherwise noted below.    The patient was advised to call back or seek an in-person evaluation if the symptoms worsen or if the condition fails to improve as anticipated.    Delon CHRISTELLA Dickinson, PA-C

## 2024-05-13 ENCOUNTER — Telehealth: Admitting: Physician Assistant

## 2024-05-13 DIAGNOSIS — M719 Bursopathy, unspecified: Secondary | ICD-10-CM | POA: Diagnosis not present

## 2024-05-13 DIAGNOSIS — L0390 Cellulitis, unspecified: Secondary | ICD-10-CM | POA: Insufficient documentation

## 2024-05-13 MED ORDER — CLINDAMYCIN HCL 150 MG PO CAPS: 300.0000 mg | ORAL_CAPSULE | Freq: Three times a day (TID) | ORAL | 0 refills | Status: AC

## 2024-05-13 MED ORDER — NAPROXEN 500 MG PO TABS: 500.0000 mg | ORAL_TABLET | Freq: Two times a day (BID) | ORAL | 0 refills | Status: AC

## 2024-05-13 NOTE — Progress Notes (Signed)
 Ms. Music,you are scheduled for a virtual visit with your provider today.    Just as we do with appointments in the office, we must obtain your consent to participate.  Your consent will be active for this visit and any virtual visit you may have with one of our providers in the next 365 days.    If you have a MyChart account, I can also send a copy of this consent to you electronically.  All virtual visits are billed to your insurance company just like a traditional visit in the office.  As this is a virtual visit, video technology does not allow for your provider to perform a traditional examination.  This may limit your provider's ability to fully assess your condition.  If your provider identifies any concerns that need to be evaluated in person or the need to arrange testing such as labs, EKG, etc, we will make arrangements to do so.    Although advances in technology are sophisticated, we cannot ensure that it will always work on either your end or our end.  If the connection with a video visit is poor, we may have to switch to a telephone visit.  With either a video or telephone visit, we are not always able to ensure that we have a secure connection.   I need to obtain your verbal consent now.   Are you willing to proceed with your visit today?   Maria Hancock has provided verbal consent on 05/13/2024 for a virtual visit (video or telephone).   Lynden GORMAN Snuffer, [place] 05/13/2024  7:58 PM   Date:  05/13/2024   ID:  Maria Hancock, DOB April 22, 1966, MRN [id number]  Patient Location: Home Provider Location: Home Office   Participants: Patient and Provider for Visit and Wrap up  Method of visit: Video  Location of Patient: Home Location of Provider: Home Office Consent was obtain for visit over the video. Services rendered by provider: Visit was performed via video  A video enabled telemedicine application was used and I verified that I am speaking with the correct person using  two identifiers.  PCP:  Cecily Katz, PA-C   Chief Complaint:  elbow redness  History of Present Illness:    Maria Hancock is a 58 y.o. female with history as stated below. Presents video telehealth for an acute care visit  Pt reports redness to the left elbow for the last month. She reports the swelling has improved since onset but she reports that it feels squishy like there is fluid in it. The area os warm to touch. Pain can bend the elbow without difficulty.   Denies fever  She has been on several antibiotics which have improved but not completely resolved   Past Medical, Surgical, Social History, Allergies, and Medications have been Reviewed.  Past Medical History:  Diagnosis Date   Anxiety    Arthritis    knees, back, hands (02/26/2013)   Depression    Diabetes mellitus without complication (HCC)    GERD (gastroesophageal reflux disease)    OTC med as needed   Hyperlipidemia    Hypertension    Hypothyroidism    IBS (irritable bowel syndrome)    Menorrhagia    Migraine headache    Psoriasis     No outpatient medications have been marked as taking for the 05/13/24 encounter (Video Visit) with [facility] PROVIDER.     Allergies:   Patient has no known allergies.   ROS See HPI for history of  present illness.  Physical Exam Musculoskeletal:     Comments: Erythema and swelling to the left elbow, normal rom of the left elbow              MDM: pt with elbow pain redness and swelling. Appears to have persistent cellulitis to the elbow. May also have concurrent bursitis. Doupt septic arthrits. No systemic sxs to warrant er eval. Advised that she will need to be seen by ortho and/or pcp for in person visit.    Tests Ordered: No orders of the defined types were placed in this encounter.   Medication Changes: No orders of the defined types were placed in this encounter.    Disposition:  Follow up  Signed, Lynden GORMAN Snuffer, PA-C  05/13/2024  7:58 PM

## 2024-05-13 NOTE — Patient Instructions (Signed)
  Katlynn A Paulino, thank you for joining [employer], PA-C for today's virtual visit.  While this provider is not your primary care provider (PCP), if your PCP is located in our provider database this encounter information will be shared with them immediately following your visit.   A [facility] MyChart account gives you access to today's visit and all your visits, tests, and labs performed at [facility]  click here if you don't have a [facility] MyChart account or go to [email or web address]  Consent: (Patient) Zebedee LABOR Fata provided verbal consent for this virtual visit at the beginning of the encounter.  Current Medications:  Current Outpatient Medications:    amLODipine (NORVASC) 5 MG tablet, Take 5 mg by mouth daily., Disp: , Rfl:    atorvastatin (LIPITOR) 40 MG tablet, Take 40 mg by mouth daily., Disp: , Rfl:    desvenlafaxine  (PRISTIQ ) 100 MG 24 hr tablet, Take 1 tablet (100 mg total) by mouth daily., Disp: 30 tablet, Rfl: 0   doxycycline  (VIBRA -TABS) 100 MG tablet, Take 1 tablet (100 mg total) by mouth 2 (two) times daily., Disp: 20 tablet, Rfl: 0   fluticasone  (FLONASE ) 50 MCG/ACT nasal spray, Place 2 sprays into both nostrils daily., Disp: 18.2 mL, Rfl: 11   JANUMET 50-1000 MG tablet, Take 1 tablet by mouth 2 (two) times daily., Disp: , Rfl:    naproxen  (NAPROSYN ) 500 MG tablet, Take 1 tablet (500 mg total) by mouth 2 (two) times daily with a meal., Disp: 30 tablet, Rfl: 0   Potassium 99 MG TABS, Take by mouth., Disp: , Rfl:    pregabalin (LYRICA) 100 MG capsule, Take 100 mg by mouth 3 (three) times daily., Disp: , Rfl:    propranolol  (INDERAL ) 10 MG tablet, Take 1 tablet (10 mg total) by mouth 2 (two) times daily as needed. For severe anxiety only, Disp: 60 tablet, Rfl: 0   Medications ordered in this encounter:  No orders of the defined types were placed in this encounter.    *If you need refills on other medications prior to your next  appointment, please contact your pharmacy*  Follow-Up: Call back or seek an in-person evaluation if the symptoms worsen or if the condition fails to improve as anticipated.  [facility] [phone]  Other Instructions Take the antibiotics and nsaids as directed  Make an appointment with your regular provider for an in person visit within 1 week. You will likely also need a referral to orthopedics   seek care sooner if your symptoms worsen or fail to improve.    If you have been instructed to have an in-person evaluation today at a local Urgent Care facility, please use the link below. It will take you to a list of all of our available [facility], including address, phone number and hours of operation. Please do not delay care.  [facility]  If you or a family member do not have a primary care provider, use the link below to schedule a visit and establish care. When you choose a [facility] primary care physician or advanced practice provider, you gain a long-term partner in health. Find a Primary Care Provider  Learn more about [facility]'s in-office and virtual care options: [facility] - Get Care Now

## 2024-05-14 ENCOUNTER — Encounter (INDEPENDENT_AMBULATORY_CARE_PROVIDER_SITE_OTHER): Payer: Self-pay | Admitting: Vascular Surgery

## 2024-06-04 ENCOUNTER — Ambulatory Visit (INDEPENDENT_AMBULATORY_CARE_PROVIDER_SITE_OTHER): Payer: Self-pay | Admitting: Vascular Surgery

## 2024-06-04 ENCOUNTER — Encounter (INDEPENDENT_AMBULATORY_CARE_PROVIDER_SITE_OTHER): Payer: Self-pay | Admitting: Vascular Surgery

## 2024-06-04 VITALS — BP 140/97 | HR 87 | Wt 323.5 lb

## 2024-06-04 DIAGNOSIS — M7989 Other specified soft tissue disorders: Secondary | ICD-10-CM | POA: Diagnosis not present

## 2024-06-04 DIAGNOSIS — I10 Essential (primary) hypertension: Secondary | ICD-10-CM | POA: Diagnosis not present

## 2024-06-04 DIAGNOSIS — L97211 Non-pressure chronic ulcer of right calf limited to breakdown of skin: Secondary | ICD-10-CM | POA: Insufficient documentation

## 2024-06-04 NOTE — Assessment & Plan Note (Signed)
 blood pressure control important in reducing the progression of atherosclerotic disease. On appropriate oral medications.

## 2024-06-04 NOTE — Assessment & Plan Note (Signed)
 Contributes to swelling and weight loss would help improve the swelling.

## 2024-06-04 NOTE — Assessment & Plan Note (Signed)
 Recommend:  I have had a long discussion with the patient regarding swelling and why it  causes symptoms.  Due to the skin breakdown and severity of the swelling, a 3 layer Unna boot was placed today on both lower extremities.  This will be changed weekly.  When she comes out of the Unna boots and the wounds have healed, compression socks will be necessary at that point.  We could try several different options for individuals with difficulty getting them on and off, but some sort of compression garment is going to be essential to keep her swelling under control.  In addition, behavioral modification will be initiated.  This will include frequent elevation, use of over the counter pain medications and exercise such as walking.  Consideration for a lymph pump will also be made based upon the effectiveness of conservative therapy.  This would help to improve the edema control and prevent sequela such as ulcers and infections   Patient should undergo duplex ultrasound of the venous system to ensure that DVT or reflux is not present.  The patient will follow-up with me after the ultrasound.

## 2024-06-04 NOTE — Assessment & Plan Note (Signed)
 Bilateral small ulcerations due to the extreme swelling and skin breakdown.  3 layer Unna boot's were placed bilaterally today and will be changed weekly.

## 2024-06-04 NOTE — Progress Notes (Signed)
 Patient ID: Maria Hancock, female   DOB: 02-Feb-1966, 58 y.o.   MRN: [id number]  No chief complaint on file.   HPI Maria Hancock is a 58 y.o. female.  I am asked to see the patient by Morene Potash for evaluation of leg swelling and skin weeping with ulceration of the lower extremities.  The patient has had problems with swelling in her legs for many years.  Due to issues with her hands, she has had a hard time getting compression socks on and off.  This is now more problematic as her swelling is continued to progress.  She has developed ulceration with small circular ulcers on the lower anterior legs bilaterally that have been weeping fluid.  These are smaller than they were, but they are still persistent.  They are worse on the right leg than the left.  The right leg is more painful than the left leg and more swollen than the left leg in general.  She denies a known history of DVT or superficial thrombophlebitis.  No fevers or chills or signs of systemic infection.  She also discusses that as her legs have become more swollen, she has become less active and had more difficulty walking due to the pain.  This has led to a worsening of the swelling.   Past Medical History:  Diagnosis Date   Anxiety    Arthritis    knees, back, hands (02/26/2013)   Depression    Diabetes mellitus without complication (HCC)    GERD (gastroesophageal reflux disease)    OTC med as needed   Hyperlipidemia    Hypertension    Hypothyroidism    IBS (irritable bowel syndrome)    Menorrhagia    Migraine headache    Psoriasis     Past Surgical History:  Procedure Laterality Date   CARPAL TUNNEL RELEASE Right 12/22/2021   Procedure: RIGHT CARPAL TUNNEL RELEASE;  Surgeon: Romona Harari, MD;  Location: [facility];  Service: Orthopedics;  Laterality: Right;   HARDWARE REMOVAL Left 02/26/2013   Procedure: HARDWARE REMOVAL;  Surgeon: Cordella Glendia Hutchinson, MD;  Location[facility] OR;  Service:  Orthopedics;  Laterality: Left;  Left Knee Removal of Hardware   JOINT REPLACEMENT     KNEE ARTHROPLASTY Left ?2001   put hardware in (02/26/2013)   KNEE ARTHROSCOPY Left    I've had 2; 2nd one was 2013 (02/26/2013)   REPLACEMENT TOTAL KNEE Left 02/26/2013   w/hardware removal (02/26/2013)   TOTAL KNEE ARTHROPLASTY Left 02/26/2013   Procedure: TOTAL KNEE ARTHROPLASTY;  Surgeon: Cordella Glendia Hutchinson, MD;  Location: [facility] OR;  Service: Orthopedics;  Laterality: Left;  Left Total Knee Arthroplasty    TUBAL LIGATION  1988     Family History  Problem Relation Age of Onset   Cirrhosis Father    Alcohol abuse Brother    Intellectual disability Maternal Aunt    Heart disease Maternal Grandmother    Intellectual disability Son    Drug abuse Son       Social History   Tobacco Use   Smoking status: Former    Current packs/day: 0.00    Average packs/day: 1 pack/day for 16.0 years (16.0 ttl pk-yrs)    Types: Cigarettes    Start date: 10/04/1979    Quit date: 10/04/1995    Years since quitting: 28.6   Smokeless tobacco: Never  Vaping Use   Vaping status: Never Used  Substance Use Topics   Alcohol use: No  Drug use: Not Currently    Types: Marijuana     No Known Allergies  Current Outpatient Medications  Medication Sig Dispense Refill   atorvastatin (LIPITOR) 40 MG tablet Take 40 mg by mouth daily.     desvenlafaxine  (PRISTIQ ) 100 MG 24 hr tablet Take 1 tablet (100 mg total) by mouth daily. 30 tablet 0   fluticasone  (FLONASE ) 50 MCG/ACT nasal spray Place 2 sprays into both nostrils daily. 18.2 mL 11   naproxen  (NAPROSYN ) 500 MG tablet Take 1 tablet (500 mg total) by mouth 2 (two) times daily with a meal. 30 tablet 0   Potassium 99 MG TABS Take by mouth.     pregabalin (LYRICA) 100 MG capsule Take 100 mg by mouth 3 (three) times daily.     propranolol  (INDERAL ) 10 MG tablet Take 1 tablet (10 mg total) by mouth 2 (two) times daily as needed. For severe anxiety only 60 tablet 0   No  current facility-administered medications for this visit.      REVIEW OF SYSTEMS (Negative unless checked)  Constitutional: [] Weight loss  [] Fever  [] Chills Cardiac: [] Chest pain   [] Chest pressure   [] Palpitations   [] Shortness of breath when laying flat   [] Shortness of breath at rest   [] Shortness of breath with exertion. Vascular:  [] Pain in legs with walking   [] Pain in legs at rest   [] Pain in legs when laying flat   [] Claudication   [] Pain in feet when walking  [] Pain in feet at rest  [] Pain in feet when laying flat   [] History of DVT   [] Phlebitis   [x] Swelling in legs   [] Varicose veins   [x] Non-healing ulcers Pulmonary:   [] Uses home oxygen   [] Productive cough   [] Hemoptysis   [] Wheeze  [] COPD   [] Asthma Neurologic:  [] Dizziness  [] Blackouts   [] Seizures   [] History of stroke   [] History of TIA  [] Aphasia   [] Temporary blindness   [] Dysphagia   [] Weakness or numbness in arms   [] Weakness or numbness in legs Musculoskeletal:  [x] Arthritis   [] Joint swelling   [] Joint pain   [] Low back pain Hematologic:  [] Easy bruising  [] Easy bleeding   [] Hypercoagulable state   [] Anemic  [] Hepatitis Gastrointestinal:  [] Blood in stool   [] Vomiting blood  [x] Gastroesophageal reflux/heartburn   [] Abdominal pain Genitourinary:  [] Chronic kidney disease   [] Difficult urination  [] Frequent urination  [] Burning with urination   [] Hematuria Skin:  [] Rashes   [x] Ulcers   [x] Wounds Psychological:  [] History of anxiety   []  History of major depression.    Physical Exam BP (!) 140/97   Pulse 87   Wt (!) 323 lb 8 oz (146.7 kg)   BMI 55.53 kg/m  Gen:  WD/WN, NAD. obese Head: [place]/AT, No temporalis wasting. Ear/Nose/Throat: Hearing grossly intact, nares w/o erythema or drainage, oropharynx w/o Erythema/Exudate Eyes: Conjunctiva clear, sclera non-icteric  Neck: trachea midline.  No JVD.  Pulmonary:  Good air movement, respirations not labored, no use of accessory muscles  Cardiac: RRR, no JVD Vascular:   Vessel Right Left  Radial Palpable Palpable                          PT NP NP  DP 1+ 1+   Gastrointestinal:. No masses, surgical incisions, or scars. Musculoskeletal: M/S 5/5 throughout.  Extremities without ischemic changes.  No deformity or atrophy.  Small circular wounds on the anterior aspect of both lower legs with 2 on the  right and 1 on the left.  No erythema.  Serous drainage.  2-3+ bilateral lower extremity edema. Neurologic: Sensation grossly intact in extremities.  Symmetrical.  Speech is fluent. Motor exam as listed above. Psychiatric: Judgment intact, Mood & affect appropriate for pt's clinical situation. Dermatologic: Lower leg wounds as above.    Radiology No results found.  Labs No results found for this or any previous visit (from the past 2160 hours).  Assessment/Plan:  Swelling of limb Recommend:  I have had a long discussion with the patient regarding swelling and why it  causes symptoms.  Due to the skin breakdown and severity of the swelling, a 3 layer Unna boot was placed today on both lower extremities.  This will be changed weekly.  When she comes out of the Unna boots and the wounds have healed, compression socks will be necessary at that point.  We could try several different options for individuals with difficulty getting them on and off, but some sort of compression garment is going to be essential to keep her swelling under control.  In addition, behavioral modification will be initiated.  This will include frequent elevation, use of over the counter pain medications and exercise such as walking.  Consideration for a lymph pump will also be made based upon the effectiveness of conservative therapy.  This would help to improve the edema control and prevent sequela such as ulcers and infections   Patient should undergo duplex ultrasound of the venous system to ensure that DVT or reflux is not present.  The patient will follow-up with me after the  ultrasound.   Morbid obesity (HCC) Contributes to swelling and weight loss would help improve the swelling.  Hypertensive disorder blood pressure control important in reducing the progression of atherosclerotic disease. On appropriate oral medications.   Lower limb ulcer, calf, right, limited to breakdown of skin (HCC) Bilateral small ulcerations due to the extreme swelling and skin breakdown.  3 layer Unna boot's were placed bilaterally today and will be changed weekly.      Selinda Gu 06/04/2024, 12:51 PM   This note was created with Dragon medical transcription system.  Any errors from dictation are unintentional.

## 2024-06-10 ENCOUNTER — Ambulatory Visit: Admission: EM | Admit: 2024-06-10 | Discharge: 2024-06-10 | Disposition: A | Discharge status: Home / Self Care

## 2024-06-10 ENCOUNTER — Ambulatory Visit (INDEPENDENT_AMBULATORY_CARE_PROVIDER_SITE_OTHER)

## 2024-06-10 DIAGNOSIS — M25552 Pain in left hip: Secondary | ICD-10-CM

## 2024-06-10 DIAGNOSIS — E1140 Type 2 diabetes mellitus with diabetic neuropathy, unspecified: Secondary | ICD-10-CM | POA: Diagnosis not present

## 2024-06-10 MED ORDER — PREDNISONE 20 MG PO TABS: 40.0000 mg | ORAL_TABLET | Freq: Every day | ORAL | 0 refills | Status: AC

## 2024-06-10 NOTE — ED Triage Notes (Signed)
 I am not sure if I pulled something in my groin area or my left hip is out of place. I am having sever pain and almost unable to move, it started Thursday evening, I groom dogs for a living so nothing obvious as far as injury, jut a lot of repetitive motions normally.

## 2024-06-10 NOTE — ED Provider Notes (Signed)
 [name]    CSN: [id number] Arrival date & time: 06/10/24  1711      History   Chief Complaint Chief Complaint  Patient presents with   Pain    HPI Maria Hancock is a 58 y.o. female.   Patient here today for evaluation of pain to her left hip and groin area.  She states she is having severe pain with walking and feels unable to move it.  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  She does not report any numbness or tingling.  The history is provided by the patient.    Past Medical History:  Diagnosis Date   Anxiety    Arthritis    knees, back, hands (02/26/2013)   Atypical chest pain 05/31/2018   Depression    Diabetes mellitus without complication (HCC)    GERD (gastroesophageal reflux disease)    OTC med as needed   Hyperlipidemia    Hypertension    Hypothyroidism    IBS (irritable bowel syndrome)    Lightheadedness 05/31/2018   Menorrhagia    Migraine headache    Palpitations 05/31/2018   Plantar fasciitis of left foot 01/26/2018   Psoriasis    SOB (shortness of breath) 05/31/2018    Patient Active Problem List   Diagnosis Date Noted   Lower limb ulcer, calf, right, limited to breakdown of skin (HCC) 06/04/2024   Cellulitis 05/13/2024   Iron deficiency 04/03/2024   Swelling of limb 04/02/2024   Generalized anxiety disorder 02/20/2024   Breast pain, left 02/06/2024   Increased frequency of urination 01/15/2024   Recurrent major depressive disorder, in full remission (HCC) 10/20/2023   Eye discharge 07/12/2023   Pedal edema 05/22/2023   Other long term (current) drug therapy 03/08/2023   Mouth ulcers 02/22/2023   Acute cough 12/07/2022   High risk medication use 11/24/2022   MDD (major depressive disorder), recurrent episode, moderate (HCC) 07/13/2022   GAD (generalized anxiety disorder) 07/13/2022   Social anxiety disorder 07/13/2022   Bereavement 07/13/2022   At risk for  prolonged QT interval syndrome 07/13/2022   Shortness of breath 05/11/2022   Impingement syndrome of right shoulder 03/31/2022   Neuropathy of lower extremity 03/03/2022   Carpal tunnel syndrome, right upper limb 12/13/2021   Chronic low back pain 10/27/2021   Hx of total knee replacement, left 10/27/2021   Routine history and physical examination of adult 10/27/2021   Unspecified osteoarthritis, unspecified site 09/18/2021   Hypothyroidism, unspecified 07/01/2021   Hyperlipidemia 07/01/2021   Uterine bleeding 05/19/2021   Type 2 diabetes mellitus (HCC) 01/08/2021   Hypertensive disorder 01/08/2021   Neuropathy 01/08/2021   Other chest pain 10/09/2020   Joint pain 12/14/2019   Morbid obesity (HCC) 05/31/2018   Abnormal results of thyroid function studies 05/18/2017   Daytime somnolence 05/18/2017   Dizziness 05/16/2017   Essential (primary) hypertension 01/27/2017   Menstrual disorder 10/27/2016   Diabetic peripheral neuropathy associated with type 2 diabetes mellitus (HCC) 08/15/2016   Psoriasis 08/15/2016   Major depressive disorder, recurrent, moderate (HCC) 08/15/2016   Major depression, recurrent, chronic (HCC) 12/02/2015   Osteoarthritis of left knee 02/26/2013    Class: Diagnosis of    Past Surgical History:  Procedure Laterality Date   CARPAL TUNNEL RELEASE Right 12/22/2021   Procedure: RIGHT CARPAL TUNNEL RELEASE;  Surgeon: Romona Harari, MD;  Location: [facility];  Service: Orthopedics;  Laterality: Right;   HARDWARE  REMOVAL Left 02/26/2013   Procedure: HARDWARE REMOVAL;  Surgeon: Cordella Glendia Hutchinson, MD;  Location: [facility] OR;  Service: Orthopedics;  Laterality: Left;  Left Knee Removal of Hardware   JOINT REPLACEMENT     KNEE ARTHROPLASTY Left ?2001   put hardware in (02/26/2013)   KNEE ARTHROSCOPY Left    I've had 2; 2nd one was 2013 (02/26/2013)   REPLACEMENT TOTAL KNEE Left 02/26/2013   w/hardware removal (02/26/2013)   TOTAL KNEE ARTHROPLASTY  Left 02/26/2013   Procedure: TOTAL KNEE ARTHROPLASTY;  Surgeon: Cordella Glendia Hutchinson, MD;  Location: [facility] OR;  Service: Orthopedics;  Laterality: Left;  Left Total Knee Arthroplasty    TUBAL LIGATION  1988    OB History   No obstetric history on file.      Home Medications    Prior to Admission medications   Medication Sig Start Date End Date Taking? Authorizing Provider  ascorbic acid (VITAMIN C) 500 MG tablet Take 500 mg by mouth daily. 04/04/24  Yes [provider]  Blood Glucose Monitoring Suppl (ACCU-CHEK GUIDE) w/Device KIT ACCU-CHEK GUIDE w/Device KIT 12/08/22  Yes [provider]  cephALEXin (KEFLEX) 500 MG capsule CEPHALEXIN 500 MG CAPS 04/24/24  Yes [provider]  Ferrous Sulfate (IRON) 325 (65 Fe) MG TABS TAKE 1 TABLET BY MOUTH ONCE DAILY FOR IRON DEFICIENCY 04/04/24  Yes [provider]  atorvastatin (LIPITOR) 40 MG tablet Take 40 mg by mouth daily.    [provider]  desvenlafaxine  (PRISTIQ ) 100 MG 24 hr tablet Take 1 tablet (100 mg total) by mouth daily. 11/28/23 12/28/23  Eappen, Saramma, MD  fluticasone  (FLONASE ) 50 MCG/ACT nasal spray Place 2 sprays into both nostrils daily. 05/27/22 05/27/23  Isadora Hose, MD  naproxen  (NAPROSYN ) 500 MG tablet Take 1 tablet (500 mg total) by mouth 2 (two) times daily with a meal. 05/03/24   Burnette, Delon HERO, PA-C  Potassium 99 MG TABS Take by mouth.    [provider]  pregabalin (LYRICA) 100 MG capsule Take 100 mg by mouth 3 (three) times daily. 06/07/22   [provider]  propranolol  (INDERAL ) 10 MG tablet Take 1 tablet (10 mg total) by mouth 2 (two) times daily as needed. For severe anxiety only 11/28/23 12/28/23  Eappen, Saramma, MD  FLUoxetine (PROZAC) 20 MG tablet TAKE 1 AND ONE-HALF TABLETS (30MG  TOTAL) BY MOUTH DAILY. 05/07/18 04/14/20  [provider]    Family History Family History  Problem Relation Age of Onset   Cirrhosis Father    Alcohol abuse Brother     Intellectual disability Maternal Aunt    Heart disease Maternal Grandmother    Intellectual disability Son    Drug abuse Son     Social History Social History   Tobacco Use   Smoking status: Former    Current packs/day: 0.00    Average packs/day: 1 pack/day for 16.0 years (16.0 ttl pk-yrs)    Types: Cigarettes    Start date: 10/04/1979    Quit date: 10/04/1995    Years since quitting: 28.7   Smokeless tobacco: Never  Vaping Use   Vaping status: Never Used  Substance Use Topics   Alcohol use: No   Drug use: Not Currently    Types: Marijuana     Allergies   Patient has no known allergies.   Review of Systems Review of Systems  Constitutional:  Negative for chills and fever.  Eyes:  Negative for discharge and redness.  Respiratory:  Negative for shortness of breath.  Gastrointestinal:  Negative for abdominal pain, nausea and vomiting.  Musculoskeletal:  Positive for arthralgias.  Neurological:  Negative for numbness.     Physical Exam Triage Vital Signs ED Triage Vitals  Encounter Vitals Group     BP 06/10/24 1812 111/73     Girls Systolic BP Percentile --      Girls Diastolic BP Percentile --      Boys Systolic BP Percentile --      Boys Diastolic BP Percentile --      Pulse Rate 06/10/24 1812 83     Resp 06/10/24 1812 (!) 24     Temp 06/10/24 1812 97.9 F (36.6 C)     Temp Source 06/10/24 1812 Oral     SpO2 06/10/24 1812 92 %     Weight 06/10/24 1811 (!) 324 lb (147 kg)     Height 06/10/24 1811 5' 4 (1.626 m)     Head Circumference --      Peak Flow --      Pain Score 06/10/24 1806 8     Pain Loc --      Pain Education --      Exclude from Growth Chart --    No data found.  Updated Vital Signs BP 111/73 (BP Location: Left Arm)   Pulse 83   Temp 97.9 F (36.6 C) (Oral)   Resp (!) 24   Ht 5' 4 (1.626 m)   Wt (!) 324 lb (147 kg)   SpO2 92%   BMI 55.61 kg/m   Visual Acuity Right Eye Distance:   Left Eye Distance:   Bilateral Distance:     Right Eye Near:   Left Eye Near:    Bilateral Near:     Physical Exam Vitals and nursing note reviewed.  Constitutional:      General: She is not in acute distress.    Appearance: Normal appearance. She is not ill-appearing.  HENT:     Head: Normocephalic and atraumatic.  Eyes:     Conjunctiva/sclera: Conjunctivae normal.  Cardiovascular:     Rate and Rhythm: Normal rate.  Pulmonary:     Effort: Pulmonary effort is normal. No respiratory distress.  Musculoskeletal:     Comments: Decreased range of motion of left hip in all directions due to pain.  Neurological:     Mental Status: She is alert.  Psychiatric:        Mood and Affect: Mood normal.        Behavior: Behavior normal.        Thought Content: Thought content normal.      UC Treatments / Results  Labs (all labs ordered are listed, but only abnormal results are displayed) Labs Reviewed - No data to display  EKG   Radiology No results found.  Procedures Procedures (including critical care time)  Medications Ordered in UC Medications - No data to display  Initial Impression / Assessment and Plan / UC Course  I have reviewed the triage vital signs and the nursing notes.  Pertinent labs & imaging results that were available during my care of the patient were reviewed by me and considered in my medical decision making (see chart for details).     X-ray ordered without acute finding.  Will treat with steroid burst and advised follow-up with Ortho if symptoms persist. Patient is a known diabetic and advised her to monitor her glucose while taking steroids as this might elevate her glucose readings temporarily. Patient expressed understanding.  Final Clinical Impressions(s) / UC Diagnoses   Final diagnoses:  Left hip pain  Type 2 diabetes mellitus with diabetic neuropathy, unspecified whether long term insulin use [facility])   Discharge Instructions   None    ED Prescriptions     Medication Sig  Dispense Auth. Provider   predniSONE  (DELTASONE ) 20 MG tablet Take 2 tablets (40 mg total) by mouth daily with breakfast for 5 days. 10 tablet Billy Asberry FALCON, PA-C      PDMP not reviewed this encounter.   Billy Asberry FALCON, PA-C 06/16/24 1039

## 2024-06-11 ENCOUNTER — Telehealth (INDEPENDENT_AMBULATORY_CARE_PROVIDER_SITE_OTHER): Payer: Self-pay

## 2024-06-11 ENCOUNTER — Ambulatory Visit: Payer: Self-pay

## 2024-06-11 ENCOUNTER — Encounter (INDEPENDENT_AMBULATORY_CARE_PROVIDER_SITE_OTHER)

## 2024-06-11 NOTE — Telephone Encounter (Signed)
 Patient notified with medical recommendations and verbalized that she is not able to pull her compression sock up by herself. Patient states that she will come in next week appt for unna wrap.

## 2024-06-11 NOTE — Telephone Encounter (Signed)
 She could use compression socks

## 2024-06-11 NOTE — Telephone Encounter (Signed)
 Patient left a message stating that she removed her wraps on Friday due to sliding and causing pain. Patient is requesting if there is another option to use. Patient is not able to make to appt today due having hip pain. Please Advise

## 2024-06-17 NOTE — Telephone Encounter (Signed)
 Harlene,  Please call patient when you can.Thank you

## 2024-06-17 NOTE — Telephone Encounter (Signed)
 Patient left message stating that she is having really bad hip pain. She had spoken to [place] who suggested to speak to clinical before cancelling her unna boot appointments and follow up appointment. She is not able to handle the hip pain and being in the unna boot right. She asked in the message if provider is okay with her delay this for now until her hip pain is better.  Please advise.

## 2024-06-17 NOTE — Telephone Encounter (Signed)
 If she doesn't feel that she can handle hip pain while in an unna boot that is fine if she doesn't remain in them

## 2024-06-18 ENCOUNTER — Encounter (INDEPENDENT_AMBULATORY_CARE_PROVIDER_SITE_OTHER)

## 2024-06-25 ENCOUNTER — Encounter (INDEPENDENT_AMBULATORY_CARE_PROVIDER_SITE_OTHER)

## 2024-07-04 ENCOUNTER — Encounter (INDEPENDENT_AMBULATORY_CARE_PROVIDER_SITE_OTHER)

## 2024-07-04 ENCOUNTER — Ambulatory Visit (INDEPENDENT_AMBULATORY_CARE_PROVIDER_SITE_OTHER): Admitting: Nurse Practitioner

## 2024-10-01 ENCOUNTER — Telehealth

## 2024-10-01 DIAGNOSIS — R3989 Other symptoms and signs involving the genitourinary system: Secondary | ICD-10-CM | POA: Diagnosis not present

## 2024-10-01 MED ORDER — ONDANSETRON 4 MG PO TBDP: 4.0000 mg | ORAL_TABLET | Freq: Three times a day (TID) | ORAL | 1 refills | Status: DC | PRN

## 2024-10-01 MED ORDER — ONDANSETRON 4 MG PO TBDP: 4.0000 mg | ORAL_TABLET | Freq: Three times a day (TID) | ORAL | 0 refills | Status: DC | PRN

## 2024-10-01 MED ORDER — SULFAMETHOXAZOLE-TRIMETHOPRIM 800-160 MG PO TABS: 1.0000 | ORAL_TABLET | Freq: Two times a day (BID) | ORAL | 0 refills | Status: DC

## 2024-10-01 NOTE — Progress Notes (Signed)
 " Virtual Visit Consent   Maria Hancock, you are scheduled for a virtual visit with a [facility] provider today. Just as with appointments in the office, your consent must be obtained to participate. Your consent will be active for this visit and any virtual visit you may have with one of our providers in the next 365 days. If you have a MyChart account, a copy of this consent can be sent to you electronically.  As this is a virtual visit, video technology does not allow for your provider to perform a traditional examination. This may limit your provider's ability to fully assess your condition. If your provider identifies any concerns that need to be evaluated in person or the need to arrange testing (such as labs, EKG, etc.), we will make arrangements to do so. Although advances in technology are sophisticated, we cannot ensure that it will always work on either your end or our end. If the connection with a video visit is poor, the visit may have to be switched to a telephone visit. With either a video or telephone visit, we are not always able to ensure that we have a secure connection.  By engaging in this virtual visit, you consent to the provision of healthcare and authorize for your insurance to be billed (if applicable) for the services provided during this visit. Depending on your insurance coverage, you may receive a charge related to this service.  I need to obtain your verbal consent now. Are you willing to proceed with your visit today? Novella A Brabender has provided verbal consent on 10/01/2024 for a virtual visit (video or telephone). Maria Hancock, [place]  Date: 10/01/2024 10:31 AM   Virtual Visit via Video Note   I, Maria Hancock, connected with  Ulyssa A Rasp  ([id number], 10-10-1965) on 10/01/2024 at 10:30 AM EST by a video-enabled telemedicine application and verified that I am speaking with the correct person using two identifiers.  Location: Patient: Virtual  Visit Location Patient: Home Provider: Virtual Visit Location Provider: Home Office   I discussed the limitations of evaluation and management by telemedicine and the availability of in person appointments. The patient expressed understanding and agreed to proceed.    History of Present Illness: Maria Hancock is a 58 y.o. who identifies as a female who was assigned female at birth, and is being seen today for possible UTI. Endorses symptom onset right before Christmas (5 days ago) with mild dysuria, suprapubic pressure/pain. Last night she began noting with some bilateral back pain. Denies fever, vomiting. Some chills at night only.   HPI: HPI  Problems:  Patient Active Problem List   Diagnosis Date Noted   Lower limb ulcer, calf, right, limited to breakdown of skin (HCC) 06/04/2024   Cellulitis 05/13/2024   Iron deficiency 04/03/2024   Swelling of limb 04/02/2024   Generalized anxiety disorder 02/20/2024   Breast pain, left 02/06/2024   Increased frequency of urination 01/15/2024   Recurrent major depressive disorder, in full remission 10/20/2023   Eye discharge 07/12/2023   Pedal edema 05/22/2023   Other long term (current) drug therapy 03/08/2023   Mouth ulcers 02/22/2023   Acute cough 12/07/2022   High risk medication use 11/24/2022   MDD (major depressive disorder), recurrent episode, moderate (HCC) 07/13/2022   GAD (generalized anxiety disorder) 07/13/2022   Social anxiety disorder 07/13/2022   Bereavement 07/13/2022   At risk for prolonged QT interval syndrome 07/13/2022   Shortness of breath 05/11/2022   Impingement  syndrome of right shoulder 03/31/2022   Neuropathy of lower extremity 03/03/2022   Carpal tunnel syndrome, right upper limb 12/13/2021   Chronic low back pain 10/27/2021   Hx of total knee replacement, left 10/27/2021   Routine history and physical examination of adult 10/27/2021   Unspecified osteoarthritis, unspecified site 09/18/2021    Hypothyroidism, unspecified 07/01/2021   Hyperlipidemia 07/01/2021   Uterine bleeding 05/19/2021   Type 2 diabetes mellitus (HCC) 01/08/2021   Hypertensive disorder 01/08/2021   Neuropathy 01/08/2021   Other chest pain 10/09/2020   Joint pain 12/14/2019   Morbid obesity (HCC) 05/31/2018   Abnormal results of thyroid function studies 05/18/2017   Daytime somnolence 05/18/2017   Dizziness 05/16/2017   Essential (primary) hypertension 01/27/2017   Menstrual disorder 10/27/2016   Diabetic peripheral neuropathy associated with type 2 diabetes mellitus (HCC) 08/15/2016   Psoriasis 08/15/2016   Major depressive disorder, recurrent, moderate (HCC) 08/15/2016   Major depression, recurrent, chronic 12/02/2015   Osteoarthritis of left knee 02/26/2013    Class: Diagnosis of    Allergies: Allergies[1] Medications: Current Medications[2]  Observations/Objective: Patient is well-developed, well-nourished in no acute distress.  Resting comfortably  at home.  Head is normocephalic, atraumatic.  No labored breathing.  Speech is clear and coherent with logical content.  Patient is alert and oriented at baseline.   Assessment and Plan: 1. Suspected UTI (Primary) - sulfamethoxazole -trimethoprim  (BACTRIM  DS) 800-160 MG tablet; Take 1 tablet by mouth 2 (two) times daily.  Dispense: 10 tablet; Refill: 0 - ondansetron  (ZOFRAN -ODT) 4 MG disintegrating tablet; Take 1 tablet (4 mg total) by mouth every 8 (eight) hours as needed for nausea or vomiting.  Dispense: 20 tablet; Refill: 0  Classic UTI symptoms with absence of alarm signs or symptoms. Prior history of UTI. Will treat empirically with Bactrim  for suspected uncomplicated cystitis. Supportive measures and OTC medications reviewed. Strict in-person evaluation precautions discussed.    Follow Up Instructions: I discussed the assessment and treatment plan with the patient. The patient was provided an opportunity to ask questions and all were  answered. The patient agreed with the plan and demonstrated an understanding of the instructions.  A copy of instructions were sent to the patient via MyChart unless otherwise noted below.   The patient was advised to call back or seek an in-person evaluation if the symptoms worsen or if the condition fails to improve as anticipated.    Maria Velma Lunger, PA-C    [1] No Known Allergies [2]  Current Outpatient Medications:    ondansetron  (ZOFRAN -ODT) 4 MG disintegrating tablet, Take 1 tablet (4 mg total) by mouth every 8 (eight) hours as needed for nausea or vomiting., Disp: 20 tablet, Rfl: 0   sulfamethoxazole -trimethoprim  (BACTRIM  DS) 800-160 MG tablet, Take 1 tablet by mouth 2 (two) times daily., Disp: 10 tablet, Rfl: 0   ascorbic acid (VITAMIN C) 500 MG tablet, Take 500 mg by mouth daily., Disp: , Rfl:    atorvastatin (LIPITOR) 40 MG tablet, Take 40 mg by mouth daily., Disp: , Rfl:    Blood Glucose Monitoring Suppl (ACCU-CHEK GUIDE) w/Device KIT, ACCU-CHEK GUIDE w/Device KIT, Disp: , Rfl:    desvenlafaxine  (PRISTIQ ) 100 MG 24 hr tablet, Take 1 tablet (100 mg total) by mouth daily., Disp: 30 tablet, Rfl: 0   Ferrous Sulfate (IRON) 325 (65 Fe) MG TABS, TAKE 1 TABLET BY MOUTH ONCE DAILY FOR IRON DEFICIENCY, Disp: , Rfl:    fluticasone  (FLONASE ) 50 MCG/ACT nasal spray, Place 2 sprays into both nostrils daily.,  Disp: 18.2 mL, Rfl: 11   naproxen  (NAPROSYN ) 500 MG tablet, Take 1 tablet (500 mg total) by mouth 2 (two) times daily with a meal., Disp: 30 tablet, Rfl: 0   Potassium 99 MG TABS, Take by mouth., Disp: , Rfl:    pregabalin (LYRICA) 100 MG capsule, Take 100 mg by mouth 3 (three) times daily., Disp: , Rfl:    propranolol  (INDERAL ) 10 MG tablet, Take 1 tablet (10 mg total) by mouth 2 (two) times daily as needed. For severe anxiety only, Disp: 60 tablet, Rfl: 0  "

## 2024-10-01 NOTE — Patient Instructions (Signed)
 " Maria Hancock, thank you for joining [name], PA-C for today's virtual visit.  While this provider is not your primary care provider (PCP), if your PCP is located in our provider database this encounter information will be shared with them immediately following your visit.   A [facility] MyChart account gives you access to today's visit and all your visits, tests, and labs performed at [facility]  click here if you don't have a [facility] MyChart account or go to [email or web address]  Consent: (Patient) Maria Hancock provided verbal consent for this virtual visit at the beginning of the encounter.  Current Medications:  Current Outpatient Medications:    ondansetron  (ZOFRAN -ODT) 4 MG disintegrating tablet, Take 1 tablet (4 mg total) by mouth every 8 (eight) hours as needed for nausea or vomiting., Disp: 20 tablet, Rfl: 0   sulfamethoxazole -trimethoprim  (BACTRIM  DS) 800-160 MG tablet, Take 1 tablet by mouth 2 (two) times daily., Disp: 10 tablet, Rfl: 0   ascorbic acid (VITAMIN C) 500 MG tablet, Take 500 mg by mouth daily., Disp: , Rfl:    atorvastatin (LIPITOR) 40 MG tablet, Take 40 mg by mouth daily., Disp: , Rfl:    Blood Glucose Monitoring Suppl (ACCU-CHEK GUIDE) w/Device KIT, ACCU-CHEK GUIDE w/Device KIT, Disp: , Rfl:    desvenlafaxine  (PRISTIQ ) 100 MG 24 hr tablet, Take 1 tablet (100 mg total) by mouth daily., Disp: 30 tablet, Rfl: 0   Ferrous Sulfate (IRON) 325 (65 Fe) MG TABS, TAKE 1 TABLET BY MOUTH ONCE DAILY FOR IRON DEFICIENCY, Disp: , Rfl:    fluticasone  (FLONASE ) 50 MCG/ACT nasal spray, Place 2 sprays into both nostrils daily., Disp: 18.2 mL, Rfl: 11   naproxen  (NAPROSYN ) 500 MG tablet, Take 1 tablet (500 mg total) by mouth 2 (two) times daily with a meal., Disp: 30 tablet, Rfl: 0   Potassium 99 MG TABS, Take by mouth., Disp: , Rfl:    pregabalin (LYRICA) 100 MG capsule, Take 100 mg by mouth 3 (three) times daily., Disp: , Rfl:     propranolol  (INDERAL ) 10 MG tablet, Take 1 tablet (10 mg total) by mouth 2 (two) times daily as needed. For severe anxiety only, Disp: 60 tablet, Rfl: 0   Medications ordered in this encounter:  Meds ordered this encounter  Medications   sulfamethoxazole -trimethoprim  (BACTRIM  DS) 800-160 MG tablet    Sig: Take 1 tablet by mouth 2 (two) times daily.    Dispense:  10 tablet    Refill:  0    Supervising Provider:   LAMPTEY, PHILIP O [[id number]]   ondansetron  (ZOFRAN -ODT) 4 MG disintegrating tablet    Sig: Take 1 tablet (4 mg total) by mouth every 8 (eight) hours as needed for nausea or vomiting.    Dispense:  20 tablet    Refill:  0    Supervising Provider:   LAMPTEY, PHILIP O [[id number]]     *If you need refills on other medications prior to your next appointment, please contact your pharmacy*  Follow-Up: Call back or seek an in-person evaluation if the symptoms worsen or if the condition fails to improve as anticipated.  [facility] [phone]  Other Instructions Your symptoms are consistent with a bladder infection, also called acute cystitis. Please take your antibiotic (Bactrim ) as directed until all pills are gone.  Stay very well hydrated.  Consider a daily probiotic (Align, Culturelle, or Activia) to help prevent stomach upset caused by the antibiotic.  Taking a probiotic daily may also help  prevent recurrent UTIs.  Also consider taking AZO (Phenazopyridine) tablets to help decrease pain with urination.    Urinary Tract Infection A urinary tract infection (UTI) can occur any place along the urinary tract. The tract includes the kidneys, ureters, bladder, and urethra. A type of germ called bacteria often causes a UTI. UTIs are often helped with antibiotic medicine.  HOME CARE  If given, take antibiotics as told by your doctor. Finish them even if you start to feel better. Drink enough fluids to keep your pee (urine) clear or pale yellow. Avoid tea, drinks with  caffeine, and bubbly (carbonated) drinks. Pee often. Avoid holding your pee in for a long time. Pee before and after having sex (intercourse). Wipe from front to back after you poop (bowel movement) if you are a woman. Use each tissue only once. GET HELP RIGHT AWAY IF:  You have back pain. You have lower belly (abdominal) pain. You have chills. You feel sick to your stomach (nauseous). You throw up (vomit). Your burning or discomfort with peeing does not go away. You have a fever. Your symptoms are not better in 3 days. MAKE SURE YOU:  Understand these instructions. Will watch your condition. Will get help right away if you are not doing well or get worse. Document Released: 03/07/2008 Document Revised: 06/13/2012 Document Reviewed: 04/19/2012 [facility] Patient Information 2015 [place], [place]. This information is not intended to replace advice given to you by your health care provider. Make sure you discuss any questions you have with your health care provider.    If you have been instructed to have an in-person evaluation today at a local Urgent Care facility, please use the link below. It will take you to a list of all of our available [facility], including address, phone number and hours of operation. Please do not delay care.  [facility]  If you or a family member do not have a primary care provider, use the link below to schedule a visit and establish care. When you choose a [facility] primary care physician or advanced practice provider, you gain a long-term partner in health. Find a Primary Care Provider  Learn more about [facility]'s in-office and virtual care options: [facility] - Get Care Now  "

## 2024-10-13 ENCOUNTER — Telehealth: Admitting: Family

## 2024-10-13 DIAGNOSIS — R110 Nausea: Secondary | ICD-10-CM | POA: Diagnosis not present

## 2024-10-13 DIAGNOSIS — R197 Diarrhea, unspecified: Secondary | ICD-10-CM | POA: Diagnosis not present

## 2024-10-13 MED ORDER — PROMETHAZINE HCL 12.5 MG PO TABS: 12.5000 mg | ORAL_TABLET | Freq: Three times a day (TID) | ORAL | 0 refills | Status: DC | PRN

## 2024-10-13 NOTE — Progress Notes (Signed)
 " Virtual Visit Consent   Maria Hancock, you are scheduled for a virtual visit with a [facility] provider today. Just as with appointments in the office, your consent must be obtained to participate. Your consent will be active for this visit and any virtual visit you may have with one of our providers in the next 365 days. If you have a MyChart account, a copy of this consent can be sent to you electronically.  As this is a virtual visit, video technology does not allow for your provider to perform a traditional examination. This may limit your provider's ability to fully assess your condition. If your provider identifies any concerns that need to be evaluated in person or the need to arrange testing (such as labs, EKG, etc.), we will make arrangements to do so. Although advances in technology are sophisticated, we cannot ensure that it will always work on either your end or our end. If the connection with a video visit is poor, the visit may have to be switched to a telephone visit. With either a video or telephone visit, we are not always able to ensure that we have a secure connection.  By engaging in this virtual visit, you consent to the provision of healthcare and authorize for your insurance to be billed (if applicable) for the services provided during this visit. Depending on your insurance coverage, you may receive a charge related to this service.  I need to obtain your verbal consent now. Are you willing to proceed with your visit today? Maria Hancock has provided verbal consent on 10/13/2024 for a virtual visit (video or telephone). Maria Learn, FNP  Date: 10/13/2024 4:12 PM   Virtual Visit via Video Note   I, Maria Hancock, connected with  Maria Hancock  ([id number], January 16, 1966) on 10/13/2024 at  4:00 PM EST by a video-enabled telemedicine application and verified that I am speaking with the correct person using two identifiers.  Location: Patient: Virtual Visit Location  Patient: Home Provider: Virtual Visit Location Provider: Home Office   I discussed the limitations of evaluation and management by telemedicine and the availability of in person appointments. The patient expressed understanding and agreed to proceed.    History of Present Illness: Maria Hancock is a 59 y.o. who identifies as a female who was assigned female at birth, and is being seen today for diarrhea that started 4 days ago.  HPI: Diarrhea  This is a new problem. The current episode started in the past 7 days. The problem occurs 5 to 10 times per day. The problem has been unchanged. The stool consistency is described as Watery. Associated symptoms include abdominal pain, bloating and headaches. Pertinent negatives include no fever or vomiting. Associated symptoms comments: Nausea . She has tried increased fluids, bismuth subsalicylate and anti-motility drug for the symptoms. The treatment provided mild relief.    Problems:  Patient Active Problem List   Diagnosis Date Noted   Lower limb ulcer, calf, right, limited to breakdown of skin (HCC) 06/04/2024   Cellulitis 05/13/2024   Iron deficiency 04/03/2024   Swelling of limb 04/02/2024   Generalized anxiety disorder 02/20/2024   Breast pain, left 02/06/2024   Increased frequency of urination 01/15/2024   Recurrent major depressive disorder, in full remission 10/20/2023   Eye discharge 07/12/2023   Pedal edema 05/22/2023   Other long term (current) drug therapy 03/08/2023   Mouth ulcers 02/22/2023   Acute cough 12/07/2022   High risk medication use 11/24/2022  MDD (major depressive disorder), recurrent episode, moderate (HCC) 07/13/2022   GAD (generalized anxiety disorder) 07/13/2022   Social anxiety disorder 07/13/2022   Bereavement 07/13/2022   At risk for prolonged QT interval syndrome 07/13/2022   Shortness of breath 05/11/2022   Impingement syndrome of right shoulder 03/31/2022   Neuropathy of lower extremity 03/03/2022    Carpal tunnel syndrome, right upper limb 12/13/2021   Chronic low back pain 10/27/2021   Hx of total knee replacement, left 10/27/2021   Routine history and physical examination of adult 10/27/2021   Unspecified osteoarthritis, unspecified site 09/18/2021   Hypothyroidism, unspecified 07/01/2021   Hyperlipidemia 07/01/2021   Uterine bleeding 05/19/2021   Type 2 diabetes mellitus (HCC) 01/08/2021   Hypertensive disorder 01/08/2021   Neuropathy 01/08/2021   Other chest pain 10/09/2020   Joint pain 12/14/2019   Morbid obesity (HCC) 05/31/2018   Abnormal results of thyroid function studies 05/18/2017   Daytime somnolence 05/18/2017   Dizziness 05/16/2017   Essential (primary) hypertension 01/27/2017   Menstrual disorder 10/27/2016   Diabetic peripheral neuropathy associated with type 2 diabetes mellitus (HCC) 08/15/2016   Psoriasis 08/15/2016   Major depressive disorder, recurrent, moderate (HCC) 08/15/2016   Major depression, recurrent, chronic 12/02/2015   Osteoarthritis of left knee 02/26/2013    Class: Diagnosis of    Allergies: Allergies[1] Medications: Current Medications[2]  Observations/Objective: Patient is well-developed, well-nourished in no acute distress.  Resting comfortably  at home.  Head is normocephalic, atraumatic.  No labored breathing.  Speech is clear and coherent with logical content.  Patient is alert and oriented at baseline.    Assessment and Plan: 1. Diarrhea, unspecified type (Primary)  2. Nausea - promethazine  (PHENERGAN ) 12.5 MG tablet; Take 1 tablet (12.5 mg total) by mouth every 8 (eight) hours as needed for nausea or vomiting.  Dispense: 20 tablet; Refill: 0  Given diarrhea has been on going for 4 days, recommend face to face visit for further testing to rule out c diff.  Force fluids K+ rich diet Phenergan  Prescription sent to pharmacy, can not tolerate zofran   Work note given  Follow up in person tomorrow at urgent care or  PCP  Follow Up Instructions: I discussed the assessment and treatment plan with the patient. The patient was provided an opportunity to ask questions and all were answered. The patient agreed with the plan and demonstrated an understanding of the instructions.  A copy of instructions were sent to the patient via MyChart unless otherwise noted below.     The patient was advised to call back or seek an in-person evaluation if the symptoms worsen or if the condition fails to improve as anticipated.    Maria Learn, FNP    [1] No Known Allergies [2]  Current Outpatient Medications:    promethazine  (PHENERGAN ) 12.5 MG tablet, Take 1 tablet (12.5 mg total) by mouth every 8 (eight) hours as needed for nausea or vomiting., Disp: 20 tablet, Rfl: 0   ascorbic acid (VITAMIN C) 500 MG tablet, Take 500 mg by mouth daily., Disp: , Rfl:    atorvastatin (LIPITOR) 40 MG tablet, Take 40 mg by mouth daily., Disp: , Rfl:    Blood Glucose Monitoring Suppl (ACCU-CHEK GUIDE) w/Device KIT, ACCU-CHEK GUIDE w/Device KIT, Disp: , Rfl:    desvenlafaxine  (PRISTIQ ) 100 MG 24 hr tablet, Take 1 tablet (100 mg total) by mouth daily., Disp: 30 tablet, Rfl: 0   Ferrous Sulfate (IRON) 325 (65 Fe) MG TABS, TAKE 1 TABLET BY MOUTH ONCE DAILY FOR  IRON DEFICIENCY, Disp: , Rfl:    fluticasone  (FLONASE ) 50 MCG/ACT nasal spray, Place 2 sprays into both nostrils daily., Disp: 18.2 mL, Rfl: 11   naproxen  (NAPROSYN ) 500 MG tablet, Take 1 tablet (500 mg total) by mouth 2 (two) times daily with a meal., Disp: 30 tablet, Rfl: 0   ondansetron  (ZOFRAN -ODT) 4 MG disintegrating tablet, Take 1 tablet (4 mg total) by mouth every 8 (eight) hours as needed for nausea or vomiting., Disp: 20 tablet, Rfl: 1   Potassium 99 MG TABS, Take by mouth., Disp: , Rfl:    pregabalin (LYRICA) 100 MG capsule, Take 100 mg by mouth 3 (three) times daily., Disp: , Rfl:    propranolol  (INDERAL ) 10 MG tablet, Take 1 tablet (10 mg total) by mouth 2 (two) times daily  as needed. For severe anxiety only, Disp: 60 tablet, Rfl: 0   sulfamethoxazole -trimethoprim  (BACTRIM  DS) 800-160 MG tablet, Take 1 tablet by mouth 2 (two) times daily., Disp: 10 tablet, Rfl: 0  "

## 2024-10-13 NOTE — Patient Instructions (Signed)
 Diarrhea, Adult Diarrhea is frequent loose and sometimes watery bowel movements. Diarrhea can make you feel weak and cause you to become dehydrated. Dehydration is a condition in which there is not enough water or other fluids in the body. Dehydration can make you tired and thirsty, cause you to have a dry mouth, and decrease how often you urinate. Diarrhea typically lasts 2-3 days. However, it can last longer if it is a sign of something more serious. It is important to treat your diarrhea as told by your health care provider. Follow these instructions at home: Eating and drinking     Follow these recommendations as told by your health care provider: Take an oral rehydration solution (ORS). This is an over-the-counter medicine that helps return your body to its normal balance of nutrients and water. It is found at pharmacies and retail stores. Drink enough fluid to keep your urine pale yellow. Drink fluids such as water, diluted fruit juice, and low-calorie sports drinks. You can drink milk also, if desired. Sucking on ice chips is another way to get fluids. Avoid drinking fluids that contain a lot of sugar or caffeine, such as soda, energy drinks, and regular sports drinks. Avoid alcohol. Eat bland, easy-to-digest foods in small amounts as you are able. These foods include bananas, applesauce, rice, lean meats, toast, and crackers. Avoid spicy or fatty foods.  Medicines Take over-the-counter and prescription medicines only as told by your health care provider. If you were prescribed antibiotics, take them as told by your health care provider. Do not stop using the antibiotic even if you start to feel better. General instructions  Wash your hands often using soap and water for at least 20 seconds. If soap and water are not available, use hand sanitizer. Others in the household should wash their hands as well. Hands should be washed: After using the toilet or changing a diaper. Before  preparing, cooking, or serving food. While caring for a sick person or while visiting someone in a hospital. Rest at home while you recover. Take a warm bath to relieve any burning or pain from frequent diarrhea episodes. Watch your condition for any changes. Contact a health care provider if: You have a fever. Your diarrhea gets worse. You have new symptoms. You vomit every time you eat or drink. You feel light-headed, dizzy, or have a headache. You have muscle cramps. You have signs of dehydration, such as: Dark urine, very little urine, or no urine. Cracked lips. Dry mouth. Sunken eyes. Sleepiness. Weakness. You have bloody or black stools or stools that look like tar. You have severe pain, cramping, or bloating in your abdomen. Your skin feels cold and clammy. You feel confused. Get help right away if: You have chest pain or your heart is beating very quickly. You have trouble breathing or you are breathing very quickly. You feel extremely weak or you faint. These symptoms may be an emergency. Get help right away. Call 911. Do not wait to see if the symptoms will go away. Do not drive yourself to the hospital. This information is not intended to replace advice given to you by your health care provider. Make sure you discuss any questions you have with your health care provider. Document Revised: 03/08/2022 Document Reviewed: 03/08/2022 Elsevier Patient Education  2024 [employer].

## 2024-10-22 ENCOUNTER — Telehealth: Admitting: Physician Assistant

## 2024-10-22 DIAGNOSIS — M2550 Pain in unspecified joint: Secondary | ICD-10-CM

## 2024-10-22 MED ORDER — METHYLPREDNISOLONE 4 MG PO TBPK: ORAL_TABLET | ORAL | 0 refills | Status: AC

## 2024-10-22 NOTE — Patient Instructions (Signed)
 " Maria Hancock, thank you for joining Delon CHRISTELLA Dickinson, PA-C for today's virtual visit.  While this provider is not your primary care provider (PCP), if your PCP is located in our provider database this encounter information will be shared with them immediately following your visit.   A [facility] MyChart account gives you access to today's visit and all your visits, tests, and labs performed at [facility]  click here if you don't have a [facility] MyChart account or go to [email or web address]  Consent: (Patient) Maria Hancock provided verbal consent for this virtual visit at the beginning of the encounter.  Current Medications:  Current Outpatient Medications:    furosemide (LASIX) 20 MG tablet, Take 20 mg by mouth daily., Disp: , Rfl:    JARDIANCE 10 MG TABS tablet, Take 10 mg by mouth daily., Disp: , Rfl:    losartan (COZAAR) 25 MG tablet, Take 25 mg by mouth daily. for high blood pressure, Disp: , Rfl:    metFORMIN  (GLUCOPHAGE ) 1000 MG tablet, Take 1,000 mg by mouth 2 (two) times daily., Disp: , Rfl:    methylPREDNISolone  (MEDROL  DOSEPAK) 4 MG TBPK tablet, 6 day taper; take as directed on package instructions, Disp: 21 tablet, Rfl: 0   oxybutynin (DITROPAN-XL) 5 MG 24 hr tablet, Take 5 mg by mouth at bedtime., Disp: , Rfl:    pregabalin (LYRICA) 200 MG capsule, Take 200 mg by mouth in the morning, at noon, and at bedtime., Disp: , Rfl:    atorvastatin (LIPITOR) 40 MG tablet, Take 40 mg by mouth daily., Disp: , Rfl:    Blood Glucose Monitoring Suppl (ACCU-CHEK GUIDE) w/Device KIT, ACCU-CHEK GUIDE w/Device KIT, Disp: , Rfl:    desvenlafaxine  (PRISTIQ ) 100 MG 24 hr tablet, Take 1 tablet (100 mg total) by mouth daily., Disp: 30 tablet, Rfl: 0   levothyroxine (SYNTHROID) 50 MCG tablet, Take 50 mcg by mouth daily., Disp: , Rfl:    naproxen  (NAPROSYN ) 500 MG tablet, Take 1 tablet (500 mg total) by mouth 2 (two) times daily with a meal., Disp: 30 tablet, Rfl: 0    propranolol  (INDERAL ) 10 MG tablet, Take 1 tablet (10 mg total) by mouth 2 (two) times daily as needed. For severe anxiety only, Disp: 60 tablet, Rfl: 0   Medications ordered in this encounter:  Meds ordered this encounter  Medications   methylPREDNISolone  (MEDROL  DOSEPAK) 4 MG TBPK tablet    Sig: 6 day taper; take as directed on package instructions    Dispense:  21 tablet    Refill:  0    Supervising Provider:   BLAISE ALEENE KIDD [[id number]]     *If you need refills on other medications prior to your next appointment, please contact your pharmacy*  Follow-Up: Call back or seek an in-person evaluation if the symptoms worsen or if the condition fails to improve as anticipated.  [facility] [phone]  Other Instructions Joint Pain  Joint pain can be caused by many things. It may go away if you follow instructions from your health care provider for taking care of yourself at home. Sometimes, you may need more treatment. Joint pain can be caused by: Bruises at the area of the joint. An injury caused by movements that are repeated. Wear and tear on the joint as you get older. Buildup of uric acid crystals in the joint. This is also called gout. Irritation and swelling of the joint. Types of arthritis. Infections of the joint or of the bone.  Your provider may tell you to take pain medicine or wear an elastic bandage, sling, or splint. If your joint pain continues, you may need lab or imaging tests to find the cause of your joint pain. Follow these instructions at home: If you have an elastic bandage, sling, or splint that can be taken off: Wear the bandage, sling, or splint as told by your provider. Take it off only if your provider says you can. Check the skin under and around it every day. Tell your provider if you see problems. Loosen it if your fingers or toes tingle, are numb, or turn cold and blue. Keep it clean and dry. Ask your provider if you should remove it  before bathing. If the bandage, sling, or splint is not waterproof: Do not let it get wet. Cover it when you take a bath or shower. Use a cover that does not let any water in. Managing pain, stiffness, and swelling     If told, put ice on the area. If you have an elastic bandage, sling, or splint that you can take off, remove it as told. Put ice in a plastic bag. Place a towel between your skin and the bag. Leave the ice on for 20 minutes, 2-3 times a day. If told, put heat on the area. Do this as often as told. Use the heat source that your provider recommends, such as a moist heat pack or a heating pad. Place a towel between your skin and the heat source. Leave the heat on for 20-30 minutes. If your skin turns bright red, take off the ice or heat right away to prevent skin damage. The risk of damage is higher if you can't feel pain, heat, or cold. Move your fingers or toes often to reduce stiffness and swelling. Raise the injured area above the level of your heart while you're sitting or lying down. Use a pillow to support the painful area as needed. Activity Rest the painful joint as told. Do not do things that cause pain or make pain worse. Begin exercising or stretching the affected area as told by your provider. Return to normal activities when you are told. Ask what things are safe for you to do. General instructions Take your medicines as told by your provider. Treatment may include medicines for pain and swelling that are taken by mouth or applied to the skin. Do not smoke, vape, or use products with nicotine or tobacco in them. If you need help quitting, talk with your provider. Keep all follow-up visits. Your provider will want to check on your condition. Contact a health care provider if: You have pain that does not get better with medicine. Your joint pain does not improve within 3 days. You have more bruising or swelling. You have a fever. You lose 10 lb (4.5 kg) or  more without trying. Get help right away if: You cannot move the joint. Your fingers or toes tingle, become numb, or turn cold and blue. You have a fever along with a joint that's red, warm, and swollen. This information is not intended to replace advice given to you by your health care provider. Make sure you discuss any questions you have with your health care provider. Document Revised: 06/22/2023 Document Reviewed: 12/02/2022 Elsevier Patient Education  2024 [employer]   If you have been instructed to have an in-person evaluation today at a local Urgent Care facility, please use the link below. It will take you to a list of  all of our available [facility], including address, phone number and hours of operation. Please do not delay care.  [facility]  If you or a family member do not have a primary care provider, use the link below to schedule a visit and establish care. When you choose a [facility] primary care physician or advanced practice provider, you gain a long-term partner in health. Find a Primary Care Provider  Learn more about [facility]'s in-office and virtual care options: [facility] - Get Care Now "

## 2024-10-22 NOTE — Progress Notes (Signed)
 " Virtual Visit Consent   Maria Hancock, you are scheduled for a virtual visit with a [facility] provider today. Just as with appointments in the office, your consent must be obtained to participate. Your consent will be active for this visit and any virtual visit you may have with one of our providers in the next 365 days. If you have a MyChart account, a copy of this consent can be sent to you electronically.  As this is a virtual visit, video technology does not allow for your provider to perform a traditional examination. This may limit your provider's ability to fully assess your condition. If your provider identifies any concerns that need to be evaluated in person or the need to arrange testing (such as labs, EKG, etc.), we will make arrangements to do so. Although advances in technology are sophisticated, we cannot ensure that it will always work on either your end or our end. If the connection with a video visit is poor, the visit may have to be switched to a telephone visit. With either a video or telephone visit, we are not always able to ensure that we have a secure connection.  By engaging in this virtual visit, you consent to the provision of healthcare and authorize for your insurance to be billed (if applicable) for the services provided during this visit. Depending on your insurance coverage, you may receive a charge related to this service.  I need to obtain your verbal consent now. Are you willing to proceed with your visit today? Maria Hancock has provided verbal consent on 10/22/2024 for a virtual visit (video or telephone). Maria CHRISTELLA Dickinson, PA-C  Date: 10/22/2024 10:30 AM   Virtual Visit via Video Note   I, Maria Hancock, connected with  Maria Hancock  ([id number], May 29, 1966) on 10/22/24 at 10:00 AM EST by a video-enabled telemedicine application and verified that I am speaking with the correct person using two identifiers.  Location: Patient: Virtual  Visit Location Patient: Mobile Provider: Virtual Visit Location Provider: Home Office   I discussed the limitations of evaluation and management by telemedicine and the availability of in person appointments. The patient expressed understanding and agreed to proceed.    History of Present Illness: Maria Hancock is a 59 y.o. who identifies as a female who was assigned female at birth, and is being seen today for multiple joint pains and overall pain. Reports it was worse this morning and did not want to move due to pain. Reports she has a history of arthritis and feels it is worsening. Takes 4 aleve  every night, 3 tylenol  arthritis in the morning and at lunch. Takes Lyrica 200mg  TID for nerve pain from Diabetes.   Did just have Prednisone  taper for hip pain in Sept 2025.  Problems:  Patient Active Problem List   Diagnosis Date Noted   Lower limb ulcer, calf, right, limited to breakdown of skin (HCC) 06/04/2024   Cellulitis 05/13/2024   Iron deficiency 04/03/2024   Swelling of limb 04/02/2024   Generalized anxiety disorder 02/20/2024   Breast pain, left 02/06/2024   Increased frequency of urination 01/15/2024   Recurrent major depressive disorder, in full remission 10/20/2023   Eye discharge 07/12/2023   Pedal edema 05/22/2023   Other long term (current) drug therapy 03/08/2023   Mouth ulcers 02/22/2023   Acute cough 12/07/2022   High risk medication use 11/24/2022   MDD (major depressive disorder), recurrent episode, moderate (HCC) 07/13/2022   GAD (generalized anxiety  disorder) 07/13/2022   Social anxiety disorder 07/13/2022   Bereavement 07/13/2022   At risk for prolonged QT interval syndrome 07/13/2022   Shortness of breath 05/11/2022   Impingement syndrome of right shoulder 03/31/2022   Neuropathy of lower extremity 03/03/2022   Carpal tunnel syndrome, right upper limb 12/13/2021   Chronic low back pain 10/27/2021   Hx of total knee replacement, left 10/27/2021   Routine  history and physical examination of adult 10/27/2021   Unspecified osteoarthritis, unspecified site 09/18/2021   Hypothyroidism, unspecified 07/01/2021   Hyperlipidemia 07/01/2021   Uterine bleeding 05/19/2021   Type 2 diabetes mellitus (HCC) 01/08/2021   Hypertensive disorder 01/08/2021   Neuropathy 01/08/2021   Other chest pain 10/09/2020   Joint pain 12/14/2019   Morbid obesity (HCC) 05/31/2018   Abnormal results of thyroid function studies 05/18/2017   Daytime somnolence 05/18/2017   Dizziness 05/16/2017   Essential (primary) hypertension 01/27/2017   Menstrual disorder 10/27/2016   Diabetic peripheral neuropathy associated with type 2 diabetes mellitus (HCC) 08/15/2016   Psoriasis 08/15/2016   Major depressive disorder, recurrent, moderate (HCC) 08/15/2016   Major depression, recurrent, chronic 12/02/2015   Osteoarthritis of left knee 02/26/2013    Class: Diagnosis of    Allergies: Allergies[1] Medications: Current Medications[2]  Observations/Objective: Patient is well-developed, well-nourished in no acute distress.  Resting comfortably Head is normocephalic, atraumatic.  No labored breathing.  Speech is clear and coherent with logical content.  Patient is alert and oriented at baseline.    Assessment and Plan: 1. Arthralgia, unspecified joint (Primary) - methylPREDNISolone  (MEDROL  DOSEPAK) 4 MG TBPK tablet; 6 day taper; take as directed on package instructions  Dispense: 21 tablet; Refill: 0  - Multiple joint arthralgia - DDx: arthritis flare, RA, auto-immune process, fibromyalgia/myofascial pain syndrome - Will add Medrol  acutely for pain; advised to monitor blood glucose closely with steroids, stop steroid if over 300 consistently - Advised to not take Aleve , or other NSAIDs, while on steroid - Okay to continue Tylenol  and Lyrica as prescribed - Discussed importance of follow up for chronic pain to potentially find source and better management since she is max  daily dose of OTC options (worry for her kidneys, stomach, and liver long term with these doses) - Warm compresses - Epsom salt soaks if she feels comfortable and able - Seek in person evaluation if worsening  Follow Up Instructions: I discussed the assessment and treatment plan with the patient. The patient was provided an opportunity to ask questions and all were answered. The patient agreed with the plan and demonstrated an understanding of the instructions.  A copy of instructions were sent to the patient via MyChart unless otherwise noted below.    The patient was advised to call back or seek an in-person evaluation if the symptoms worsen or if the condition fails to improve as anticipated.    Maria HERO Guinevere Stephenson, PA-C     [1] No Known Allergies [2]  Current Outpatient Medications:    furosemide (LASIX) 20 MG tablet, Take 20 mg by mouth daily., Disp: , Rfl:    JARDIANCE 10 MG TABS tablet, Take 10 mg by mouth daily., Disp: , Rfl:    losartan (COZAAR) 25 MG tablet, Take 25 mg by mouth daily. for high blood pressure, Disp: , Rfl:    metFORMIN  (GLUCOPHAGE ) 1000 MG tablet, Take 1,000 mg by mouth 2 (two) times daily., Disp: , Rfl:    methylPREDNISolone  (MEDROL  DOSEPAK) 4 MG TBPK tablet, 6 day taper; take as directed on  package instructions, Disp: 21 tablet, Rfl: 0   oxybutynin (DITROPAN-XL) 5 MG 24 hr tablet, Take 5 mg by mouth at bedtime., Disp: , Rfl:    pregabalin (LYRICA) 200 MG capsule, Take 200 mg by mouth in the morning, at noon, and at bedtime., Disp: , Rfl:    atorvastatin (LIPITOR) 40 MG tablet, Take 40 mg by mouth daily., Disp: , Rfl:    Blood Glucose Monitoring Suppl (ACCU-CHEK GUIDE) w/Device KIT, ACCU-CHEK GUIDE w/Device KIT, Disp: , Rfl:    desvenlafaxine  (PRISTIQ ) 100 MG 24 hr tablet, Take 1 tablet (100 mg total) by mouth daily., Disp: 30 tablet, Rfl: 0   levothyroxine (SYNTHROID) 50 MCG tablet, Take 50 mcg by mouth daily., Disp: , Rfl:    naproxen  (NAPROSYN ) 500 MG  tablet, Take 1 tablet (500 mg total) by mouth 2 (two) times daily with a meal., Disp: 30 tablet, Rfl: 0   propranolol  (INDERAL ) 10 MG tablet, Take 1 tablet (10 mg total) by mouth 2 (two) times daily as needed. For severe anxiety only, Disp: 60 tablet, Rfl: 0  "
